# Patient Record
Sex: Female | Born: 1935
Health system: Southern US, Community
[De-identification: ages and names within clinical notes are randomized; demographics above are authoritative.]

## PROBLEM LIST (undated history)

## (undated) DIAGNOSIS — M1990 Unspecified osteoarthritis, unspecified site: Secondary | ICD-10-CM

## (undated) DIAGNOSIS — K5903 Drug induced constipation: Secondary | ICD-10-CM

## (undated) DIAGNOSIS — R0600 Dyspnea, unspecified: Secondary | ICD-10-CM

## (undated) DIAGNOSIS — C3490 Malignant neoplasm of unspecified part of unspecified bronchus or lung: Secondary | ICD-10-CM

## (undated) DIAGNOSIS — M810 Age-related osteoporosis without current pathological fracture: Secondary | ICD-10-CM

## (undated) DIAGNOSIS — D496 Neoplasm of unspecified behavior of brain: Secondary | ICD-10-CM

## (undated) DIAGNOSIS — D351 Benign neoplasm of parathyroid gland: Secondary | ICD-10-CM

## (undated) DIAGNOSIS — T402X5A Adverse effect of other opioids, initial encounter: Secondary | ICD-10-CM

## (undated) DIAGNOSIS — M8580 Other specified disorders of bone density and structure, unspecified site: Secondary | ICD-10-CM

## (undated) DIAGNOSIS — I10 Essential (primary) hypertension: Secondary | ICD-10-CM

## (undated) HISTORY — DX: Age-related osteoporosis without current pathological fracture: M81.0

## (undated) HISTORY — PX: GALLBLADDER SURGERY: SHX652

## (undated) HISTORY — DX: Benign neoplasm of parathyroid gland: D35.1

## (undated) HISTORY — DX: Essential (primary) hypertension: I10

## (undated) HISTORY — DX: Other specified disorders of bone density and structure, unspecified site: M85.80

## (undated) HISTORY — PX: WRIST FRACTURE SURGERY: SHX121

## (undated) HISTORY — DX: Unspecified osteoarthritis, unspecified site: M19.90

## (undated) HISTORY — DX: Neoplasm of unspecified behavior of brain: D49.6

## (undated) HISTORY — PX: LOBECTOMY: SHX5089

## (undated) HISTORY — PX: OTHER SURGICAL HISTORY: SHX169

## (undated) HISTORY — DX: Malignant neoplasm of unspecified part of unspecified bronchus or lung: C34.90

## (undated) HISTORY — PX: CATARACT EXTRACTION: SUR2

## (undated) HISTORY — PX: TONSILLECTOMY: SUR1361

---

## 2012-12-24 DIAGNOSIS — G4700 Insomnia, unspecified: Secondary | ICD-10-CM | POA: Insufficient documentation

## 2012-12-24 DIAGNOSIS — G4733 Obstructive sleep apnea (adult) (pediatric): Secondary | ICD-10-CM | POA: Insufficient documentation

## 2014-11-26 DIAGNOSIS — F09 Unspecified mental disorder due to known physiological condition: Secondary | ICD-10-CM | POA: Insufficient documentation

## 2014-11-26 DIAGNOSIS — Z90710 Acquired absence of both cervix and uterus: Secondary | ICD-10-CM | POA: Insufficient documentation

## 2014-11-26 DIAGNOSIS — G2581 Restless legs syndrome: Secondary | ICD-10-CM | POA: Insufficient documentation

## 2014-11-26 DIAGNOSIS — M8580 Other specified disorders of bone density and structure, unspecified site: Secondary | ICD-10-CM | POA: Insufficient documentation

## 2014-11-26 DIAGNOSIS — Z9079 Acquired absence of other genital organ(s): Secondary | ICD-10-CM | POA: Insufficient documentation

## 2015-11-10 DIAGNOSIS — M0638 Rheumatoid nodule, vertebrae: Secondary | ICD-10-CM | POA: Insufficient documentation

## 2016-07-18 DIAGNOSIS — E210 Primary hyperparathyroidism: Secondary | ICD-10-CM | POA: Insufficient documentation

## 2016-09-22 DIAGNOSIS — L821 Other seborrheic keratosis: Secondary | ICD-10-CM | POA: Insufficient documentation

## 2016-09-22 DIAGNOSIS — Z1283 Encounter for screening for malignant neoplasm of skin: Secondary | ICD-10-CM | POA: Insufficient documentation

## 2016-09-22 DIAGNOSIS — D229 Melanocytic nevi, unspecified: Secondary | ICD-10-CM | POA: Insufficient documentation

## 2017-03-16 DIAGNOSIS — L988 Other specified disorders of the skin and subcutaneous tissue: Secondary | ICD-10-CM | POA: Insufficient documentation

## 2017-04-12 DIAGNOSIS — E8941 Symptomatic postprocedural ovarian failure: Secondary | ICD-10-CM | POA: Insufficient documentation

## 2017-11-29 DIAGNOSIS — M1990 Unspecified osteoarthritis, unspecified site: Secondary | ICD-10-CM | POA: Insufficient documentation

## 2018-07-26 DIAGNOSIS — H903 Sensorineural hearing loss, bilateral: Secondary | ICD-10-CM | POA: Insufficient documentation

## 2019-06-10 DIAGNOSIS — R911 Solitary pulmonary nodule: Secondary | ICD-10-CM | POA: Insufficient documentation

## 2019-06-25 DIAGNOSIS — E785 Hyperlipidemia, unspecified: Secondary | ICD-10-CM | POA: Insufficient documentation

## 2019-06-25 DIAGNOSIS — E042 Nontoxic multinodular goiter: Secondary | ICD-10-CM | POA: Insufficient documentation

## 2019-07-14 DIAGNOSIS — K5903 Drug induced constipation: Secondary | ICD-10-CM | POA: Insufficient documentation

## 2020-03-12 LAB — LIPID PANEL
Cholesterol: 149 (ref 0–200)
HDL: 74 — AB (ref 35–70)
LDL Cholesterol: 61
Triglycerides: 74 (ref 40–160)

## 2020-03-12 LAB — CBC AND DIFFERENTIAL
HCT: 38 (ref 36–46)
Hemoglobin: 12.5 (ref 12.0–16.0)
Platelets: 152 (ref 150–399)
WBC: 4

## 2020-03-12 LAB — BASIC METABOLIC PANEL
BUN: 20 (ref 4–21)
CO2: 27 — AB (ref 13–22)
Chloride: 103 (ref 99–108)
Creatinine: 0.7 (ref 0.5–1.1)
Glucose: 179

## 2020-03-12 LAB — COMPREHENSIVE METABOLIC PANEL
Albumin: 4.3 (ref 3.5–5.0)
Calcium: 9.1 (ref 8.7–10.7)
GFR calc Af Amer: 83
GFR calc non Af Amer: 72
Globulin: 2.2

## 2020-03-12 LAB — HEPATIC FUNCTION PANEL
ALT: 18 (ref 7–35)
AST: 21 (ref 13–35)
Alkaline Phosphatase: 61 (ref 25–125)

## 2020-03-12 LAB — CBC: RBC: 4.19 (ref 3.87–5.11)

## 2020-08-17 ENCOUNTER — Ambulatory Visit: Payer: Self-pay | Attending: Internal Medicine

## 2020-08-17 DIAGNOSIS — Z23 Encounter for immunization: Secondary | ICD-10-CM

## 2020-08-17 NOTE — Progress Notes (Signed)
   Covid-19 Vaccination Clinic  Name:  Jacqueline Hunt    MRN: [id number] DOB: Jan 29, 1928  08/17/2020  [name] was observed post Covid-19 immunization for 15 minutes without incident. She was provided with Vaccine Information Sheet and instruction to access the V-Safe system.   [name] was instructed to call 911 with any severe reactions post vaccine: Marland Kitchen Difficulty breathing  . Swelling of face and throat  . A fast heartbeat  . A bad rash all over body  . Dizziness and weakness

## 2020-08-30 ENCOUNTER — Ambulatory Visit (INDEPENDENT_AMBULATORY_CARE_PROVIDER_SITE_OTHER): Payer: Medicare HMO | Admitting: Internal Medicine

## 2020-08-30 ENCOUNTER — Encounter: Payer: Self-pay | Admitting: Internal Medicine

## 2020-08-30 ENCOUNTER — Other Ambulatory Visit: Payer: Self-pay

## 2020-08-30 VITALS — BP 122/78 | HR 79 | Temp 97.5°F | Ht 61.0 in | Wt 106.0 lb

## 2020-08-30 DIAGNOSIS — M810 Age-related osteoporosis without current pathological fracture: Secondary | ICD-10-CM

## 2020-08-30 DIAGNOSIS — Z86018 Personal history of other benign neoplasm: Secondary | ICD-10-CM | POA: Insufficient documentation

## 2020-08-30 DIAGNOSIS — Z85118 Personal history of other malignant neoplasm of bronchus and lung: Secondary | ICD-10-CM

## 2020-08-30 DIAGNOSIS — I10 Essential (primary) hypertension: Secondary | ICD-10-CM

## 2020-08-30 DIAGNOSIS — I700 Atherosclerosis of aorta: Secondary | ICD-10-CM | POA: Diagnosis not present

## 2020-08-30 DIAGNOSIS — M8949 Other hypertrophic osteoarthropathy, multiple sites: Secondary | ICD-10-CM

## 2020-08-30 DIAGNOSIS — H401131 Primary open-angle glaucoma, bilateral, mild stage: Secondary | ICD-10-CM

## 2020-08-30 DIAGNOSIS — R296 Repeated falls: Secondary | ICD-10-CM | POA: Diagnosis not present

## 2020-08-30 DIAGNOSIS — R2681 Unsteadiness on feet: Secondary | ICD-10-CM

## 2020-08-30 DIAGNOSIS — M159 Polyosteoarthritis, unspecified: Secondary | ICD-10-CM

## 2020-08-30 DIAGNOSIS — M150 Primary generalized (osteo)arthritis: Secondary | ICD-10-CM

## 2020-08-30 DIAGNOSIS — Z8781 Personal history of (healed) traumatic fracture: Secondary | ICD-10-CM | POA: Insufficient documentation

## 2020-08-30 MED ORDER — ESTRADIOL 0.05 MG/24HR TD PTWK: 0.0500 mg | MEDICATED_PATCH | TRANSDERMAL | 4 refills | Status: DC

## 2020-08-30 NOTE — Patient Instructions (Signed)
Please bring me a copy of your living will and HCPOA documents.

## 2020-08-30 NOTE — Progress Notes (Signed)
Provider:  Rexene Edison. Mariea Clonts, D.O., C.M.D. Location:   [facility]  Place of Service:   clinic  Previous PCP:Dr.Wang at [facility] on [address] in [place], Rexene Edison, DO Patient Care Team: Gayland Curry, DO as PCP - General (Geriatric Medicine) Harlene Ramus (Podiatry)  Extended Emergency Contact Information Primary Emergency Contact: Sharnay, Cashion Work Phone: [phone] Mobile Phone: [phone] Relation: Son  Code Status: DNR, MOST--reports having and will bring copies Goals of Care: Advanced Directive information Advanced Directives 08/30/2020  Does Patient Have a Medical Advance Directive? Yes  Type of Advance Directive Out of facility DNR (pink MOST or yellow form);Healthcare Power of Attorney  Does patient want to make changes to medical advance directive? No - Patient declined  Copy of [place] in Chart? No - copy requested   Chief Complaint  Patient presents with  . Establish Care    New patient to establish care   . Health Maintenance    [facility]flu[facility] ([facility]) Dexa Scan   . Acute Visit    Referral for pt for balance and strenth     HPI: Patient is a 86 y.o. female seen today to establish with [facility].  Records have been requested from [place] PCP.  Just barely here 4 months.   She's had quite a year.  Her former husband (divorced since 49, father of her children) is going to hospice today--covid-related.  His wife of 29 yrs died.    Last 05-26-23, she had cancer of the RUL of her lung--had successful surgery and terrible recovery--two mos in hospital, another month in rehab.  Then fell.  She could not walk a few feet after her hospoitalization.  Had PT to walk in rehab.  She went up to [place] after rehab for 3 mos.  She fell again in her apt.  She got set back another month.  Each child visited her three times in [place] (has 3 kids).    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  She has come here for one child  (eldest son and DIL who is her Engineer, maintenance (IT) live here).  Also came for cost of living--lives in downtown at the [facility] in [place].    She does walk for exercise.  She admits to not having the strength, balance and breath she had before surgery.  She is interested in more PT.   She has a cat named Sasha who is 30 yo.  She should have some specialty f/us:  Oncologist  Her wrist has healed from her fracture. Dr. Roslyn Smiling did that surgery.     She had one spot show up on one test but not another.   It was on her last PET scan.  It was very expensive.  She says she would not do anything about it if something came up.   Report:  1. Status post right upper lobectomy with mildly FDG avid right apical soft tissue density, likely postsurgical inflammatory changes. 2. Mildly FDG-avid right lower lobe pleural based nodular consolidation, decreasing in size compared to prior CT, likely resolving infectious/inflammatory etiology.  3. No evidence of FDG-avid distant metastasis, there is no abnormal increased FDG uptake corresponding to the liver lesions of concern seen on outside contrast-enhanced CT. 4. Stable non-FDG-avid hypodense nodules in both thyroid lobes, recommend ultrasound correlation.  She is turning 89 11/30.  She's in good health outside of this past history.  She does  not want to spend the time and money on an expensive imaging for soemthing slow growing unless it meant she'd have terrible pain.  She does not want terrible pain.  [place] of thyroid in f/u was recommended on the radiology--appears they were stable on the PET scan.    She had a brain tumor in 2003--a meningioma, nonmalignant (was on right frontoparietal area)--healed well w/o complications.  Only thing was later when she was tested, there was one group of questions she could not answer them out loud, but she had the answers in her head and by the testing--suspected to be scar tissue from surgery.  Admits she forgets names and words like her friends  her age.    She has osteoporosis:  Says her bone density has shown osteopenia.  She did go down REALLY hard.  She uses an estrogen patch.  She had a TAH/BSO at 86 yo.  She had no estrogen afterward and at the time was on a pill at that time.  She's been getting weaned down to lowest patch possible.  She gets all of the symptoms if she stops it--got 24 hr hot flashes.  She does not want those at 59.    She's had arthritis since 77.  She was negative for rheumatoid arthritis.  They seldom hurt her.  Just occasionally and not terrible.  Has a right shoulder.    Glaucoma is new--began just before leaving [place].  She's been to Dr. Gershon Crane a couple of weeks ago.  Latanoprost has normalized her pressures.  She's also seen a dentist here.    Takes a single BP med--lisinopril.  She takes rosuvastatin for plaque on her aorta.  Cholesterol had been borderline.     She stopped vitamin C 2-3 wks ago.  She drinks OJ, has vitamin C in centrum.    [facility].    She had gotten down to 98 lbs after 3 mos of not eating due to nausea after the lung ca surgery, falls, rehab, stay in [place].  She saw her daughter's doctor in [place] and he suggested CBD.  It worked in a day and by a week, her appetite was normal.    Past Medical History:  Diagnosis Date  . Arthritis   . Brain tumor ([place])   . High blood pressure   . Lung cancer ([place])   . Osteopenia   . Osteoporosis    Past Surgical History:  Procedure Laterality Date  . GALLBLADDER SURGERY    . TONSILLECTOMY      Social History   Socioeconomic History  . Marital status: Divorced    Spouse name: Not on file  . Number of children: Not on file  . Years of education: Not on file  . Highest education level: Not on file  Occupational History  . Not on file  Tobacco Use  . Smoking status: Former Smoker    Packs/day: 1.00  . Smokeless tobacco: Never Used  Substance and Sexual Activity  . Alcohol use: Never   . Drug use: Never  . Sexual activity: Not Currently  Other Topics Concern  . Not on file  Social History Narrative   Diet: Nothing Special      Do you drink/ eat things with caffeine?  Coffe      Marital status:   Divorced  What year were you married ? 1951      Do you live in a house, apartment,assistred living, condo, trailer, etc.)? Apartment      Is it one or more stories? First Floor      How many persons live in your home ? One      Do you have any pets in your home ?(please list) Cat      Highest Level of education completed: PhD      Current or past profession:  Former Scientist, physiological + Professor , Social Work      Do you exercise?      Walk                        Type & how often  Just Power Walk Daily      ADVANCED DIRECTIVES (Please bring copies)      Do you have a living will? Yes      Do you have a DNR form?    Yes                   If not, do you want to discuss one?       Do you have signed POA?HPOA forms?  Yes               If so, please bring to your appointment      FUNCTIONAL STATUS- To be completed by Spouse / child / Staff       Do you have difficulty bathing or dressing yourself ? No      Do you have difficulty preparing food or eating ? No      Do you have difficulty managing your mediation ? No      Do you have difficulty managing your finances ? No      Do you have difficulty affording your medication ? No      Social Determinants of Health   Financial Resource Strain:   . Difficulty of Paying Living Expenses: Not on file  Food Insecurity:   . Worried About Charity fundraiser in the Last Year: Not on file  . Ran Out of Food in the Last Year: Not on file  Transportation Needs:   . Lack of Transportation (Medical): Not on file  . Lack of Transportation (Non-Medical): Not on file  Physical Activity:   . Days of Exercise per Week: Not on file  . Minutes of Exercise per Session: Not on file  Stress:   . Feeling of  Stress : Not on file  Social Connections:   . Frequency of Communication with Friends and Family: Not on file  . Frequency of Social Gatherings with Friends and Family: Not on file  . Attends Religious Services: Not on file  . Active Member of Clubs or Organizations: Not on file  . Attends Archivist Meetings: Not on file  . Marital Status: Not on file    reports that she has quit smoking. She smoked 1.00 pack per day. She has never used smokeless tobacco. She reports that she does not drink alcohol and does not use drugs.  Functional Status Survey:    History reviewed. No pertinent family history.  Health Maintenance  Topic Date Due  . DEXA SCAN  Never done  . INFLUENZA VACCINE  06/27/2020  . TETANUS/TDAP  11/30/2027  . COVID-19 Vaccine  Completed  . PNA vac Low Risk Adult  Completed    No  Known Allergies  Outpatient Encounter Medications as of 08/30/2020  Medication Sig  . aspirin EC 81 MG tablet Take 81 mg by mouth daily.  . Biotin 10000 MCG TABS Take 1 tablet by mouth daily.  . Cholecalciferol (VITAMIN D3 PO) Take 500 mg by mouth daily.  Marland Kitchen estradiol (CLIMARA - DOSED IN MG/24 HR) 0.05 mg/24hr patch Place 1 patch (0.05 mg total) onto the skin once a week.  . latanoprost (XALATAN) 0.005 % ophthalmic solution Place 1 drop into both eyes at bedtime.  Marland Kitchen lisinopril (ZESTRIL) 10 MG tablet Take 10 mg by mouth 2 (two) times daily.  . Multiple Vitamins-Minerals (DAILY MULTIVITAMIN PO) Take 1 tablet by mouth daily.  . rosuvastatin (CRESTOR) 5 MG tablet Take 5 mg by mouth every evening.  . [DISCONTINUED] estradiol (CLIMARA - DOSED IN MG/24 HR) 0.05 mg/24hr patch Place 0.05 mg onto the skin once a week.  . [DISCONTINUED] Multiple Vitamins-Minerals (EMERGEN-C IMMUNE PO) Take by mouth daily.   No facility-administered encounter medications on file as of 08/30/2020.    Review of Systems  Constitutional: Positive for weight loss.  HENT: Positive for hearing loss.         Dentures  Eyes: Negative for blurred vision.       Corrective lenses  Respiratory: Positive for shortness of breath.        Due to piece of lung removed  Cardiovascular: Negative for chest pain, palpitations and leg swelling.  Gastrointestinal: Negative for abdominal pain, blood in stool, constipation, diarrhea, melena, nausea and vomiting.  Genitourinary: Negative for dysuria.  Musculoskeletal: Positive for falls.       Poor balance  Skin:       Skin discoloration, age spots  Neurological: Negative for dizziness and loss of consciousness.  Psychiatric/Behavioral: Negative for depression and memory loss. The patient is not nervous/anxious and does not have insomnia.     Vitals:   08/30/20 1317  BP: 122/78  Pulse: 79  Temp: (!) 97.5 F (36.4 C)  TempSrc: Temporal  SpO2: 97%  Weight: 106 lb (48.1 kg)  Height: 5\' 1"  (1.549 m)   Body mass index is 20.03 kg/m. Physical Exam Vitals reviewed.  Constitutional:      General: She is not in acute distress.    Appearance: Normal appearance. She is not toxic-appearing.     Comments: Thin female, pleasant and talkative  HENT:     Head: Normocephalic and atraumatic.     Right Ear: External ear normal.     Left Ear: External ear normal.  Cardiovascular:     Rate and Rhythm: Normal rate and regular rhythm.     Pulses: Normal pulses.     Heart sounds: Normal heart sounds.  Pulmonary:     Effort: Pulmonary effort is normal.     Breath sounds: Normal breath sounds.     Comments: Diminished breath sounds on right Abdominal:     General: Bowel sounds are normal. There is no distension.     Palpations: Abdomen is soft.     Tenderness: There is no abdominal tenderness. There is no guarding or rebound.  Musculoskeletal:        General: Normal range of motion.     Cervical back: Neck supple.     Right lower leg: No edema.     Left lower leg: No edema.  Skin:    General: Skin is warm and dry.  Neurological:     General: No focal  deficit present.     Mental  Status: She is alert and oriented to person, place, and time.     Cranial Nerves: No cranial nerve deficit.     Sensory: No sensory deficit.     Motor: No weakness.     Coordination: Coordination normal.     Gait: Gait normal.     Deep Tendon Reflexes: Reflexes normal.     Comments: Still feels unsteady, does not use assistive device  Psychiatric:        Mood and Affect: Mood normal.        Behavior: Behavior normal.        Thought Content: Thought content normal.        Judgment: Judgment normal.     Labs reviewed: Basic Metabolic Panel: No results for input(s): NA, K, CL, CO2, GLUCOSE, BUN, CREATININE, CALCIUM, MG, PHOS in the last 8760 hours. Liver Function Tests: No results for input(s): AST, ALT, ALKPHOS, BILITOT, PROT, ALBUMIN in the last 8760 hours. No results for input(s): LIPASE, AMYLASE in the last 8760 hours. No results for input(s): AMMONIA in the last 8760 hours. CBC: No results for input(s): WBC, NEUTROABS, HGB, HCT, MCV, PLT in the last 8760 hours. Cardiac Enzymes: No results for input(s): CKTOTAL, CKMB, CKMBINDEX, TROPONINI in the last 8760 hours. BNP: Invalid input(s): POCBNP No results found for: HGBA1C No results found for: TSH No results found for: VITAMINB12 No results found for: FOLATE No results found for: IRON, TIBC, FERRITIN  Imaging and Procedures noted on new patient packet:  Stopped pap smears in 2015 Last colonoscopy 2015 Last mammogram 2015 CTs and MRIs 2018-2021 with Merian Capron Had tetanus shot within 10 yrs Had shingrix series 2019 Had covid shots 2021 Previous doctors:  Tammi Klippel Chochone (sp) oncology Andee Lineman (sp) dermatolgoy [facility]  Assessment/Plan 1. History of lung cancer - s/p RUL resection, had lung surgery and lymph nodes all negative -has had f/u PET as above in [place] -continued f/u imaging recommended though she says she won't do anything about it after  all she went through with the surgery - Ambulatory referral to Oncology to establish in [place], preferably someone who has a more palliative approach  2. Aortic atherosclerosis (HCC) - is on statin for this - CBC with Differential/Platelet - Basic metabolic panel - Hepatic function panel - Lipid panel  3. Recurrent falls - after her lung cancer surgery and while in rehab and then with family recovering -remains weaker and less balanced than before and requests to continue some outpatient PT here - Ambulatory referral to Physical Therapy  4. Senile osteoporosis -continue D3, walking, encouraged balance and some light weights, as well -located her bone density in care everywhere [facility] records:  02/05/19--osteopenia with T score -2.4; however, she has a prior wrist fracture so osteoporosis -will discuss prolia vs evenity for her in the future  5. History of wrist fracture -indicative of OP, see #4  6. Benign essential hypertension -bp controlled with ace, cont same and monitor  7. History of meningioma -was resected several years ago, did well after that  8. Primary osteoarthritis involving multiple joints -continue exercise, may use tylenol or topicals for pain  9. Primary open angle glaucoma (POAG) of both eyes, mild stage -pressures stabilizes with latanoprost, is established here in [place] for ophtho  10. Unsteady gait when walking - will cont outpatient PT - Ambulatory referral to Physical Therapy  Labs/tests ordered:   Orders Placed This Encounter  Procedures  . CBC with Differential/Platelet  .  Basic metabolic panel    Order Specific Question:   Has the patient fasted?    Answer:   Yes  . Hepatic function panel  . Lipid panel    Order Specific Question:   Has the patient fasted?    Answer:   Yes  . Ambulatory referral to Physical Therapy    Referral Priority:   Routine    Referral Type:   Physical Medicine    Referral Reason:   Specialty Services Required     Requested Specialty:   Physical Therapy    Number of Visits Requested:   1  . Ambulatory referral to Oncology    Referral Priority:   Routine    Referral Type:   Consultation    Referral Reason:   Specialty Services Required    Number of Visits Requested:   1     Chenoah Mcnally L. Leviathan Macera, D.O. [facility] [address], [place] [address] Cell Phone (Mon-Fri 8am-5pm):  [phone] On Call:  [phone] & follow prompts after 5pm & weekends Office Phone:  [phone] Office Fax:  [phone]

## 2020-08-31 ENCOUNTER — Encounter: Payer: Self-pay | Admitting: *Deleted

## 2020-08-31 DIAGNOSIS — Z85118 Personal history of other malignant neoplasm of bronchus and lung: Secondary | ICD-10-CM

## 2020-08-31 LAB — CBC WITH DIFFERENTIAL/PLATELET
Absolute Monocytes: 250 cells/uL (ref 200–950)
Basophils Absolute: 29 cells/uL (ref 0–200)
Basophils Relative: 0.7 %
Eosinophils Absolute: 41 cells/uL (ref 15–500)
Eosinophils Relative: 1 %
HCT: 40.9 % (ref 35.0–45.0)
Hemoglobin: 13.7 g/dL (ref 11.7–15.5)
Lymphs Abs: 1275 cells/uL (ref 850–3900)
MCH: 30.5 pg (ref 27.0–33.0)
MCHC: 33.5 g/dL (ref 32.0–36.0)
MCV: 91.1 fL (ref 80.0–100.0)
MPV: 10.9 fL (ref 7.5–12.5)
Monocytes Relative: 6.1 %
Neutro Abs: 2505 cells/uL (ref 1500–7800)
Neutrophils Relative %: 61.1 %
Platelets: 173 10*3/uL (ref 140–400)
RBC: 4.49 10*6/uL (ref 3.80–5.10)
RDW: 12.6 % (ref 11.0–15.0)
Total Lymphocyte: 31.1 %
WBC: 4.1 10*3/uL (ref 3.8–10.8)

## 2020-08-31 LAB — HEPATIC FUNCTION PANEL
AG Ratio: 1.7 (calc) (ref 1.0–2.5)
ALT: 14 U/L (ref 6–29)
AST: 19 U/L (ref 10–35)
Albumin: 4.4 g/dL (ref 3.6–5.1)
Alkaline phosphatase (APISO): 57 U/L (ref 37–153)
Bilirubin, Direct: 0.1 mg/dL (ref 0.0–0.2)
Globulin: 2.6 g/dL (calc) (ref 1.9–3.7)
Indirect Bilirubin: 0.4 mg/dL (calc) (ref 0.2–1.2)
Total Bilirubin: 0.5 mg/dL (ref 0.2–1.2)
Total Protein: 7 g/dL (ref 6.1–8.1)

## 2020-08-31 LAB — BASIC METABOLIC PANEL
BUN: 18 mg/dL (ref 7–25)
CO2: 29 mmol/L (ref 20–32)
Calcium: 9.8 mg/dL (ref 8.6–10.4)
Chloride: 102 mmol/L (ref 98–110)
Creat: 0.67 mg/dL (ref 0.60–0.88)
Glucose, Bld: 80 mg/dL (ref 65–99)
Potassium: 4 mmol/L (ref 3.5–5.3)
Sodium: 140 mmol/L (ref 135–146)

## 2020-08-31 LAB — LIPID PANEL
Cholesterol: 164 mg/dL (ref <200)
HDL: 78 mg/dL (ref >=50)
LDL Cholesterol (Calc): 71 mg/dL (calc)
Non-HDL Cholesterol (Calc): 86 mg/dL (calc) (ref <130)
Total CHOL/HDL Ratio: 2.1 (calc) (ref <5.0)
Triglycerides: 70 mg/dL (ref <150)

## 2020-08-31 NOTE — Progress Notes (Signed)
I received referral on Jacqueline Hunt today.  She does not have any recent imaging or pathology.  She does have a hx of lung cancer.  I notified scheduling to call and schedule patient with Dr. Julien Nordmann in 2 weeks.

## 2020-08-31 NOTE — Progress Notes (Signed)
Jan's labs are amazing.  Nothing lit up as abnormal at all!  (sent in [place])

## 2020-09-01 ENCOUNTER — Telehealth: Payer: Self-pay | Admitting: Internal Medicine

## 2020-09-01 NOTE — Telephone Encounter (Signed)
Received a new hem referral from Dr. Mariea Clonts at [facility] for hx of lung cancer. Jacqueline Hunt has been cld and scheduled to see Dr. Julien Nordmann on 10/26 at 2;15pm w/labs at 1:45pm.

## 2020-09-03 ENCOUNTER — Telehealth: Payer: Self-pay | Admitting: *Deleted

## 2020-09-03 NOTE — Telephone Encounter (Signed)
Left vm message to call

## 2020-09-15 ENCOUNTER — Telehealth: Payer: Self-pay | Admitting: Medical Oncology

## 2020-09-15 ENCOUNTER — Encounter: Payer: Self-pay | Admitting: *Deleted

## 2020-09-15 NOTE — Telephone Encounter (Signed)
PET scan going out this afternoon by [employer] Tracking number [id number].

## 2020-09-15 NOTE — Progress Notes (Signed)
I called oncology center in [facility] to request CD of recent images.  I was told they will request this to be sent out today.  It will be sent to [facility] attention Dr. Julien Nordmann.

## 2020-09-16 ENCOUNTER — Other Ambulatory Visit: Payer: Self-pay

## 2020-09-16 ENCOUNTER — Ambulatory Visit: Payer: Medicare HMO | Attending: Internal Medicine

## 2020-09-16 DIAGNOSIS — M6281 Muscle weakness (generalized): Secondary | ICD-10-CM | POA: Insufficient documentation

## 2020-09-16 DIAGNOSIS — R2689 Other abnormalities of gait and mobility: Secondary | ICD-10-CM | POA: Diagnosis present

## 2020-09-16 DIAGNOSIS — R262 Difficulty in walking, not elsewhere classified: Secondary | ICD-10-CM | POA: Diagnosis not present

## 2020-09-17 NOTE — Therapy (Signed)
[place], [place], [address] Phone: [phone]   Fax:  [phone]  Physical Therapy Evaluation  Patient Details  Name: Jacqueline Hunt MRN: [id number] Date of Birth: July 26, 1928 Referring Provider (PT): Gayland Curry, DO   Encounter Date: 09/16/2020   PT End of Session - 09/17/20 1352    Visit Number 1    Number of Visits 9    Date for PT Re-Evaluation 11/26/20    Authorization Type AETNA MEDICARE HMO/PPO    Progress Note Due on Visit 10    PT Start Time 1400    PT Stop Time 1449    PT Time Calculation (min) 49 min    Activity Tolerance Patient tolerated treatment well    Behavior During Therapy [facility] for tasks assessed/performed           Past Medical History:  Diagnosis Date  . Arthritis   . Brain tumor ([place])   . High blood pressure   . Lung cancer ([place])   . Osteopenia   . Osteoporosis     Past Surgical History:  Procedure Laterality Date  . GALLBLADDER SURGERY    . TONSILLECTOMY      There were no vitals filed for this visit.    Subjective Assessment - 09/17/20 1338    Subjective Pt reports reports having a R upper lobe lung resection 8/20 and went through a long recovery process the following year. Currently, she feels she is losing ground re: her balance and functional abilities. Pt is walking daily as tolerated and has not had a fall in the past 6 months. She is concerned about notfeeling steady with turning and prolonged standing tolerance.    Pertinent History R upper lobe lung resection-CA    How long can you sit comfortably? Not na issue    How long can you stand comfortably? 5 mins    How long can you walk comfortably? 30 mins    Patient Stated Goals To improve balance and to feel more steady on her feet    Currently in Pain? No/denies   only genral body aches for a 86yo             [facility] PT Assessment - 09/17/20 0001      Assessment   Medical Diagnosis Recurrent  falls; Unsteady gait when walking    Referring Provider (PT) Gayland Curry, DO    Onset Date/Surgical Date 06/30/19    Hand Dominance Right    Prior Therapy yes      Precautions   Precautions Fall      Restrictions   Weight Bearing Restrictions No      Balance Screen   Has the patient fallen in the past 6 months No    Has the patient had a decrease in activity level because of a fear of falling?  No    Is the patient reluctant to leave their home because of a fear of falling?  No      Home Environment   Living Environment Private residence    Living Arrangements Alone    Type of [place] to enter    Entrance Stairs-Number of Steps 2    Entrance Stairs-Rails Right    Home Layout One level      Prior Function   Level of [place] Retired      Associate Professor   Overall Cognitive Status Within Functional Limits for  tasks assessed      Observation/Other Assessments   Focus on Therapeutic Outcomes (FOTO)  55% fuctional ability      Sensation   Light Touch Appears Intact      Posture/Postural Control   Posture/Postural Control Postural limitations    Postural Limitations Anterior pelvic tilt      ROM / Strength   AROM / PROM / Strength AROM;Strength      AROM   Overall AROM Comments Pt demonstrates AROM for the hips, knees, and ankles WNLs      Strength   Strength Assessment Site Hip;Knee    Right/Left Hip Right;Left    Right Hip Flexion 4/5    Right Hip Extension 3/5    Right Hip External Rotation  3/5    Right Hip Internal Rotation 3+/5    Right Hip ABduction 3/5    Right Hip ADduction 4+/5    Left Hip Flexion 3/5    Left Hip Extension 3/5    Left Hip External Rotation 3/5    Left Hip Internal Rotation 3+/5    Left Hip ABduction 3/5    Left Hip ADduction 4+/5    Right/Left Knee Right;Left    Right Knee Flexion 4/5    Right Knee Extension 4+/5    Left Knee Flexion 4/5    Left Knee Extension 4+/5       Transfers   Transfers Sit to Stand;Stand to Sit    Sit to Stand 7: Independent    Five time sit to stand comments  10.93      Ambulation/Gait   Gait Pattern Step-through pattern    Gait velocity 2MWT= 354ft                      Objective measurements completed on examination: See above findings.               PT Education - 09/17/20 1347    Education Details Eval findings, POC, initial HEP and progressive walking program increasing 5 min every 3 to 4 days as tolerated.    Person(s) Educated Patient    Methods Explanation;Demonstration;Tactile cues;Verbal cues;Handout    Comprehension Verbalized understanding;Returned demonstration;Verbal cues required;Tactile cues required            PT Short Term Goals - 09/17/20 1413      PT SHORT TERM GOAL #1   Title Pt will be Ind in an initial HEP    Baseline Started on eval    Status New    Target Date 10/08/20      PT SHORT TERM GOAL #2   Title Pt will voice understanding of fall prevention principles    Status New    Target Date 10/08/20             PT Long Term Goals - 09/17/20 1421      PT LONG TERM GOAL #1   Title Increased 2MWT distance to MCD of 360ft    Baseline 311ft    Status New    Target Date 11/26/20      PT LONG TERM GOAL #2   Title Improve 5xSTS to 9.5 sec or less    Baseline 10.9 sec    Status New    Target Date 11/26/20      PT LONG TERM GOAL #3   Title Pt will reports a little difficulty c asc. 2 flights of steps    Baseline Alot of difficulty    Status New  Target Date 11/26/20      PT LONG TERM GOAL #4   Title Pt's FOTO score will improve to predicted 57% functional ability    Baseline 55%    Status New    Target Date 11/26/20      PT LONG TERM GOAL #5   Title Improve bilat hip abd and ext strength to at least 4/5    Baseline 3/5    Status New    Target Date 11/26/20                  Plan - 09/17/20 1402    Clinical Impression Statement Pt presents  with relatively good ambulation and functional mobility for her age. Pt demonstrates decreased strength of her hip abd and extensors, and presents an ant. pelvic tilt. Pt perceives her strength, balance, standing and walking tolerances are decreasing. Pt will benefit from trunk, abdominal, and LE strengthening, balance activities, and enduanc eactivities to improve functional mobility an safety.    Personal Factors and Comorbidities Age;Comorbidity 3+    Comorbidities brain tumor, lung CA c resection, arthritis    Examination-Activity Limitations Locomotion Level;Stairs;Stand    Stability/Clinical Decision Making Stable/Uncomplicated    Clinical Decision Making Low    Rehab Potential Good    PT Frequency 1x / week    PT Duration 8 weeks    PT Treatment/Interventions Gait training;Stair training;Functional mobility training;Therapeutic activities;Therapeutic exercise;Balance training;Neuromuscular re-education;Patient/family education;Energy conservation;Taping;Manual techniques    PT Next Visit Plan Complete Berg and set LTG. Assess response to HEP and walking program    PT Home Exercise Plan [email or web address]    Consulted and Agree with Plan of Care Patient           Patient will benefit from skilled therapeutic intervention in order to improve the following deficits and impairments:  Difficulty walking, Decreased endurance, Decreased strength, Decreased balance, Decreased activity tolerance  Visit Diagnosis: Difficulty in walking, not elsewhere classified  Muscle weakness (generalized)  Balance problems     Problem List Patient Active Problem List   Diagnosis Date Noted  . History of lung cancer 08/30/2020  . Aortic atherosclerosis ([place]) 08/30/2020  . Recurrent falls 08/30/2020  . Senile osteoporosis 08/30/2020  . History of wrist fracture 08/30/2020  . Benign essential hypertension 08/30/2020  . History of meningioma 08/30/2020  . Primary osteoarthritis involving multiple joints  08/30/2020  . Primary open angle glaucoma (POAG) of both eyes, mild stage 08/30/2020   Gar Ponto MS, PT 09/17/20 2:31 PM  [facility] [address] [place], [place], [address] Phone: [phone]   Fax:  [phone]  Name: Jacqueline Hunt MRN: [id number] Date of Birth: 1928/10/28

## 2020-09-21 ENCOUNTER — Inpatient Hospital Stay: Payer: Medicare HMO | Attending: Internal Medicine | Admitting: Internal Medicine

## 2020-09-21 ENCOUNTER — Other Ambulatory Visit: Payer: Self-pay

## 2020-09-21 ENCOUNTER — Encounter: Payer: Self-pay | Admitting: Internal Medicine

## 2020-09-21 ENCOUNTER — Telehealth: Payer: Self-pay | Admitting: Internal Medicine

## 2020-09-21 ENCOUNTER — Inpatient Hospital Stay: Payer: Medicare HMO

## 2020-09-21 VITALS — BP 142/93 | HR 87 | Temp 97.5°F | Resp 18 | Ht 61.0 in | Wt 107.5 lb

## 2020-09-21 DIAGNOSIS — C3411 Malignant neoplasm of upper lobe, right bronchus or lung: Secondary | ICD-10-CM | POA: Diagnosis not present

## 2020-09-21 DIAGNOSIS — Z79899 Other long term (current) drug therapy: Secondary | ICD-10-CM

## 2020-09-21 DIAGNOSIS — R5383 Other fatigue: Secondary | ICD-10-CM | POA: Diagnosis not present

## 2020-09-21 DIAGNOSIS — K5900 Constipation, unspecified: Secondary | ICD-10-CM | POA: Diagnosis not present

## 2020-09-21 DIAGNOSIS — E785 Hyperlipidemia, unspecified: Secondary | ICD-10-CM

## 2020-09-21 DIAGNOSIS — I10 Essential (primary) hypertension: Secondary | ICD-10-CM

## 2020-09-21 DIAGNOSIS — C3490 Malignant neoplasm of unspecified part of unspecified bronchus or lung: Secondary | ICD-10-CM

## 2020-09-21 DIAGNOSIS — C3491 Malignant neoplasm of unspecified part of right bronchus or lung: Secondary | ICD-10-CM | POA: Insufficient documentation

## 2020-09-21 DIAGNOSIS — M1990 Unspecified osteoarthritis, unspecified site: Secondary | ICD-10-CM | POA: Diagnosis not present

## 2020-09-21 DIAGNOSIS — Z87891 Personal history of nicotine dependence: Secondary | ICD-10-CM

## 2020-09-21 DIAGNOSIS — Z808 Family history of malignant neoplasm of other organs or systems: Secondary | ICD-10-CM | POA: Diagnosis not present

## 2020-09-21 DIAGNOSIS — M6281 Muscle weakness (generalized): Secondary | ICD-10-CM

## 2020-09-21 DIAGNOSIS — Z85118 Personal history of other malignant neoplasm of bronchus and lung: Secondary | ICD-10-CM

## 2020-09-21 LAB — CMP (CANCER CENTER ONLY)
ALT: 15 U/L (ref 0–44)
AST: 18 U/L (ref 15–41)
Albumin: 4.1 g/dL (ref 3.5–5.0)
Alkaline Phosphatase: 59 U/L (ref 38–126)
Anion gap: 8 (ref 5–15)
BUN: 19 mg/dL (ref 8–23)
CO2: 28 mmol/L (ref 22–32)
Calcium: 9.5 mg/dL (ref 8.9–10.3)
Chloride: 104 mmol/L (ref 98–111)
Creatinine: 0.76 mg/dL (ref 0.44–1.00)
GFR, Estimated: >60 mL/min (ref >60)
Glucose, Bld: 98 mg/dL (ref 70–99)
Potassium: 3.9 mmol/L (ref 3.5–5.1)
Sodium: 140 mmol/L (ref 135–145)
Total Bilirubin: 0.5 mg/dL (ref 0.3–1.2)
Total Protein: 6.9 g/dL (ref 6.5–8.1)

## 2020-09-21 LAB — CBC WITH DIFFERENTIAL (CANCER CENTER ONLY)
Abs Immature Granulocytes: 0 10*3/uL (ref 0.00–0.07)
Basophils Absolute: 0 10*3/uL (ref 0.0–0.1)
Basophils Relative: 1 %
Eosinophils Absolute: 0.1 10*3/uL (ref 0.0–0.5)
Eosinophils Relative: 1 %
HCT: 39.4 % (ref 36.0–46.0)
Hemoglobin: 12.7 g/dL (ref 12.0–15.0)
Immature Granulocytes: 0 %
Lymphocytes Relative: 33 %
Lymphs Abs: 1.4 10*3/uL (ref 0.7–4.0)
MCH: 29.8 pg (ref 26.0–34.0)
MCHC: 32.2 g/dL (ref 30.0–36.0)
MCV: 92.5 fL (ref 80.0–100.0)
Monocytes Absolute: 0.3 10*3/uL (ref 0.1–1.0)
Monocytes Relative: 7 %
Neutro Abs: 2.4 10*3/uL (ref 1.7–7.7)
Neutrophils Relative %: 58 %
Platelet Count: 150 10*3/uL (ref 150–400)
RBC: 4.26 MIL/uL (ref 3.87–5.11)
RDW: 13.2 % (ref 11.5–15.5)
WBC Count: 4.2 10*3/uL (ref 4.0–10.5)
nRBC: 0 % (ref 0.0–0.2)

## 2020-09-21 NOTE — Telephone Encounter (Signed)
Thanks for the update

## 2020-09-21 NOTE — Progress Notes (Signed)
[place] [phone]   [phone]  CONSULT NOTE  REFERRING PHYSICIAN: Dr. Hollace Kinnier  REASON FOR CONSULTATION:  86 years old white female with history of lung cancer.  HPI Jacqueline Hunt is a 86 y.o. female has medical history significant for osteoarthritis, meningioma post brain surgery in 2003, hypertension, osteoporosis as well as dyslipidemia and history of stage Ib non-small cell lung cancer, adenocarcinoma.  The patient mentioned that she was doing annual physical exam with cardiologist and MRI of the chest performed on April 30, 2019 showed right upper lobe opacity.  This was followed by CT scan of the chest on 05/09/2019 and revealed a mass like consolidation measuring 0.8 x 1.7 cm with multiple additional nodules bilaterally noted.  The patient had a PET scan on 05/16/2019 and it revealed a right upper lobe apical segment hypermetabolic nodule measuring 4.0 x 2.2 cm.  She was referred to cardiothoracic surgery and underwent robotic right upper lobectomy with lymph node dissection on June 30, 2019 the final pathology was consistent with adenocarcinoma, acinar predominant with lipidic pattern measuring 3.8 cm.  The visceral pleura was not involved by carcinoma and there was no angiolymphatic invasion identified.  The resection margins were negative for malignancy and the dissected lymph nodes were negative for malignancy.  Tested for EGFR mutation and it was negative.  She also has negative PD-L1 expression.  Her surgical procedure was complicated with prolonged hospitalization for almost 2 months likely with small bowel ileus and obstruction as well as significant fatigue requiring rehabilitation.  After improvement of her condition the patient had repeat PET scan and early January 2021 that was negative for disease recurrence or metastasis.  She did not have any further imaging studies since that time.  She spent 3 months with her daughter in [place] and  also underwent rehabilitation at that time. She moved recently to [facility] to be close to her son and her daughter-in-law.  The patient also scheduled to undergo physical therapy in [place]. When seen today she is feeling fine with no concerning complaints except for the generalized weakness and occasional constipation.  She denied having any chest pain, shortness of breath, cough or hemoptysis.  She denied having any nausea, vomiting, abdominal pain or diarrhea.  She denied having any headache or visual changes. Family history significant for mother died in an accident at home age 71.  Father died from suicide age 73.  She had brother who died for metastatic bone cancer at age 28. The patient is divorced and has 3 children.  She recently moved to [place] to live with her son and her daughter-in-law.  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  She has a history of smoking 1 pack/day for around 15 years but quit when she was 86 years old.  She has no history of alcohol or drug abuse. HPI  Past Medical History:  Diagnosis Date  . Arthritis   . Brain tumor ([place])   . High blood pressure   . Lung cancer ([place])   . Osteopenia   . Osteoporosis     Past Surgical History:  Procedure Laterality Date  . GALLBLADDER SURGERY    . TONSILLECTOMY      No family history on file.  Social History Social History   Tobacco Use  . Smoking status: Former Smoker    Packs/day: 1.00  . Smokeless tobacco: Never Used  Substance Use Topics  . Alcohol  use: Never  . Drug use: Never    No Known Allergies  Current Outpatient Medications  Medication Sig Dispense Refill  . aspirin EC 81 MG tablet Take 81 mg by mouth daily.    . Biotin 10000 MCG TABS Take 1 tablet by mouth daily.    . Cholecalciferol (VITAMIN D3 PO) Take 500 mg by mouth daily.    Marland Kitchen estradiol (CLIMARA - DOSED IN MG/24 HR) 0.05 mg/24hr patch Place 1 patch (0.05 mg total) onto the skin once a week. 4  patch 4  . latanoprost (XALATAN) 0.005 % ophthalmic solution Place 1 drop into both eyes at bedtime.    Marland Kitchen lisinopril (ZESTRIL) 10 MG tablet Take 10 mg by mouth 2 (two) times daily.    . Multiple Vitamins-Minerals (DAILY MULTIVITAMIN PO) Take 1 tablet by mouth daily.    . rosuvastatin (CRESTOR) 5 MG tablet Take 5 mg by mouth every evening.     No current facility-administered medications for this visit.    Review of Systems  Constitutional: positive for fatigue Eyes: negative Ears, nose, mouth, throat, and face: negative Respiratory: negative Cardiovascular: negative Gastrointestinal: negative Genitourinary:negative Integument/breast: negative Hematologic/lymphatic: negative Musculoskeletal:positive for muscle weakness Neurological: negative Behavioral/Psych: negative Endocrine: negative Allergic/Immunologic: negative  Physical Exam  LDJ:TTSVX, healthy, no distress, well nourished and well developed SKIN: skin color, texture, turgor are normal, no rashes or significant lesions HEAD: Normocephalic, No masses, lesions, tenderness or abnormalities EYES: normal, PERRLA, Conjunctiva are pink and non-injected EARS: External ears normal, Canals clear OROPHARYNX:no exudate, no erythema and lips, buccal mucosa, and tongue normal  NECK: supple, no adenopathy, no JVD LYMPH:  no palpable lymphadenopathy, no hepatosplenomegaly BREAST:not examined LUNGS: clear to auscultation , and palpation HEART: regular rate & rhythm and no murmurs ABDOMEN:abdomen soft, non-tender, normal bowel sounds and no masses or organomegaly BACK: Back symmetric, no curvature., No CVA tenderness EXTREMITIES:no joint deformities, effusion, or inflammation, no edema  NEURO: alert & oriented x 3 with fluent speech, no focal motor/sensory deficits  PERFORMANCE STATUS: ECOG 1  LABORATORY DATA: Lab Results  Component Value Date   WBC 4.2 09/21/2020   HGB 12.7 09/21/2020   HCT 39.4 09/21/2020   MCV 92.5  09/21/2020   PLT 150 09/21/2020      Chemistry      Component Value Date/Time   NA 140 08/30/2020 1512   K 4.0 08/30/2020 1512   CL 102 08/30/2020 1512   CO2 29 08/30/2020 1512   BUN 18 08/30/2020 1512   CREATININE 0.67 08/30/2020 1512      Component Value Date/Time   CALCIUM 9.8 08/30/2020 1512   AST 19 08/30/2020 1512   ALT 14 08/30/2020 1512   BILITOT 0.5 08/30/2020 1512       RADIOGRAPHIC STUDIES: No results found.  ASSESSMENT: This is a very pleasant 86 years old white female diagnosed with stage Ib (T2 a, N0, M0) non-small cell lung cancer, adenocarcinoma predominantly acinar with lipidic features diagnosed in August 2020 status post right upper lobectomy with lymph node dissection in [place]). Molecular studies showed negative for EGFR mutation and negative PD-L1 expression.   PLAN: I had a lengthy discussion with the patient today about her current disease stage, prognosis and treatment options. The patient did not receive any adjuvant therapy which is the standard of care. Her last imaging studies was in January 2021.  Not show any evidence for disease recurrence or metastasis. I recommended for the patient to have repeat CT scan of the chest  next week for restaging of her disease. If the scan showed no concerning findings for disease recurrence or metastasis, I will see her back for follow-up visit in 6 months with repeat CT scan of the chest. After 2 years of close follow-up, we may start seeing the patient on annual basis. The patient will continue with physical therapy for rehabilitation.  It was ordered by her primary care physician. The patient was advised to call immediately if she has any concerning symptoms in the interval.  The patient voices understanding of current disease status and treatment options and is in agreement with the current care plan.  All questions were answered. The patient knows to call the clinic with any problems, questions  or concerns. We can certainly see the patient much sooner if necessary.  Thank you so much for allowing me to participate in the care of Jacqueline Hunt. I will continue to follow up the patient with you and assist in her care.   The total time spent in the appointment was 70 minutes.  Disclaimer: This note was dictated with voice recognition software. Similar sounding words can inadvertently be transcribed and may not be corrected upon review.   Eilleen Kempf September 21, 2020, 2:40 PM

## 2020-09-21 NOTE — Telephone Encounter (Signed)
Scheduled appointments per 10/26 los. Spoke to patient who is aware of appointment date and time.

## 2020-09-23 ENCOUNTER — Other Ambulatory Visit: Payer: Self-pay

## 2020-09-23 ENCOUNTER — Ambulatory Visit: Payer: Medicare HMO | Admitting: Physical Therapy

## 2020-09-23 ENCOUNTER — Encounter: Payer: Self-pay | Admitting: Physical Therapy

## 2020-09-23 DIAGNOSIS — R262 Difficulty in walking, not elsewhere classified: Secondary | ICD-10-CM

## 2020-09-23 DIAGNOSIS — R2689 Other abnormalities of gait and mobility: Secondary | ICD-10-CM

## 2020-09-23 DIAGNOSIS — M6281 Muscle weakness (generalized): Secondary | ICD-10-CM

## 2020-09-23 NOTE — Therapy (Signed)
[place], [place], [address] Phone: [phone]   Fax:  [phone]  Physical Therapy Treatment  Patient Details  Name: Jacqueline Hunt MRN: [id number] Date of Birth: [birth year] Referring Provider (PT): Gayland Curry, DO   Encounter Date: 09/23/2020   PT End of Session - 09/23/20 1413    Visit Number 2    Number of Visits 9    Date for PT Re-Evaluation 11/26/20    Authorization Type AETNA MEDICARE HMO/PPO    PT Start Time 4270    PT Stop Time 1457    PT Time Calculation (min) 44 min    Activity Tolerance Patient tolerated treatment well    Behavior During Therapy [facility] for tasks assessed/performed           Past Medical History:  Diagnosis Date  . Arthritis   . Brain tumor ([place])   . High blood pressure   . Lung cancer ([place])   . Osteopenia   . Osteoporosis     Past Surgical History:  Procedure Laterality Date  . GALLBLADDER SURGERY    . TONSILLECTOMY      There were no vitals filed for this visit.   Subjective Assessment - 09/23/20 1430    Subjective pain is in my Right arm, insidious onset over last 4 days.    Currently in Pain? Yes    Pain Score 4     Pain Location Arm    Pain Orientation Right    Pain Descriptors / Indicators Sore    Aggravating Factors  unsure    Pain Relieving Factors rest              [facility] PT Assessment - 09/23/20 0001      Standardized Balance Assessment   Standardized Balance Assessment Berg Balance Test      Berg Balance Test   Sit to Stand Able to stand without using hands and stabilize independently    Standing Unsupported Able to stand 2 minutes with supervision    Sitting with Back Unsupported but Feet Supported on Floor or Stool Able to sit safely and securely 2 minutes    Stand to Sit Sits safely with minimal use of hands    Transfers Able to transfer safely, definite need of hands    Standing Unsupported with Eyes Closed Able to stand 10  seconds with supervision    Standing Unsupported with Feet Together Able to place feet together independently and stand for 1 minute with supervision    From Standing, Reach Forward with Outstretched Arm Can reach forward >12 cm safely (5")    From Standing Position, Pick up Object from Floor Able to pick up shoe, needs supervision    From Standing Position, Turn to Look Behind Over each Shoulder Looks behind one side only/other side shows less weight shift    Turn 360 Degrees Able to turn 360 degrees safely but slowly    Standing Unsupported, Alternately Place Feet on Step/Stool Able to stand independently and complete 8 steps >20 seconds    Standing Unsupported, One Foot in Front Needs help to step but can hold 15 seconds    Standing on One Leg Tries to lift leg/unable to hold 3 seconds but remains standing independently    Total Score 40                         [facility] Adult PT Treatment/Exercise - 09/23/20 0001  Exercises   Exercises Knee/Hip      Knee/Hip Exercises: Standing   Other Standing Knee Exercises wide tandem with head turns      Knee/Hip Exercises: Seated   Sit to Sand 10 reps;without UE support   chair taps     Knee/Hip Exercises: Supine   Bridges with Ball Squeeze 20 reps;Strengthening      Knee/Hip Exercises: Sidelying   Hip ABduction Strengthening;Both;15 reps                  PT Education - 09/23/20 1458    Education Details BERG & score, trekking poles, importance of exercise    Person(s) Educated Patient    Methods Explanation;Handout;Verbal cues    Comprehension Verbalized understanding;Verbal cues required;Need further instruction            PT Short Term Goals - 09/17/20 1413      PT SHORT TERM GOAL #1   Title Pt will be Ind in an initial HEP    Baseline Started on eval    Status New    Target Date 10/08/20      PT SHORT TERM GOAL #2   Title Pt will voice understanding of fall prevention principles    Status New     Target Date 10/08/20             PT Long Term Goals - 09/17/20 1421      PT LONG TERM GOAL #1   Title Increased 2MWT distance to MCD of 325ft    Baseline 366ft    Status New    Target Date 11/26/20      PT LONG TERM GOAL #2   Title Improve 5xSTS to 9.5 sec or less    Baseline 10.9 sec    Status New    Target Date 11/26/20      PT LONG TERM GOAL #3   Title Pt will reports a little difficulty c asc. 2 flights of steps    Baseline Alot of difficulty    Status New    Target Date 11/26/20      PT LONG TERM GOAL #4   Title Pt's FOTO score will improve to predicted 57% functional ability    Baseline 55%    Status New    Target Date 11/26/20      PT LONG TERM GOAL #5   Title Improve bilat hip abd and ext strength to at least 4/5    Baseline 3/5    Status New    Target Date 11/26/20                 Plan - 09/23/20 1605    Clinical Impression Statement Pt scored 40/56 on BERG so we discussed the use of an AD for safety- cane v trekking poles. Pt is very nervous about her balance and is unstable but was able to independently correct any LOB today. Added table exercises to HEP to increase strength and progress to standing as the standing ones were painful to her hips.    PT Treatment/Interventions Gait training;Stair training;Functional mobility training;Therapeutic activities;Therapeutic exercise;Balance training;Neuromuscular re-education;Patient/family education;Energy conservation;Taping;Manual techniques    PT Next Visit Plan NBOS & single leg balance control, stepping over obstacles, gait with head turns    PT Home Exercise Plan [email or web address]    Consulted and Agree with Plan of Care Patient           Patient will benefit from skilled therapeutic intervention in order to improve the following deficits  and impairments:  Difficulty walking, Decreased endurance, Decreased strength, Decreased balance, Decreased activity tolerance  Visit Diagnosis: Difficulty in walking,  not elsewhere classified  Muscle weakness (generalized)  Balance problems     Problem List Patient Active Problem List   Diagnosis Date Noted  . Adenocarcinoma of right lung, stage 1 ([place]) 09/21/2020  . History of lung cancer 08/30/2020  . Aortic atherosclerosis ([place]) 08/30/2020  . Recurrent falls 08/30/2020  . Senile osteoporosis 08/30/2020  . History of wrist fracture 08/30/2020  . Benign essential hypertension 08/30/2020  . History of meningioma 08/30/2020  . Primary osteoarthritis involving multiple joints 08/30/2020  . Primary open angle glaucoma (POAG) of both eyes, mild stage 08/30/2020    Safiyah Cisney C. Akhilesh Sassone PT, DPT 09/23/20 4:08 PM   [facility] [address] [place], [place], [address] Phone: [phone]   Fax:  [phone]  Name: Jacqueline Hunt MRN: [id number] Date of Birth: Apr 01, 1928

## 2020-09-27 ENCOUNTER — Other Ambulatory Visit: Payer: Self-pay

## 2020-09-27 ENCOUNTER — Ambulatory Visit: Payer: Medicare HMO | Attending: Internal Medicine

## 2020-09-27 DIAGNOSIS — R2689 Other abnormalities of gait and mobility: Secondary | ICD-10-CM

## 2020-09-27 DIAGNOSIS — M6281 Muscle weakness (generalized): Secondary | ICD-10-CM

## 2020-09-27 DIAGNOSIS — R262 Difficulty in walking, not elsewhere classified: Secondary | ICD-10-CM | POA: Diagnosis present

## 2020-09-27 NOTE — Therapy (Signed)
[place], [place], [address] Phone: [phone]   Fax:  [phone]  Physical Therapy Treatment  Patient Details  Name: Merrillyn Ackerley MRN: [id number] Date of Birth: [birth year] Referring Provider (PT): Gayland Curry, DO   Encounter Date: 09/27/2020   PT End of Session - 09/27/20 1449    Visit Number 3    Number of Visits 9    Date for PT Re-Evaluation 11/26/20    Authorization Type AETNA MEDICARE HMO/PPO    Progress Note Due on Visit 10    PT Start Time 0250    PT Stop Time 0330    PT Time Calculation (min) 40 min    Activity Tolerance Patient tolerated treatment well    Behavior During Therapy [facility] for tasks assessed/performed           Past Medical History:  Diagnosis Date  . Arthritis   . Brain tumor ([place])   . High blood pressure   . Lung cancer ([place])   . Osteopenia   . Osteoporosis     Past Surgical History:  Procedure Laterality Date  . GALLBLADDER SURGERY    . TONSILLECTOMY      There were no vitals filed for this visit.   Subjective Assessment - 09/27/20 1452    Subjective Nothing different.    Reports lung removal last year and a couple of falls.   she feels she has regressed.  [place]  She  has incr to 20 reps now.  No falls since [place] months ago.                             [place] Adult PT Treatment/Exercise - 09/27/20 0001      Ambulation/Gait   Stairs Yes    Stairs Assistance 5: Supervision    Stair Management Technique Two rails;Alternating pattern   and no hands 4 inch step x 10   Number of Stairs 16    Height of Stairs 6      Neuro Re-ed    Neuro Re-ed Details  worked on walking speed and turns and narrow BOS balance  in tandem and stepping forward and back and to side with incr time in weight on stance leg. , side steps , marching  backward walking      Knee/Hip Exercises: Aerobic   Nustep L4 LE 5 min       Knee/Hip Exercises: Seated   Sit to Sand --   touch to seat.                    PT Short Term Goals - 09/17/20 1413      PT SHORT TERM GOAL #1   Title Pt will be Ind in an initial HEP    Baseline Started on eval    Status New    Target Date 10/08/20      PT SHORT TERM GOAL #2   Title Pt will voice understanding of fall prevention principles    Status New    Target Date 10/08/20             PT Long Term Goals - 09/17/20 1421      PT LONG TERM GOAL #1   Title Increased 2MWT distance to MCD of 354ft    Baseline 312ft    Status New    Target Date 11/26/20  PT LONG TERM GOAL #2   Title Improve 5xSTS to 9.5 sec or less    Baseline 10.9 sec    Status New    Target Date 11/26/20      PT LONG TERM GOAL #3   Title Pt will reports a little difficulty c asc. 2 flights of steps    Baseline Alot of difficulty    Status New    Target Date 11/26/20      PT LONG TERM GOAL #4   Title Pt's FOTO score will improve to predicted 57% functional ability    Baseline 55%    Status New    Target Date 11/26/20      PT LONG TERM GOAL #5   Title Improve bilat hip abd and ext strength to at least 4/5    Baseline 3/5    Status New    Target Date 11/26/20                 Plan - 09/27/20 1454    Clinical Impression Statement Worked on balance and walking /stairs.  She was SOB with most activity so stopped as needed. Cued her to let us know if legs are very sore post session .  ASked he rto bring in revious HEP as she said we gave her exercises she already has.    PT Treatment/Interventions Gait training;Stair training;Functional mobility training;Therapeutic activities;Therapeutic exercise;Balance training;Neuromuscular re-education;Patient/family education;Energy conservation;Taping;Manual techniques    PT Next Visit Plan NBOS & single leg balance control, stepping over obstacles, gait with head turns    PT Home Exercise Plan [id number]           Patient will  benefit from skilled therapeutic intervention in order to improve the following deficits and impairments:  Difficulty walking, Decreased endurance, Decreased strength, Decreased balance, Decreased activity tolerance  Visit Diagnosis: Difficulty in walking, not elsewhere classified  Muscle weakness (generalized)  Balance problems     Problem List Patient Active Problem List   Diagnosis Date Noted  . Adenocarcinoma of right lung, stage 1 ([place]) 09/21/2020  . History of lung cancer 08/30/2020  . Aortic atherosclerosis ([place]) 08/30/2020  . Recurrent falls 08/30/2020  . Senile osteoporosis 08/30/2020  . History of wrist fracture 08/30/2020  . Benign essential hypertension 08/30/2020  . History of meningioma 08/30/2020  . Primary osteoarthritis involving multiple joints 08/30/2020  . Primary open angle glaucoma (POAG) of both eyes, mild stage 08/30/2020    Darrel Hoover  PT 09/27/2020, 3:30 PM  [facility] [address] [place], [place], [address] Phone: [phone]   Fax:  [phone]  Name: Avryl Roehm MRN: [id number] Date of Birth: 1928-10-14

## 2020-10-11 ENCOUNTER — Other Ambulatory Visit: Payer: Self-pay

## 2020-10-11 ENCOUNTER — Ambulatory Visit: Payer: Medicare HMO

## 2020-10-11 DIAGNOSIS — R2689 Other abnormalities of gait and mobility: Secondary | ICD-10-CM

## 2020-10-11 DIAGNOSIS — R262 Difficulty in walking, not elsewhere classified: Secondary | ICD-10-CM | POA: Diagnosis not present

## 2020-10-11 DIAGNOSIS — M6281 Muscle weakness (generalized): Secondary | ICD-10-CM

## 2020-10-11 NOTE — Therapy (Signed)
[place]qu[place], [place], [address] Phone: [phone]   Fax:  [phone]  Physical Therapy Treatment  Patient Details  Name: Jacqueline Hunt MRN: [id number] Date of Birth: Feb 21, 1928 Referring Provider (PT): Gayland Curry, DO   Encounter Date: 10/11/2020   PT End of Session - 10/11/20 1448    Visit Number 4    Number of Visits 9    Date for PT Re-Evaluation 11/26/20    Authorization Type AETNA MEDICARE HMO/PPO    Progress Note Due on Visit 10    PT Start Time 0245    PT Stop Time 0300    PT Time Calculation (min) 15 min    Activity Tolerance Patient tolerated treatment well    Behavior During Therapy [facility] for tasks assessed/performed           Past Medical History:  Diagnosis Date  . Arthritis   . Brain tumor ([place])   . High blood pressure   . Lung cancer ([place])   . Osteopenia   . Osteoporosis     Past Surgical History:  Procedure Laterality Date  . GALLBLADDER SURGERY    . TONSILLECTOMY      There were no vitals filed for this visit.   Subjective Assessment - 10/11/20 1451    Subjective No new complaints                             [facility] Adult PT Treatment/Exercise - 10/11/20 0001      Neuro Re-ed    Neuro Re-ed Details  single and narrow BOS balancing .  with and           Reviewed her HEP from previous provider working to 30 eps in 1-3 sets.         PT Education - 10/11/20 1543    Education Details HEP respect for soreness and pain and to stop and modify reps /rfrequency to be able to  do 30 reps.   Practicing balance safely with challenge that she can be safe and successful but protected with surrounding possible support from walls and cahair. Terence Lux) Educated Patient    Methods Explanation    Comprehension Verbalized understanding            PT Short Term Goals - 10/11/20 1550      PT SHORT TERM GOAL #1   Title Pt will be Ind in an initial  HEP    Status Achieved      PT SHORT TERM GOAL #2   Title Pt will voice understanding of fall prevention principles    Status On-going             PT Long Term Goals - 09/17/20 1421      PT LONG TERM GOAL #1   Title Increased 2MWT distance to MCD of 352ft    Baseline 3101ft    Status New    Target Date 11/26/20      PT LONG TERM GOAL #2   Title Improve 5xSTS to 9.5 sec or less    Baseline 10.9 sec    Status New    Target Date 11/26/20      PT LONG TERM GOAL #3   Title Pt will reports a little difficulty c asc. 2 flights of steps    Baseline Alot of difficulty    Status New    Target Date 11/26/20  PT LONG TERM GOAL #4   Title Pt's FOTO score will improve to predicted 57% functional ability    Baseline 55%    Status New    Target Date 11/26/20      PT LONG TERM GOAL #5   Title Improve bilat hip abd and ext strength to at least 4/5    Baseline 3/5    Status New    Target Date 11/26/20                 Plan - 10/11/20 1545    Clinical Impression Statement We worked on [employer] and balance working on finding a safe challenge for balance. also discussed due to age and other factors  she may not have good narrow BOS and single leg balance compared to norms    PT Treatment/Interventions Gait training;Stair training;Functional mobility training;Therapeutic activities;Therapeutic exercise;Balance training;Neuromuscular re-education;Patient/family education;Energy conservation;Taping;Manual techniques    PT Next Visit Plan NBOS & single leg balance control, stepping over obstacles, gait with head turns  bands with standing HEP    PT Home Exercise Plan [id number]    Consulted and Agree with Plan of Care Patient           Patient will benefit from skilled therapeutic intervention in order to improve the following deficits and impairments:  Difficulty walking, Decreased endurance, Decreased strength, Decreased balance, Decreased activity tolerance  Visit  Diagnosis: Muscle weakness (generalized)  Balance problems     Problem List Patient Active Problem List   Diagnosis Date Noted  . Adenocarcinoma of right lung, stage 1 ([place]) 09/21/2020  . History of lung cancer 08/30/2020  . Aortic atherosclerosis ([place]) 08/30/2020  . Recurrent falls 08/30/2020  . Senile osteoporosis 08/30/2020  . History of wrist fracture 08/30/2020  . Benign essential hypertension 08/30/2020  . History of meningioma 08/30/2020  . Primary osteoarthritis involving multiple joints 08/30/2020  . Primary open angle glaucoma (POAG) of both eyes, mild stage 08/30/2020    Darrel Hoover    PT 10/11/2020, 3:53 PM  [facility] [address] [place], [place], [address] Phone: [phone]   Fax:  [phone]  Name: Jacqueline Hunt MRN: [id number] Date of Birth: Jun 24, 1928

## 2020-10-13 ENCOUNTER — Ambulatory Visit (HOSPITAL_COMMUNITY)
Admission: RE | Admit: 2020-10-13 | Discharge: 2020-10-13 | Disposition: A | Discharge status: Home / Self Care | Payer: Medicare HMO | Source: Ambulatory Visit | Attending: Internal Medicine | Admitting: Internal Medicine

## 2020-10-13 ENCOUNTER — Other Ambulatory Visit: Payer: Self-pay

## 2020-10-13 ENCOUNTER — Encounter (HOSPITAL_COMMUNITY): Payer: Self-pay

## 2020-10-13 DIAGNOSIS — C3490 Malignant neoplasm of unspecified part of unspecified bronchus or lung: Secondary | ICD-10-CM

## 2020-10-13 MED ORDER — IOHEXOL 300 MG/ML  SOLN
75.0000 mL | Freq: Once | INTRAMUSCULAR | Status: AC | PRN
  Administered 2020-10-13: 75 mL via INTRAVENOUS

## 2020-10-14 ENCOUNTER — Encounter: Payer: Self-pay | Admitting: Internal Medicine

## 2020-10-14 NOTE — Telephone Encounter (Signed)
Message routed to [employer], DO . Please Advise.

## 2020-10-16 ENCOUNTER — Encounter: Payer: Self-pay | Admitting: Internal Medicine

## 2020-10-18 ENCOUNTER — Ambulatory Visit: Payer: Medicare HMO

## 2020-10-18 ENCOUNTER — Other Ambulatory Visit: Payer: Self-pay

## 2020-10-18 DIAGNOSIS — R262 Difficulty in walking, not elsewhere classified: Secondary | ICD-10-CM | POA: Diagnosis not present

## 2020-10-18 DIAGNOSIS — M6281 Muscle weakness (generalized): Secondary | ICD-10-CM

## 2020-10-18 DIAGNOSIS — R2689 Other abnormalities of gait and mobility: Secondary | ICD-10-CM

## 2020-10-18 NOTE — Therapy (Signed)
[place], [place], [address] Phone: [phone]   Fax:  [phone]  Physical Therapy Treatment  Patient Details  Name: Jacqueline Hunt MRN: [id number] Date of Birth: 1928-11-19 Referring Provider (PT): Gayland Curry, DO   Encounter Date: 10/18/2020   PT End of Session - 10/18/20 1431    Visit Number 5    Number of Visits 9    Date for PT Re-Evaluation 11/26/20    Authorization Type AETNA MEDICARE HMO/PPO    Progress Note Due on Visit 10    PT Start Time 0230    PT Stop Time 0325    PT Time Calculation (min) 55 min    Activity Tolerance Patient tolerated treatment well    Behavior During Therapy [facility] for tasks assessed/performed           Past Medical History:  Diagnosis Date   Arthritis    Brain tumor ([place])    High blood pressure    Lung cancer ([place])    Osteopenia    Osteoporosis     Past Surgical History:  Procedure Laterality Date   GALLBLADDER SURGERY     TONSILLECTOMY      There were no vitals filed for this visit.       [facility] PT Assessment - 10/18/20 0001      Ambulation/Gait   Ambulation/Gait Yes    Ambulation/Gait Assistance 5: Supervision    Ambulation Distance (Feet) 450 Feet    Assistive device None    Gait Pattern Step-through pattern    Stairs Yes    Stairs Assistance 6: Modified independent (Device/Increase time)    Stair Management Technique Two rails;Alternating pattern    Number of Stairs 16    Height of Stairs 6      Standardized Balance Assessment   Standardized Balance Assessment Five Times Sit to Stand    Five times sit to stand comments  12 sec no UE assist      Berg Balance Test   Sit to Stand Able to stand without using hands and stabilize independently    Standing Unsupported Able to stand safely 2 minutes    Sitting with Back Unsupported but Feet Supported on Floor or Stool Able to sit safely and securely 2 minutes    Stand to Sit Sits  safely with minimal use of hands    Transfers Able to transfer safely, minor use of hands    Standing Unsupported with Eyes Closed Able to stand 10 seconds safely    Standing Unsupported with Feet Together Able to place feet together independently and stand 1 minute safely    From Standing, Reach Forward with Outstretched Arm Can reach forward >12 cm safely (5")    From Standing Position, Pick up Object from Floor Able to pick up shoe safely and easily    From Standing Position, Turn to Look Behind Over each Shoulder Looks behind one side only/other side shows less weight shift    Turn 360 Degrees Able to turn 360 degrees safely but slowly    Standing Unsupported, Alternately Place Feet on Step/Stool Able to stand independently and complete 8 steps >20 seconds    Standing Unsupported, One Foot in Front Able to take small step independently and hold 30 seconds    Standing on One Leg Tries to lift leg/unable to hold 3 seconds but remains standing independently    Total Score 46  [facility] Adult PT Treatment/Exercise - 10/18/20 0001      Neuro Re-ed    Neuro Re-ed Details  single and narrow BOS balancing .  with and       Knee/Hip Exercises: Aerobic   Nustep L4 LE 5 min      Knee/Hip Exercises: Standing   Other Standing Knee Exercises holding one hand  yellow band x 10-15 reps RT and Lt legs 4 way hip SLR ( revieweed form previous HEP and asked to resume as able , yellow band issued)      Knee/Hip Exercises: Seated   Sit to Sand 5 reps                    PT Short Term Goals - 10/11/20 1550      PT SHORT TERM GOAL #1   Title Pt will be Ind in an initial HEP    Status Achieved      PT SHORT TERM GOAL #2   Title Pt will voice understanding of fall prevention principles    Status On-going             PT Long Term Goals - 10/18/20 1501      PT LONG TERM GOAL #1   Title Increased 2MWT distance to MCD of 310f    Baseline 450     Status Achieved      PT LONG TERM GOAL #2   Title Improve 5xSTS to 9.5 sec or less    Baseline 12 sec    Status On-going      PT LONG TERM GOAL #3   Title Pt will reports a little difficulty c asc. 2 flights of steps    Baseline easy with railing x 2    Status Partially Met      PT LONG TERM GOAL #4   Title Pt's FOTO score will improve to predicted 57% functional ability    Baseline 56%    Status On-going      PT LONG TERM GOAL #5   Title Improve bilat hip abd and ext strength to at least 4/5    Baseline All 4/5 or better    Status Achieved                 Plan - 10/18/20 1508    Clinical Impression Statement REassessed to day for goals  and she has met  LTG   . for strength /walking distance and mad a slith incr in FOTO and walking comfort on stairs . BERG score was significantly improved but she still needs the cane for decreasing risk of fall. She did not come today with a device.   She rates diffcuty walking stairs . taking photos in public as she is not steady. She related that in 2006 she was not able to single leg stand or tandem stand. now 15 years later it is not likely she will be able to do this. we will continue to wor o balance  to end of POC and decide on continue/discharg or transfer to Neuro balance PT's    PT Treatment/Interventions Gait training;Stair training;Functional mobility training;Therapeutic activities;Therapeutic exercise;Balance training;Neuromuscular re-education;Patient/family education;Energy conservation;Taping;Manual techniques    PT Next Visit Plan NBOS & single leg balance control, stepping over obstacles, gait with head turns  bands with standing HEP    PT Home Exercise Plan [id number]   Consulted and Agree with Plan of Care Patient           Patient  will benefit from skilled therapeutic intervention in order to improve the following deficits and impairments:  Difficulty walking, Decreased endurance, Decreased strength, Decreased balance,  Decreased activity tolerance  Visit Diagnosis: Muscle weakness (generalized)  Balance problems  Difficulty in walking, not elsewhere classified     Problem List Patient Active Problem List   Diagnosis Date Noted   Adenocarcinoma of right lung, stage 1 ([place]) 09/21/2020   History of lung cancer 08/30/2020   Aortic atherosclerosis ([place]) 08/30/2020   Recurrent falls 08/30/2020   Senile osteoporosis 08/30/2020   History of wrist fracture 08/30/2020   Benign essential hypertension 08/30/2020   History of meningioma 08/30/2020   Primary osteoarthritis involving multiple joints 08/30/2020   Primary open angle glaucoma (POAG) of both eyes, mild stage 08/30/2020    Darrel Hoover   PT 10/18/2020, 5:04 PM  [facility] [address] [place], [place], [address] Phone: [phone]   Fax:  [phone]  Name: Jacqueline Hunt MRN: [id number] Date of Birth: 02/03/1928

## 2020-10-19 ENCOUNTER — Telehealth: Payer: Self-pay | Admitting: Internal Medicine

## 2020-10-19 NOTE — Telephone Encounter (Signed)
Scheduled appt per 11/22 sch msg  - left message for patient with appt date and time

## 2020-10-20 ENCOUNTER — Inpatient Hospital Stay: Payer: Medicare HMO | Attending: Internal Medicine | Admitting: Internal Medicine

## 2020-10-20 ENCOUNTER — Encounter: Payer: Self-pay | Admitting: Internal Medicine

## 2020-10-20 DIAGNOSIS — C3490 Malignant neoplasm of unspecified part of unspecified bronchus or lung: Secondary | ICD-10-CM

## 2020-10-20 DIAGNOSIS — C3491 Malignant neoplasm of unspecified part of right bronchus or lung: Secondary | ICD-10-CM

## 2020-10-20 NOTE — Progress Notes (Signed)
[place] [phone]   [phone]  PROGRESS NOTE FOR TELEMEDICINE VISITS  Hunt, Jacqueline L, DO [address] [place] [address]  I connected with@ on 10/20/20 at 10:15 AM EST by telephone visit and verified that I am speaking with the correct person using two identifiers.   I discussed the limitations, risks, security and privacy concerns of performing an evaluation and management service by telemedicine and the availability of in-person appointments. I also discussed with the patient that there may be a patient responsible charge related to this service. The patient expressed understanding and agreed to proceed.  Other persons participating in the visit and their role in the encounter: None  Patient's location: Home Provider's location: [facility].  DIAGNOSIS: stage Ib (T2 a, N0, M0) non-small cell lung cancer, adenocarcinoma predominantly acinar with lipidic features diagnosed in August 2020. Molecular studies showed negative for EGFR mutation and negative PD-L1 expression.  PRIOR THERAPY:  Status post right upper lobectomy with lymph node dissection in [place]S  CURRENT THERAPY: Observation.  INTERVAL HISTORY: Jacqueline Hunt 86 y.o. female has a telephone virtual visit with me today for evaluation and discussion of her scan results.  The patient is feeling fine today with no concerning complaints except for imbalance of her gait.  She is currently undergoing physical therapy.  She denied having any chest pain, shortness of breath, cough or hemoptysis.  She denied having any nausea, vomiting, diarrhea or constipation.  She has no headache or visual changes.  She had repeat CT scan of the chest performed recently and she is here for evaluation and discussion of her scan results.  MEDICAL HISTORY: Past Medical History:  Diagnosis Date  . Arthritis   . Brain tumor ([place])   . High blood pressure   . Lung cancer ([place])    . Osteopenia   . Osteoporosis     ALLERGIES:  has No Known Allergies.  MEDICATIONS:  Current Outpatient Medications  Medication Sig Dispense Refill  . aspirin EC 81 MG tablet Take 81 mg by mouth daily.    . Biotin 10000 MCG TABS Take 1 tablet by mouth daily.    . Cholecalciferol (VITAMIN D3 PO) Take 500 mg by mouth daily.    Marland Kitchen estradiol (CLIMARA - DOSED IN MG/24 HR) 0.05 mg/24hr patch Place 1 patch (0.05 mg total) onto the skin once a week. 4 patch 4  . latanoprost (XALATAN) 0.005 % ophthalmic solution Place 1 drop into both eyes at bedtime.    Marland Kitchen lisinopril (ZESTRIL) 10 MG tablet Take 10 mg by mouth 2 (two) times daily.    . Multiple Vitamins-Minerals (DAILY MULTIVITAMIN PO) Take 1 tablet by mouth daily.    . rosuvastatin (CRESTOR) 5 MG tablet Take 5 mg by mouth every evening.     No current facility-administered medications for this visit.    SURGICAL HISTORY:  Past Surgical History:  Procedure Laterality Date  . GALLBLADDER SURGERY    . TONSILLECTOMY      REVIEW OF SYSTEMS:  A comprehensive review of systems was negative except for: Constitutional: positive for fatigue Neurological: positive for gait problems    LABORATORY DATA: Lab Results  Component Value Date   WBC 4.2 09/21/2020   HGB 12.7 09/21/2020   HCT 39.4 09/21/2020   MCV 92.5 09/21/2020   PLT 150 09/21/2020      Chemistry      Component Value Date/Time   NA 140 09/21/2020 1349  K 3.9 09/21/2020 1349   CL 104 09/21/2020 1349   CO2 28 09/21/2020 1349   BUN 19 09/21/2020 1349   CREATININE 0.76 09/21/2020 1349   CREATININE 0.67 08/30/2020 1512      Component Value Date/Time   CALCIUM 9.5 09/21/2020 1349   ALKPHOS 59 09/21/2020 1349   AST 18 09/21/2020 1349   ALT 15 09/21/2020 1349   BILITOT 0.5 09/21/2020 1349       RADIOGRAPHIC STUDIES: CT Chest W Contrast  Result Date: 10/14/2020 CLINICAL DATA:  Lung cancer. EXAM: CT CHEST WITH CONTRAST TECHNIQUE: Multidetector CT imaging of the chest  was performed during intravenous contrast administration. CONTRAST:  75mL OMNIPAQUE IOHEXOL 300 MG/ML  SOLN COMPARISON:  None. FINDINGS: Cardiovascular: Atherosclerotic calcification of the aorta, aortic valve and coronary arteries. Ascending aorta measures 4.2 cm. Heart is enlarged. No pericardial effusion. Mediastinum/Nodes: Subcentimeter low-attenuation lesions in the thyroid. No follow-up recommended (ref: J Am Coll Radiol. 2015 Feb;12(2): 143-50).No pathologically enlarged mediastinal, hilar or axillary lymph nodes. Esophagus is grossly unremarkable. Lungs/Pleura: Biapical pleuroparenchymal scarring. Postoperative changes of right upper lobectomy. A few scattered pulmonary nodules measure up to 4 mm in the posterior left upper lobe (5/37). No pleural fluid. Tracheal diverticula. Airway is otherwise unremarkable. Upper Abdomen: Visualized portions of the liver and right adrenal gland are unremarkable. Thickening of the left adrenal gland. Visualized portions of the kidneys, spleen, pancreas, stomach and bowel are unremarkable. No upper abdominal adenopathy. Musculoskeletal: Degenerative changes in the spine. No worrisome lytic or sclerotic lesions. IMPRESSION: 1. Right upper lobectomy. Scattered pulmonary nodules measure 4 mm or less in size. Continued attention on follow-up exams is recommended. 2. Ascending Aortic aneurysm NOS (ICD10-I71.9). Recommend annual imaging followup by CTA or MRA. This recommendation follows 2010 ACCF/AHA/AATS/ACR/ASA/SCA/SCAI/SIR/STS/SVM Guidelines for the Diagnosis and Management of Patients with Thoracic Aortic Disease. Circulation. 2010; 121: I347-Q259. Aortic aneurysm NOS (ICD10-I71.9). 3. Aortic atherosclerosis (ICD10-I70.0). Coronary artery calcification. Electronically Signed   By: Lorin Picket M.D.   On: 10/14/2020 08:49    ASSESSMENT AND PLAN: This is a very pleasant 85 years old white female diagnosed with stage Ib (T2 a, N0, M0) non-small cell lung cancer,  adenocarcinoma predominantly acinar with lipidic features diagnosed in August 2020 status post right upper lobectomy with lymph node dissection in [place]). Molecular studies showed negative for EGFR mutation and negative PD-L1 expression. The patient is currently on observation and she is feeling fine with no concerning complaints. She had repeat CT scan of the chest performed recently.  I personally and independently reviewed the scans and discussed the results with the patient today. Her scan showed scattered pulmonary nodules measuring 4 mm or less that need close attention on follow-up imaging studies. The patient also has few question about the coronary artery disease and aortic aneurysm and she was advised to reach out to her primary care physician for evaluation and referral to cardiothoracic surgery or cardiology if needed. I will arrange for her to have repeat CT scan of the chest in 6 months. She was advised to call immediately if she has any other concerning symptoms in the interval. I discussed the assessment and treatment plan with the patient. The patient was provided an opportunity to ask questions and all were answered. The patient agreed with the plan and demonstrated an understanding of the instructions.   The patient was advised to call back or seek an in-person evaluation if the symptoms worsen or if the condition fails to improve as anticipated.  I  provided 12 minutes of non face-to-face telephone visit time during this encounter, and > 50% was spent counseling as documented under my assessment & plan.  Eilleen Kempf, MD 10/20/2020 9:50 AM  Disclaimer: This note was dictated with voice recognition software. Similar sounding words can inadvertently be transcribed and may not be corrected upon review.

## 2020-10-25 ENCOUNTER — Telehealth: Payer: Self-pay | Admitting: Internal Medicine

## 2020-10-25 NOTE — Telephone Encounter (Signed)
Scheduled appt per los. Called and spoke with patient. Cancelled April appt. Confirmed appts with patient

## 2020-10-28 ENCOUNTER — Ambulatory Visit: Payer: Medicare HMO

## 2020-10-28 ENCOUNTER — Other Ambulatory Visit: Payer: Medicare HMO

## 2020-10-28 DIAGNOSIS — Z20822 Contact with and (suspected) exposure to covid-19: Secondary | ICD-10-CM

## 2020-10-29 LAB — SARS-COV-2, NAA 2 DAY TAT

## 2020-10-29 LAB — NOVEL CORONAVIRUS, NAA: SARS-CoV-2, NAA: NOT DETECTED

## 2020-11-01 ENCOUNTER — Ambulatory Visit: Payer: Medicare HMO | Admitting: Podiatry

## 2020-11-01 ENCOUNTER — Other Ambulatory Visit: Payer: Self-pay

## 2020-11-01 ENCOUNTER — Encounter: Payer: Self-pay | Admitting: Podiatry

## 2020-11-01 DIAGNOSIS — M2042 Other hammer toe(s) (acquired), left foot: Secondary | ICD-10-CM

## 2020-11-01 DIAGNOSIS — M2041 Other hammer toe(s) (acquired), right foot: Secondary | ICD-10-CM

## 2020-11-01 DIAGNOSIS — L84 Corns and callosities: Secondary | ICD-10-CM

## 2020-11-01 NOTE — Patient Instructions (Signed)
More silicone pads can be purchased from:  [email or web address]

## 2020-11-03 NOTE — Progress Notes (Signed)
  Subjective:  Patient ID: Jacqueline Hunt, female    DOB: [birth year],  MRN: [id number]  Chief Complaint  Patient presents with  . Callouses    Patient presents today for painful corns bilat 5th toes off and on x years.  She says they burn and sting and are painful when she walks    86 y.o. female presents with the above complaint. History confirmed with patient.  She has been trying silicone toe caps which been helpful  Objective:  Physical Exam: warm, good capillary refill, no trophic changes or ulcerative lesions, normal DP and PT pulses and normal sensory exam. Left Foot: Heloma molle present left fourth interspace lateral fourth toe and medial fifth, hammertoe contractures 2 through 5 Right Foot: Hammertoe contractures 2 through 5, corn on the medial border of nail fold    Assessment:  No diagnosis found.   Plan:  Patient was evaluated and treated and all questions answered.  Discussed with her the etiology and treatment options for these skin lesions and other related to the hammertoe contractures.  Recommend debridement today.  I did anesthetize her with 2 cc each toe of 2% Xylocaine plain.  Following anesthesia I debrided the lesion sharply with a curette and chisel blade.  I recommend she continue to use the silicone pads and I gave her a few more these today.  Discussed with her that if not improving we could consider surgical options but she does not want to pursue that at this time and I understand that.  Return if symptoms worsen or fail to improve.

## 2020-11-11 ENCOUNTER — Ambulatory Visit: Payer: Medicare HMO | Attending: Internal Medicine

## 2020-11-11 ENCOUNTER — Other Ambulatory Visit: Payer: Self-pay

## 2020-11-11 DIAGNOSIS — M6281 Muscle weakness (generalized): Secondary | ICD-10-CM

## 2020-11-11 DIAGNOSIS — R2689 Other abnormalities of gait and mobility: Secondary | ICD-10-CM | POA: Diagnosis present

## 2020-11-11 NOTE — Therapy (Addendum)
[place], [place], [address] Phone: [phone]   Fax:  [phone]  Physical Therapy Treatment / Discharge  Patient Details  Name: Jacqueline Hunt MRN: [id number] Date of Birth: 1928/04/10 Referring Provider (PT): Gayland Curry, DO   Encounter Date: 11/11/2020   PT End of Session - 11/11/20 1426    Visit Number 6    Number of Visits 9    Date for PT Re-Evaluation 11/26/20    Authorization Type AETNA MEDICARE HMO/PPO    Progress Note Due on Visit 10    PT Start Time 0240    PT Stop Time 0320    PT Time Calculation (min) 40 min    Activity Tolerance Patient tolerated treatment well    Behavior During Therapy [facility] for tasks assessed/performed           Past Medical History:  Diagnosis Date  . Arthritis   . Brain tumor ([place])   . High blood pressure   . Lung cancer ([place])   . Osteopenia   . Osteoporosis     Past Surgical History:  Procedure Laterality Date  . GALLBLADDER SURGERY    . TONSILLECTOMY      There were no vitals filed for this visit.   Subjective Assessment - 11/11/20 1443    Subjective Exposed to covid  but tested negative.   Daughter gifted a foam pad for home balance activity    Currently in Pain? No/denies              [facility] PT Assessment - 11/11/20 0001      Assessment   Medical Diagnosis Recurrent falls; Unsteady gait when walking    Referring Provider (PT) Gayland Curry, DO    Onset Date/Surgical Date 06/30/19      Precautions   Precautions Fall      Restrictions   Weight Bearing Restrictions No      Prior Function   Level of Independence Independent      Cognition   Overall Cognitive Status Within Functional Limits for tasks assessed      Strength   Overall Strength Comments Some of the 4+/5 may have been closer to  5-/5    Right Hip Flexion 4+/5    Right Hip Extension 4/5    Right Hip External Rotation  4+/5    Right Hip Internal Rotation 4+/5     Right Hip ABduction 4+/5    Left Hip Flexion 4+/5    Left Hip Extension 4/5    Left Hip External Rotation 4+/5    Left Hip Internal Rotation 4/5    Left Hip ABduction 4+/5    Right Knee Flexion 5/5    Right Knee Extension 5/5    Left Knee Flexion 4+/5    Left Knee Extension 5/5    Right/Left Ankle Left;Right    Right Ankle Dorsiflexion 5/5    Left Ankle Dorsiflexion 5/5      Ambulation/Gait   Stairs Assistance --    Stair Management Technique --    Number of Stairs --    Height of Stairs --      Standardized Balance Assessment   Five times sit to stand comments  12 sec no UE assist      Berg Balance Test   Sit to Stand Able to stand without using hands and stabilize independently    Standing Unsupported Able to stand safely 2 minutes    Sitting with Back Unsupported but Feet  Supported on Floor or Stool Able to sit safely and securely 2 minutes    Stand to Sit Sits safely with minimal use of hands    Transfers Able to transfer safely, minor use of hands    Standing Unsupported with Eyes Closed Able to stand 10 seconds safely    Standing Unsupported with Feet Together Able to place feet together independently and stand 1 minute safely    From Standing, Reach Forward with Outstretched Arm Can reach confidently >25 cm (10")    From Standing Position, Pick up Object from Floor Able to pick up shoe safely and easily    From Standing Position, Turn to Look Behind Over each Shoulder Looks behind from both sides and weight shifts well    Turn 360 Degrees Able to turn 360 degrees safely in 4 seconds or less    Standing Unsupported, Alternately Place Feet on Step/Stool Able to stand independently and safely and complete 8 steps in 20 seconds    Standing Unsupported, One Foot in [place] to take small step independently and hold 30 seconds    Standing on One Leg Tries to lift leg/unable to hold 3 seconds but remains standing independently    Total Score 51                          [facility] Adult PT Treatment/Exercise - 11/11/20 0001      Neuro Re-ed    Neuro Re-ed Details  single and narrow BOS balancing .  with and       Knee/Hip Exercises: Aerobic   Nustep L4 LE 5 min                    PT Short Term Goals - 10/11/20 1550      PT SHORT TERM GOAL #1   Title Pt will be Ind in an initial HEP    Status Achieved      PT SHORT TERM GOAL #2   Title Pt will voice understanding of fall prevention principles    Status On-going             PT Long Term Goals - 10/18/20 1501      PT LONG TERM GOAL #1   Title Increased 2MWT distance to MCD of 328ft    Baseline 450    Status Achieved      PT LONG TERM GOAL #2   Title Improve 5xSTS to 9.5 sec or less    Baseline 12 sec    Status On-going      PT LONG TERM GOAL #3   Title Pt will reports a little difficulty c asc. 2 flights of steps    Baseline easy with railing x 2    Status Partially Met      PT LONG TERM GOAL #4   Title Pt's FOTO score will improve to predicted 57% functional ability    Baseline 56%    Status On-going      PT LONG TERM GOAL #5   Title Improve bilat hip abd and ext strength to at least 4/5    Baseline All 4/5 or better    Status Achieved                 Plan - 11/11/20 1444    Clinical Impression Statement She scored increases in strength and  BERG score . Narrow BOS and single leg balance still the mauin issue and she reported  this has been the case  for many years. She has had 1-2 episodes of PT over the last 15 year s for balance.   At this point it may be more beneficia for Ms Fleener to move to Neuro rehab for balance  but will let L[place]ne Co. decide that on the next visit    Personal Factors and Comorbidities Age;Comorbidity 3+    Comorbidities brain tumor, lung CA c resection, arthritis    Examination-Activity Limitations Locomotion Level;Stairs;Stand    PT Treatment/Interventions Gait training;Stair training;Functional mobility  training;Therapeutic activities;Therapeutic exercise;Balance training;Neuromuscular re-education;Patient/family education;Energy conservation;Taping;Manual techniques    PT Next Visit Plan NBOS & single leg balance control, stepping over obstacles, gait with head turns  bands with standing HEP   decide next visit if we wil cointnue her with balance and strength She  or transfer to Neuro Rehab for further  balance therapy    PT Home Exercise Plan [id number]  foam pad exercise . (march , SLS, step on /off forward and back and side to side,  mini squats, ankle DF/PF all with UE support)    Consulted and Agree with Plan of Care Patient           Patient will benefit from skilled therapeutic intervention in order to improve the following deficits and impairments:  Difficulty walking,Decreased endurance,Decreased strength,Decreased balance,Decreased activity tolerance  Visit Diagnosis: Muscle weakness (generalized)  Balance problems     Problem List Patient Active Problem List   Diagnosis Date Noted  . Adenocarcinoma of right lung, stage 1 ([place]) 09/21/2020  . History of lung cancer 08/30/2020  . Aortic atherosclerosis ([place]) 08/30/2020  . Recurrent falls 08/30/2020  . Senile osteoporosis 08/30/2020  . History of wrist fracture 08/30/2020  . Benign essential hypertension 08/30/2020  . History of meningioma 08/30/2020  . Primary osteoarthritis involving multiple joints 08/30/2020  . Primary open angle glaucoma (POAG) of both eyes, mild stage 08/30/2020  . Drug-induced constipation 07/14/2019  . Hyperlipidemia 06/25/2019  . Multiple thyroid nodules 06/25/2019  . Lung nodule 06/10/2019  . ASNHL (asymmetrical sensorineural hearing loss) 07/26/2018  . Osteoarthritis 11/29/2017  . Hot flashes due to surgical menopause 04/12/2017  . Skin macule 03/16/2017  . Benign nevus 09/22/2016  . Seborrheic keratoses 09/22/2016  . Skin cancer screening 09/22/2016  . Primary hyperparathyroidism ([place])  07/18/2016  . Rheumatoid pannus of cervical spine ([place]) 11/10/2015  . History of total hysterectomy with bilateral salpingo-oophorectomy (BSO) 11/26/2014  . Mild cognitive disorder 11/26/2014  . Osteopenia 11/26/2014  . Restless leg 11/26/2014  . Insomnia 12/24/2012  . Obstructive sleep apnea (adult) (pediatric) 12/24/2012    Darrel Hoover   PT 11/11/2020, 3:46 PM  [facility] [address] [place], [place], [address] Phone: [phone]   Fax:  [phone]  Name: Jacqueline Hunt MRN: [id number] Date of Birth: August 27, 1928        PHYSICAL THERAPY DISCHARGE SUMMARY  Visits from Start of Care: 6  Current functional level related to goals / functional outcomes: See goals    Remaining deficits: Current status unknown   Education / Equipment: HEP  Plan: Patient agrees to discharge.  Patient goals were not met. Patient is being discharged due to not returning since the last visit.  ?????         Kristoffer Leamon PT, DPT, LAT, ATC  01/20/21  9:39 AM

## 2020-11-25 ENCOUNTER — Ambulatory Visit: Payer: Medicare HMO | Admitting: Physical Therapy

## 2020-12-02 ENCOUNTER — Encounter: Payer: Self-pay | Admitting: Internal Medicine

## 2020-12-02 ENCOUNTER — Other Ambulatory Visit: Payer: Self-pay

## 2020-12-02 ENCOUNTER — Ambulatory Visit (INDEPENDENT_AMBULATORY_CARE_PROVIDER_SITE_OTHER): Payer: Medicare HMO | Admitting: Internal Medicine

## 2020-12-02 VITALS — BP 122/62 | HR 89 | Temp 97.9°F | Ht 61.0 in | Wt 109.4 lb

## 2020-12-02 DIAGNOSIS — Z86018 Personal history of other benign neoplasm: Secondary | ICD-10-CM

## 2020-12-02 DIAGNOSIS — M810 Age-related osteoporosis without current pathological fracture: Secondary | ICD-10-CM

## 2020-12-02 DIAGNOSIS — I10 Essential (primary) hypertension: Secondary | ICD-10-CM

## 2020-12-02 DIAGNOSIS — I700 Atherosclerosis of aorta: Secondary | ICD-10-CM | POA: Diagnosis not present

## 2020-12-02 DIAGNOSIS — R296 Repeated falls: Secondary | ICD-10-CM

## 2020-12-02 DIAGNOSIS — Z85118 Personal history of other malignant neoplasm of bronchus and lung: Secondary | ICD-10-CM

## 2020-12-02 NOTE — Patient Instructions (Signed)
[facility]:  [phone]

## 2020-12-02 NOTE — Progress Notes (Signed)
Location:  [facility] clinic Provider:  Dania Marsan L. Mariea Clonts, D.O., C.M.D.  Goals of Care:  Advanced Directives 12/02/2020  Does Patient Have a Medical Advance Directive? Yes  Type of Paramedic of [place];Out of facility DNR (pink MOST or yellow form)  Does patient want to make changes to medical advance directive? No - Patient declined  Copy of [place] in Chart? Yes - validated most recent copy scanned in chart (See row information)     Chief Complaint  Patient presents with  . Medical Management of Chronic Issues    3 month follow up   . Health Maintenance    Dexa scan     HPI: Patient is a 87 y.o. Hunt seen today for medical management of chronic diseases.    Doing well.  It's been very easy to adjust.  Needs bone density:  She wanted to get this scheduled.    She saw Dr. Julien Nordmann for her lung cancer:  She has a f/u CT scan of her lungs for this.  She was told the initial showed something, but not requiring immediate intervention.  Has something on her abdomen for 4-5 months.  She forgot to mention it last time.  Wants me to check it.  No pain.    Has been to podiatry.  She had some surgery in the office.  Her feet hurt for a couple of weeks afterward.  Unfortunately, the callus will come back.  Wears toe guards for her hammertoes--they rub and build a callus that hurts.    Went to PT 12/16.  It's now in limbo b/c both of the people who saw her are not there.  One retired and one on maternity leave.  She'd met the goals for everything except what she could not do at all (stand on one foot or walk one foot in front of the other--that's not new though for her).  She has not called them back.  She has not fallen in over a year since recovering from surgery.  She is still not where she was before surgery.  She is 89.    She's gone off biotin and on B12.    Past Medical History:  Diagnosis Date  . Arthritis   . Brain tumor ([place])   . High  blood pressure   . Lung cancer ([place])   . Osteopenia   . Osteoporosis     Past Surgical History:  Procedure Laterality Date  . GALLBLADDER SURGERY    . TONSILLECTOMY      No Known Allergies  Outpatient Encounter Medications as of 12/02/2020  Medication Sig  . aspirin EC 81 MG tablet Take 81 mg by mouth daily.  . Cholecalciferol (VITAMIN D3 PO) Take 500 mg by mouth daily.  Marland Kitchen estradiol (CLIMARA - DOSED IN MG/24 HR) 0.05 mg/24hr patch Place 1 patch (0.05 mg total) onto the skin once a week.  . latanoprost (XALATAN) 0.005 % ophthalmic solution Place 1 drop into both eyes at bedtime.  Marland Kitchen lisinopril (ZESTRIL) 10 MG tablet Take 10 mg by mouth 2 (two) times daily.  . Multiple Vitamins-Minerals (DAILY MULTIVITAMIN PO) Take 1 tablet by mouth daily.  . rosuvastatin (CRESTOR) 5 MG tablet Take 5 mg by mouth every evening.  . vitamin B-12 (CYANOCOBALAMIN) 500 MCG tablet Take 500 mcg by mouth daily.  . [DISCONTINUED] Biotin 10000 MCG TABS Take 1 tablet by mouth daily.   No facility-administered encounter medications on file as of 12/02/2020.  Review of Systems:  ROS  Health Maintenance  Topic Date Due  . DEXA SCAN  Never done  . COVID-19 Vaccine (4 - Booster for Pfizer series) 02/14/2021  . TETANUS/TDAP  11/30/2027  . INFLUENZA VACCINE  Completed  . PNA vac Low Risk Adult  Completed    Physical Exam: Vitals:   12/02/20 1004  BP: 122/62  Pulse: 89  Temp: 97.9 F (36.6 C)  TempSrc: Temporal  SpO2: 96%  Weight: 109 lb 6.4 oz (49.6 kg)  Height: $Remove'5\' 1"'SIwOFgh$  (1.549 m)   Body mass index is 20.67 kg/m. Physical Exam Constitutional:      Appearance: Normal appearance.  Eyes:     Extraocular Movements: Extraocular movements intact.     Pupils: Pupils are equal, round, and reactive to light.  Cardiovascular:     Rate and Rhythm: Normal rate and regular rhythm.  Pulmonary:     Effort: Pulmonary effort is normal.     Breath sounds: Normal breath sounds.  Musculoskeletal:        General:  Normal range of motion.     Right lower leg: No edema.     Left lower leg: No edema.  Neurological:     General: No focal deficit present.     Mental Status: She is alert and oriented to person, place, and time.     Comments: Balance not great getting up and down from exam table  Psychiatric:        Mood and Affect: Mood normal.        Behavior: Behavior normal.     Labs reviewed: Basic Metabolic Panel: Recent Labs    08/30/20 1512 09/21/20 1349  NA 140 140  K 4.0 3.9  CL 102 104  CO2 29 28  GLUCOSE 80 98  BUN 18 19  CREATININE 0.67 0.76  CALCIUM 9.8 9.5   Liver Function Tests: Recent Labs    08/30/20 1512 09/21/20 1349  AST 19 18  ALT 14 15  ALKPHOS  --  59  BILITOT 0.5 0.5  PROT 7.0 6.9  ALBUMIN  --  4.1   No results for input(s): LIPASE, AMYLASE in the last 8760 hours. No results for input(s): AMMONIA in the last 8760 hours. CBC: Recent Labs    08/30/20 1512 09/21/20 1349  WBC 4.1 4.2  NEUTROABS 2,505 2.4  HGB 13.7 12.7  HCT 40.9 39.4  MCV 91.1 92.5  PLT 173 150   Lipid Panel: Recent Labs    08/30/20 1512  CHOL 164  HDL 78  LDLCALC 71  TRIG 70  CHOLHDL 2.1   No results found for: HGBA1C  Procedures since last visit: No results found.  Assessment/Plan 1. Senile osteoporosis - agrees to bone density, but needed contact info so provided today, prior wrist fx - DG Bone Density; Future  2. History of meningioma -s/p resection, doing ok  3. History of lung cancer -following with Dr. Julien Nordmann here now  4. Recurrent falls -none recently, had some therapy, but now her PTs are both gone and she has not called back to resume with others  5. Benign essential hypertension -bp well controlled, no changes needed  6. Aortic atherosclerosis (HCC) -cont bp, lipid control and monitor  Labs/tests ordered:   Orders Placed This Encounter  Procedures  . DG Bone Density    AETNA  PREV: 5 OR MORE YEARS AGO $RemoveB'@NYC'ZpjXEDHz$   PT WT:109 LBS  PT AWARE NO  CAL/MULTI 48 HRS PRIOR/ NO NEEDS  AJ SW PT  Epic ORDER     Standing Status:   Future    Standing Expiration Date:   06/01/2021    Order Specific Question:   Reason for Exam (SYMPTOM  OR DIAGNOSIS REQUIRED)    Answer:   postmenopausal, estrogen deficiency, known osteoporosis    Order Specific Question:   Preferred imaging location?    Answer:   [facility]    Order Specific Question:   Release to patient    Answer:   Immediate    Next appt:  6 mos med mgt  Reneshia Zuccaro L. Eh Sauseda, D.O. [facility] [address], [place] [address] Cell Phone (Mon-Fri 8am-5pm):  [phone] On Call:  [phone] & follow prompts after 5pm & weekends Office Phone:  [phone] Office Fax:  [phone]

## 2020-12-13 ENCOUNTER — Encounter: Payer: Self-pay | Admitting: Internal Medicine

## 2020-12-13 MED ORDER — ESTRADIOL 0.05 MG/24HR TD PTWK
0.0500 mg | MEDICATED_PATCH | TRANSDERMAL | 4 refills | Status: DC
Start: 2020-12-13 — End: 2021-12-15

## 2021-01-17 ENCOUNTER — Encounter: Payer: Self-pay | Admitting: Internal Medicine

## 2021-02-01 DIAGNOSIS — B009 Herpesviral infection, unspecified: Secondary | ICD-10-CM

## 2021-02-01 DIAGNOSIS — D351 Benign neoplasm of parathyroid gland: Secondary | ICD-10-CM

## 2021-02-01 DIAGNOSIS — D126 Benign neoplasm of colon, unspecified: Secondary | ICD-10-CM | POA: Insufficient documentation

## 2021-02-01 DIAGNOSIS — Z9889 Other specified postprocedural states: Secondary | ICD-10-CM | POA: Insufficient documentation

## 2021-02-01 DIAGNOSIS — C719 Malignant neoplasm of brain, unspecified: Secondary | ICD-10-CM | POA: Insufficient documentation

## 2021-02-01 DIAGNOSIS — C3411 Malignant neoplasm of upper lobe, right bronchus or lung: Secondary | ICD-10-CM

## 2021-02-08 ENCOUNTER — Encounter: Payer: Self-pay | Admitting: Internal Medicine

## 2021-02-08 NOTE — Telephone Encounter (Signed)
Message routed to Dr.Reed to review and advise further if needed

## 2021-02-10 DIAGNOSIS — H401131 Primary open-angle glaucoma, bilateral, mild stage: Secondary | ICD-10-CM | POA: Diagnosis not present

## 2021-03-01 ENCOUNTER — Encounter: Payer: Self-pay | Admitting: Internal Medicine

## 2021-03-08 ENCOUNTER — Ambulatory Visit: Payer: Medicare HMO | Attending: Internal Medicine

## 2021-03-08 ENCOUNTER — Encounter: Payer: Self-pay | Admitting: Internal Medicine

## 2021-03-08 ENCOUNTER — Other Ambulatory Visit: Payer: Self-pay

## 2021-03-08 ENCOUNTER — Other Ambulatory Visit (HOSPITAL_BASED_OUTPATIENT_CLINIC_OR_DEPARTMENT_OTHER): Payer: Self-pay

## 2021-03-08 DIAGNOSIS — Z23 Encounter for immunization: Secondary | ICD-10-CM

## 2021-03-08 MED ORDER — COVID-19 MRNA VACCINE (PFIZER) 30 MCG/0.3ML IM SUSP
INTRAMUSCULAR | 0 refills | Status: DC
  Filled 2021-03-08: qty 0.3, 1d supply, fill #0

## 2021-03-08 NOTE — Progress Notes (Signed)
   Covid-19 Vaccination Clinic  Name:  Jacqueline Hunt    MRN: [id number] DOB: 1928/09/02  03/08/2021  [name] was observed post Covid-19 immunization for 15 minutes without incident. She was provided with Vaccine Information Sheet and instruction to access the V-Safe system.   [name] was instructed to call 911 with any severe reactions post vaccine: Marland Kitchen Difficulty breathing  . Swelling of face and throat  . A fast heartbeat  . A bad rash all over body  . Dizziness and weakness   Immunizations Administered    Name Date Dose VIS Date Route   PFIZER Comrnaty(Gray TOP) Covid-19 Vaccine 03/08/2021 10:45 AM 0.3 mL 11/04/2020 Intramuscular   Manufacturer: [employer]   Lot: [id number]   NDC: [id number]

## 2021-03-11 ENCOUNTER — Other Ambulatory Visit (HOSPITAL_BASED_OUTPATIENT_CLINIC_OR_DEPARTMENT_OTHER): Payer: Self-pay

## 2021-03-17 ENCOUNTER — Other Ambulatory Visit: Payer: Self-pay | Admitting: Internal Medicine

## 2021-03-17 ENCOUNTER — Other Ambulatory Visit: Payer: Medicare HMO

## 2021-03-17 DIAGNOSIS — M810 Age-related osteoporosis without current pathological fracture: Secondary | ICD-10-CM

## 2021-03-18 ENCOUNTER — Other Ambulatory Visit: Payer: Medicare HMO

## 2021-03-19 ENCOUNTER — Encounter: Payer: Self-pay | Admitting: Internal Medicine

## 2021-03-21 ENCOUNTER — Telehealth: Payer: Self-pay

## 2021-03-21 ENCOUNTER — Ambulatory Visit: Payer: Medicare HMO | Admitting: Internal Medicine

## 2021-03-21 NOTE — Telephone Encounter (Signed)
Spoke with pt regarding her upcoming appts in 5/22 as requested. Informed pt that when her CT is approved by prior authorization, we will call her and let her know she can schedule it. Pt verbalized understanding and is appreciative of call back.

## 2021-03-22 ENCOUNTER — Telehealth: Payer: Self-pay

## 2021-03-22 NOTE — Telephone Encounter (Signed)
Spoke with pt to let her know that her scan has been authorized. Central scheduling phone number provided [phone]). Pt verbalizes understanding.

## 2021-03-28 DIAGNOSIS — H903 Sensorineural hearing loss, bilateral: Secondary | ICD-10-CM | POA: Diagnosis not present

## 2021-03-29 ENCOUNTER — Other Ambulatory Visit: Payer: Medicare HMO

## 2021-04-10 DIAGNOSIS — I10 Essential (primary) hypertension: Secondary | ICD-10-CM | POA: Diagnosis not present

## 2021-04-10 DIAGNOSIS — H6121 Impacted cerumen, right ear: Secondary | ICD-10-CM | POA: Diagnosis not present

## 2021-04-11 ENCOUNTER — Encounter: Payer: Self-pay | Admitting: Family Medicine

## 2021-04-15 ENCOUNTER — Ambulatory Visit (HOSPITAL_COMMUNITY)
Admission: RE | Admit: 2021-04-15 | Discharge: 2021-04-15 | Disposition: A | Discharge status: Home / Self Care | Payer: Medicare HMO | Source: Ambulatory Visit | Attending: Internal Medicine | Admitting: Internal Medicine

## 2021-04-15 ENCOUNTER — Inpatient Hospital Stay: Payer: Medicare HMO | Attending: Internal Medicine

## 2021-04-15 ENCOUNTER — Other Ambulatory Visit: Payer: Self-pay

## 2021-04-15 DIAGNOSIS — I2510 Atherosclerotic heart disease of native coronary artery without angina pectoris: Secondary | ICD-10-CM | POA: Insufficient documentation

## 2021-04-15 DIAGNOSIS — I700 Atherosclerosis of aorta: Secondary | ICD-10-CM | POA: Diagnosis not present

## 2021-04-15 DIAGNOSIS — Z902 Acquired absence of lung [part of]: Secondary | ICD-10-CM | POA: Insufficient documentation

## 2021-04-15 DIAGNOSIS — R0609 Other forms of dyspnea: Secondary | ICD-10-CM | POA: Insufficient documentation

## 2021-04-15 DIAGNOSIS — C3411 Malignant neoplasm of upper lobe, right bronchus or lung: Secondary | ICD-10-CM | POA: Insufficient documentation

## 2021-04-15 DIAGNOSIS — R0602 Shortness of breath: Secondary | ICD-10-CM | POA: Insufficient documentation

## 2021-04-15 DIAGNOSIS — I517 Cardiomegaly: Secondary | ICD-10-CM | POA: Diagnosis not present

## 2021-04-15 DIAGNOSIS — M810 Age-related osteoporosis without current pathological fracture: Secondary | ICD-10-CM | POA: Insufficient documentation

## 2021-04-15 DIAGNOSIS — J398 Other specified diseases of upper respiratory tract: Secondary | ICD-10-CM | POA: Diagnosis not present

## 2021-04-15 DIAGNOSIS — J181 Lobar pneumonia, unspecified organism: Secondary | ICD-10-CM | POA: Diagnosis not present

## 2021-04-15 DIAGNOSIS — C3490 Malignant neoplasm of unspecified part of unspecified bronchus or lung: Secondary | ICD-10-CM

## 2021-04-15 DIAGNOSIS — D351 Benign neoplasm of parathyroid gland: Secondary | ICD-10-CM | POA: Insufficient documentation

## 2021-04-15 DIAGNOSIS — Z79899 Other long term (current) drug therapy: Secondary | ICD-10-CM | POA: Insufficient documentation

## 2021-04-15 DIAGNOSIS — R5383 Other fatigue: Secondary | ICD-10-CM | POA: Diagnosis not present

## 2021-04-15 LAB — CBC WITH DIFFERENTIAL (CANCER CENTER ONLY)
Abs Immature Granulocytes: 0.01 10*3/uL (ref 0.00–0.07)
Basophils Absolute: 0 10*3/uL (ref 0.0–0.1)
Basophils Relative: 1 %
Eosinophils Absolute: 0.1 10*3/uL (ref 0.0–0.5)
Eosinophils Relative: 2 %
HCT: 38.6 % (ref 36.0–46.0)
Hemoglobin: 13.1 g/dL (ref 12.0–15.0)
Immature Granulocytes: 0 %
Lymphocytes Relative: 30 %
Lymphs Abs: 1.3 10*3/uL (ref 0.7–4.0)
MCH: 30.5 pg (ref 26.0–34.0)
MCHC: 33.9 g/dL (ref 30.0–36.0)
MCV: 90 fL (ref 80.0–100.0)
Monocytes Absolute: 0.3 10*3/uL (ref 0.1–1.0)
Monocytes Relative: 7 %
Neutro Abs: 2.6 10*3/uL (ref 1.7–7.7)
Neutrophils Relative %: 60 %
Platelet Count: 153 10*3/uL (ref 150–400)
RBC: 4.29 MIL/uL (ref 3.87–5.11)
RDW: 13.1 % (ref 11.5–15.5)
WBC Count: 4.3 10*3/uL (ref 4.0–10.5)
nRBC: 0 % (ref 0.0–0.2)

## 2021-04-15 LAB — CMP (CANCER CENTER ONLY)
ALT: 15 U/L (ref 0–44)
AST: 19 U/L (ref 15–41)
Albumin: 4.1 g/dL (ref 3.5–5.0)
Alkaline Phosphatase: 60 U/L (ref 38–126)
Anion gap: 8 (ref 5–15)
BUN: 19 mg/dL (ref 8–23)
CO2: 27 mmol/L (ref 22–32)
Calcium: 9.5 mg/dL (ref 8.9–10.3)
Chloride: 105 mmol/L (ref 98–111)
Creatinine: 0.8 mg/dL (ref 0.44–1.00)
GFR, Estimated: >60 mL/min (ref >60)
Glucose, Bld: 89 mg/dL (ref 70–99)
Potassium: 4.3 mmol/L (ref 3.5–5.1)
Sodium: 140 mmol/L (ref 135–145)
Total Bilirubin: 0.6 mg/dL (ref 0.3–1.2)
Total Protein: 7.1 g/dL (ref 6.5–8.1)

## 2021-04-15 MED ORDER — SODIUM CHLORIDE (PF) 0.9 % IJ SOLN
INTRAMUSCULAR | Status: AC
  Filled 2021-04-15: qty 50

## 2021-04-15 MED ORDER — IOHEXOL 300 MG/ML  SOLN
75.0000 mL | Freq: Once | INTRAMUSCULAR | Status: AC | PRN
  Administered 2021-04-15: 75 mL via INTRAVENOUS

## 2021-04-18 ENCOUNTER — Other Ambulatory Visit: Payer: Self-pay

## 2021-04-18 ENCOUNTER — Inpatient Hospital Stay: Payer: Medicare HMO | Admitting: Internal Medicine

## 2021-04-18 VITALS — BP 146/92 | HR 81 | Temp 97.9°F | Resp 18 | Ht 61.0 in | Wt 111.9 lb

## 2021-04-18 DIAGNOSIS — R0609 Other forms of dyspnea: Secondary | ICD-10-CM | POA: Diagnosis not present

## 2021-04-18 DIAGNOSIS — C3411 Malignant neoplasm of upper lobe, right bronchus or lung: Secondary | ICD-10-CM | POA: Diagnosis not present

## 2021-04-18 DIAGNOSIS — Z79899 Other long term (current) drug therapy: Secondary | ICD-10-CM | POA: Diagnosis not present

## 2021-04-18 DIAGNOSIS — I517 Cardiomegaly: Secondary | ICD-10-CM | POA: Diagnosis not present

## 2021-04-18 DIAGNOSIS — C3490 Malignant neoplasm of unspecified part of unspecified bronchus or lung: Secondary | ICD-10-CM | POA: Diagnosis not present

## 2021-04-18 DIAGNOSIS — M810 Age-related osteoporosis without current pathological fracture: Secondary | ICD-10-CM | POA: Diagnosis not present

## 2021-04-18 DIAGNOSIS — I700 Atherosclerosis of aorta: Secondary | ICD-10-CM | POA: Diagnosis not present

## 2021-04-18 DIAGNOSIS — I2510 Atherosclerotic heart disease of native coronary artery without angina pectoris: Secondary | ICD-10-CM | POA: Diagnosis not present

## 2021-04-18 DIAGNOSIS — D351 Benign neoplasm of parathyroid gland: Secondary | ICD-10-CM | POA: Diagnosis not present

## 2021-04-18 DIAGNOSIS — R5383 Other fatigue: Secondary | ICD-10-CM | POA: Diagnosis not present

## 2021-04-18 DIAGNOSIS — C3491 Malignant neoplasm of unspecified part of right bronchus or lung: Secondary | ICD-10-CM

## 2021-04-18 DIAGNOSIS — R0602 Shortness of breath: Secondary | ICD-10-CM | POA: Diagnosis not present

## 2021-04-18 NOTE — Progress Notes (Signed)
[facility] [phone]   [phone]  OFFICE PROGRESS NOTE  Jacqueline Honour, MD [place] [address]  DIAGNOSIS: stage Ib (T2 a, N0, M0) non-small cell lung cancer, adenocarcinoma predominantly acinar with lipidic features diagnosed in August 2020. Molecular studies showed negative for EGFR mutation and negative PD-L1 expression.  PRIOR THERAPY:  Status post right upper lobectomy with lymph node dissection in [place]).S  CURRENT THERAPY: Observation.  INTERVAL HISTORY: Jacqueline Hunt 87 y.o. female returns to the clinic today for follow-up visit.  The patient is feeling fine today with no concerning complaints except for occasional shortness of breath as well as fatigue.  She denied having any current chest pain, cough or hemoptysis.  She denied having any fever or chills.  She has no nausea, vomiting, diarrhea or constipation.  She denied having any headache or visual changes.  She has no significant weight loss or night sweats.  She had repeat CT scan of the chest performed recently and she is here for evaluation and discussion of her risk her results.  MEDICAL HISTORY: Past Medical History:  Diagnosis Date  . Arthritis   . Brain tumor ([place])   . High blood pressure   . Lung cancer ([place])   . Osteopenia   . Osteoporosis   . Parathyroid adenoma     ALLERGIES:  has No Known Allergies.  MEDICATIONS:  Current Outpatient Medications  Medication Sig Dispense Refill  . ascorbic acid (VITAMIN C) 1000 MG tablet Take 1,000 mg by mouth daily.    Marland Kitchen aspirin EC 81 MG tablet Take 81 mg by mouth daily.    . Cholecalciferol (VITAMIN D3 PO) Take 500 mg by mouth daily.    Marland Kitchen COVID-19 mRNA vaccine, Pfizer, 30 MCG/0.3ML injection Inject into the muscle. 0.3 mL 0  . estradiol (CLIMARA - DOSED IN MG/24 HR) 0.05 mg/24hr patch Place 1 patch (0.05 mg total) onto the skin once a week. 4 patch 4  . latanoprost (XALATAN) 0.005 % ophthalmic  solution Place 1 drop into both eyes at bedtime.    Marland Kitchen lisinopril (ZESTRIL) 10 MG tablet Take 10 mg by mouth 2 (two) times daily.    . Multiple Vitamins-Minerals (DAILY MULTIVITAMIN PO) Take 1 tablet by mouth daily.    . rosuvastatin (CRESTOR) 5 MG tablet Take 5 mg by mouth every evening.    . vitamin B-12 (CYANOCOBALAMIN) 500 MCG tablet Take 500 mcg by mouth daily.    . zaleplon (SONATA) 10 MG capsule Take 10 mg by mouth.     No current facility-administered medications for this visit.    SURGICAL HISTORY:  Past Surgical History:  Procedure Laterality Date  . brain meningioma excesion    . CATARACT EXTRACTION    . CESAREAN SECTION    . GALLBLADDER SURGERY    . gallbladder surgery     . LOBECTOMY    . TONSILLECTOMY    . TONSILLECTOMY    . WRIST FRACTURE SURGERY      REVIEW OF SYSTEMS:  A comprehensive review of systems was negative except for: Constitutional: positive for fatigue Respiratory: positive for dyspnea on exertion   PHYSICAL EXAMINATION: General appearance: alert, cooperative, fatigued and no distress Head: Normocephalic, without obvious abnormality, atraumatic Neck: no adenopathy, no JVD, supple, symmetrical, trachea midline and thyroid not enlarged, symmetric, no tenderness/mass/nodules Lymph nodes: Cervical, supraclavicular, and axillary nodes normal. Resp: clear to auscultation bilaterally Back: symmetric, no curvature. ROM normal. No  CVA tenderness. Cardio: regular rate and rhythm, S1, S2 normal, no murmur, click, rub or gallop GI: soft, non-tender; bowel sounds normal; no masses,  no organomegaly Extremities: extremities normal, atraumatic, no cyanosis or edema  ECOG PERFORMANCE STATUS: 1 - Symptomatic but completely ambulatory  Blood pressure (!) 146/92, pulse 81, temperature 97.9 F (36.6 C), temperature source Tympanic, resp. rate 18, height $RemoveBe'5\' 1"'EnLcuANKr$  (1.549 m), weight 111 lb 14.4 oz (50.8 kg), SpO2 100 %.  LABORATORY DATA: Lab Results  Component Value Date    WBC 4.3 04/15/2021   HGB 13.1 04/15/2021   HCT 38.6 04/15/2021   MCV 90.0 04/15/2021   PLT 153 04/15/2021      Chemistry      Component Value Date/Time   NA 140 04/15/2021 1051   K 4.3 04/15/2021 1051   CL 105 04/15/2021 1051   CO2 27 04/15/2021 1051   BUN 19 04/15/2021 1051   BUN 20 03/12/2020 0000   CREATININE 0.80 04/15/2021 1051   CREATININE 0.67 08/30/2020 1512   GLU 179 03/12/2020 0000      Component Value Date/Time   CALCIUM 9.5 04/15/2021 1051   ALKPHOS 60 04/15/2021 1051   AST 19 04/15/2021 1051   ALT 15 04/15/2021 1051   BILITOT 0.6 04/15/2021 1051       RADIOGRAPHIC STUDIES: CT Chest W Contrast  Result Date: 04/17/2021 CLINICAL DATA:  Non-small cell lung cancer restaging, status post right upper lobectomy EXAM: CT CHEST WITH CONTRAST TECHNIQUE: Multidetector CT imaging of the chest was performed during intravenous contrast administration. CONTRAST:  36mL OMNIPAQUE IOHEXOL 300 MG/ML  SOLN COMPARISON:  10/13/2020 FINDINGS: Cardiovascular: Aortic atherosclerosis. Unchanged enlargement of the tubular ascending thoracic aorta, measuring 4.2 x 4.2 cm. Mild cardiomegaly. Three-vessel coronary artery calcifications. No pericardial effusion. Mediastinum/Nodes: No enlarged mediastinal, hilar, or axillary lymph nodes. Unchanged right-sided tracheal diverticulum (series 7, image 17). Thyroid gland and esophagus demonstrate no significant findings. Lungs/Pleura: Redemonstrated postoperative findings of right upper lobectomy. Occasional small pulmonary nodules are unchanged, for example a 5 mm nodule of the posterior left pulmonary apex (series 7, image 27). Redemonstrated bronchial plugging and small consolidations in the right middle lobe (series 7, image 80). No pleural effusion or pneumothorax. Upper Abdomen: No acute abnormality. Musculoskeletal: No chest wall mass or suspicious bone lesions identified. Pectus deformity. IMPRESSION: 1. Redemonstrated postoperative findings of  right upper lobectomy. No evidence of malignant recurrence or metastatic disease in the chest. 2. Occasional small pulmonary nodules measuring 5 mm and smaller, unchanged. These remain nonspecific but most likely infectious or inflammatory. Attention on follow-up. 3. Redemonstrated bronchial plugging and small consolidations in the right middle lobe, consistent with atypical infection, particularly atypical mycobacterium. 4. Unchanged enlargement of the tubular ascending thoracic aorta, measuring 4.2 x 4.2 cm. Recommend annual imaging followup by CTA or MRA. This recommendation follows 2010 ACCF/AHA/AATS/ACR/ASA/SCA/SCAI/SIR/STS/SVM Guidelines for the Diagnosis and Management of Patients with Thoracic Aortic Disease. Circulation. 2010; 121: B353-G992. Aortic aneurysm NOS (ICD10-I71.9) 5. Coronary artery disease. Aortic Atherosclerosis (ICD10-I70.0). Electronically Signed   By: Eddie Candle M.D.   On: 04/17/2021 20:46    ASSESSMENT AND PLAN: This is a very pleasant 87 years old white female diagnosed with stage Ib (T2 a, N0, M0) non-small cell lung cancer, adenocarcinoma predominantly acinar with lipidic features diagnosed in August 2020 status post right upper lobectomy with lymph node dissection in [place]). Molecular studies showed negative for EGFR mutation and negative PD-L1 expression. The patient is currently on observation and she is feeling  fine today with no concerning complaints except for the baseline fatigue and shortness of breath She had repeat CT scan of the chest performed recently.  I personally and independently reviewed the scans and discussed the results with the patient today. Her scan showed no concerning findings for disease recurrence or metastasis. I recommended for her to continue on observation with repeat CT scan of the chest in 6 months. The patient was advised to call immediately if she has any concerning symptoms in the interval. The patient voices understanding of  current disease status and treatment options and is in agreement with the current care plan.  All questions were answered. The patient knows to call the clinic with any problems, questions or concerns. We can certainly see the patient much sooner if necessary.  The total time spent in the appointment was 20 minutes.  Disclaimer: This note was dictated with voice recognition software. Similar sounding words can inadvertently be transcribed and may not be corrected upon review.

## 2021-04-19 ENCOUNTER — Encounter: Payer: Self-pay | Admitting: Family Medicine

## 2021-04-19 ENCOUNTER — Other Ambulatory Visit: Payer: Self-pay

## 2021-04-19 ENCOUNTER — Ambulatory Visit (INDEPENDENT_AMBULATORY_CARE_PROVIDER_SITE_OTHER): Payer: Medicare HMO | Admitting: Family Medicine

## 2021-04-19 ENCOUNTER — Ambulatory Visit
Admission: RE | Admit: 2021-04-19 | Discharge: 2021-04-19 | Disposition: A | Discharge status: Home / Self Care | Payer: Medicare HMO | Source: Ambulatory Visit | Attending: Family Medicine | Admitting: Family Medicine

## 2021-04-19 ENCOUNTER — Telehealth: Payer: Self-pay | Admitting: Internal Medicine

## 2021-04-19 VITALS — BP 142/84 | HR 77 | Temp 96.0°F | Ht 61.0 in | Wt 114.0 lb

## 2021-04-19 DIAGNOSIS — M25531 Pain in right wrist: Secondary | ICD-10-CM | POA: Diagnosis not present

## 2021-04-19 DIAGNOSIS — M25539 Pain in unspecified wrist: Secondary | ICD-10-CM | POA: Insufficient documentation

## 2021-04-19 DIAGNOSIS — I10 Essential (primary) hypertension: Secondary | ICD-10-CM | POA: Diagnosis not present

## 2021-04-19 NOTE — Progress Notes (Signed)
Provider:  Alain Honey, MD  Careteam: Patient Care Team: [name], MD as PCP - General (Family Medicine) Harlene Ramus (Podiatry)  PLACE OF SERVICE:  [facility] Directive information    No Known Allergies  Chief Complaint  Patient presents with  . Acute Visit    Patient reports right wrist pain that has worsen. She reports pain started 2-3 weeks ago. She denies any falls. Patient states she has not taking anything for the wrist pain. She reports dominant hand is right.     HPI: Patient is a 87 y.o. female patient presents today with concerns about her blood pressure as well as pain in her right wrist. [facility] and she was told her pressure was a little elevated.  She does not recall any specific numbers.  She continues to take lisinopril 10 mg twice daily. Regarding her right wrist there is no history of injury or fall but there is tenderness and pain on the radial aspect.  Review of Systems:  Review of Systems  Musculoskeletal: Positive for joint pain.       Right wrist  All other systems reviewed and are negative.   Past Medical History:  Diagnosis Date  . Arthritis   . Brain tumor ([place])   . High blood pressure   . Lung cancer ([facility])   . Osteopenia   . Osteoporosis   . Parathyroid adenoma    Past Surgical History:  Procedure Laterality Date  . brain meningioma excesion    . CATARACT EXTRACTION    . CESAREAN SECTION    . GALLBLADDER SURGERY    . gallbladder surgery     . LOBECTOMY    . TONSILLECTOMY    . TONSILLECTOMY    . WRIST FRACTURE SURGERY     Social History:   reports that she quit smoking about 58 years ago. She has a 57.10 pack-year smoking history. She has never used smokeless tobacco. She reports that she does not drink alcohol and does not use drugs.  History reviewed. No pertinent family history.  Medications: Patient's Medications  New  Prescriptions   No medications on file  Previous Medications   ASCORBIC ACID (VITAMIN C) 1000 MG TABLET    Take 1,000 mg by mouth daily.   ASPIRIN EC 81 MG TABLET    Take 81 mg by mouth daily.   CHOLECALCIFEROL (VITAMIN D3 PO)    Take 500 mg by mouth daily.   COVID-19 MRNA VACCINE, PFIZER, 30 MCG/0.3ML INJECTION    Inject into the muscle.   DOXYLAMINE SUCCINATE, SLEEP, (UNISOM PO)    Take by mouth.   ESTRADIOL (CLIMARA - DOSED IN MG/24 HR) 0.05 MG/24HR PATCH    Place 1 patch (0.05 mg total) onto the skin once a week.   LATANOPROST (XALATAN) 0.005 % OPHTHALMIC SOLUTION    Place 1 drop into both eyes at bedtime.   LISINOPRIL (ZESTRIL) 10 MG TABLET    Take 10 mg by mouth 2 (two) times daily.   MULTIPLE VITAMINS-MINERALS (DAILY MULTIVITAMIN PO)    Take 1 tablet by mouth daily.   ROSUVASTATIN (CRESTOR) 5 MG TABLET    Take 5 mg by mouth every evening.   VITAMIN B-12 (CYANOCOBALAMIN) 500 MCG TABLET    Take 500 mcg by mouth daily.   ZALEPLON (SONATA) 10 MG CAPSULE    Take 10 mg by mouth.  Modified Medications   No  medications on file  Discontinued Medications   No medications on file    Physical Exam:  Vitals:   04/19/21 1059  BP: (!) 142/84  Pulse: 77  Temp: (!) 96 F (35.6 C)  TempSrc: Temporal  SpO2: 98%  Weight: 114 lb (51.7 kg)  Height: 5\' 1"  (1.549 m)   Body mass index is 21.54 kg/m. Wt Readings from Last 3 Encounters:  04/19/21 114 lb (51.7 kg)  04/18/21 111 lb 14.4 oz (50.8 kg)  12/02/20 109 lb 6.4 oz (49.6 kg)    Physical Exam Nursing note reviewed.  Constitutional:      Appearance: Normal appearance.  Cardiovascular:     Rate and Rhythm: Normal rate and regular rhythm.  Pulmonary:     Effort: Pulmonary effort is normal.     Breath sounds: Normal breath sounds.  Musculoskeletal:     Comments: Right wrist: There is no deformity Pain increases with plantar and dorsiflexion.  There is also a positive Phalen's test suggesting radial tenosynovitis  Neurological:      General: No focal deficit present.     Mental Status: She is alert and oriented to person, place, and time.     Labs reviewed: Basic Metabolic Panel: Recent Labs    08/30/20 1512 09/21/20 1349 04/15/21 1051  NA 140 140 140  K 4.0 3.9 4.3  CL 102 104 105  CO2 29 28 27   GLUCOSE 80 98 89  BUN 18 19 19   CREATININE 0.67 0.76 0.80  CALCIUM 9.8 9.5 9.5   Liver Function Tests: Recent Labs    08/30/20 1512 09/21/20 1349 04/15/21 1051  AST 19 18 19   ALT 14 15 15   ALKPHOS  --  59 60  BILITOT 0.5 0.5 0.6  PROT 7.0 6.9 7.1  ALBUMIN  --  4.1 4.1   No results for input(s): LIPASE, AMYLASE in the last 8760 hours. No results for input(s): AMMONIA in the last 8760 hours. CBC: Recent Labs    08/30/20 1512 09/21/20 1349 04/15/21 1051  WBC 4.1 4.2 4.3  NEUTROABS 2,505 2.4 2.6  HGB 13.7 12.7 13.1  HCT 40.9 39.4 38.6  MCV 91.1 92.5 90.0  PLT 173 150 153   Lipid Panel: Recent Labs    08/30/20 1512  CHOL 164  HDL 78  LDLCALC 71  TRIG 70  CHOLHDL 2.1   TSH: No results for input(s): TSH in the last 8760 hours. A1C: No results found for: HGBA1C   Assessment/Plan  1. Right wrist pain Differential diagnosis would be degenerative arthritis versus the tenosynovitis, which I favor. - DG Wrist Complete Right; Future  2. Benign essential hypertension Blood pressure is okay here today.  I would like her to monitor pressure at home for 2 weeks checking some in the morning and some in the afternoon or evening.  Once we look at those results we will decide if medication needs to be changed     Alain Honey, MD [place] [phone]

## 2021-04-19 NOTE — Patient Instructions (Signed)
Monitor blood pressure daily alternating morning and evening checks

## 2021-04-19 NOTE — Telephone Encounter (Signed)
Scheduled per los. Called, not able to leave msg. Mailed printout  

## 2021-04-21 ENCOUNTER — Telehealth: Payer: Self-pay | Admitting: *Deleted

## 2021-04-21 NOTE — Telephone Encounter (Signed)
Received call from Dr. Alain Honey regarding patient's Hand X-Ray:  Negative. Degenerative Changes.   Per Dr. Sabra Heck: Patient would benefit from a thumb Spika Splint from pharmacy. Wear 24/7 and take off for baths. Call back in a couple of weeks to let us know how you doing.      LMOM for patient to return call regarding Results.

## 2021-04-21 NOTE — Telephone Encounter (Signed)
LMOM to return call.

## 2021-04-22 NOTE — Telephone Encounter (Signed)
LMOM to return call.

## 2021-04-26 NOTE — Telephone Encounter (Signed)
Patient notified and agreed.   Patient stated that she wanted to let you know that she has not been able to monitor her blood pressures because she had to order a new machine and it comes today.  Patient is wanting to know if you want her to reschedule her appointment.  Please Advise.

## 2021-04-27 NOTE — Telephone Encounter (Signed)
[name], MD  You 25 minutes ago (11:47 AM)   Lets postpone visit till she has had 2 weeks to monitor blood pressure   Message text      LMOM to return call.

## 2021-04-28 ENCOUNTER — Ambulatory Visit
Admission: RE | Admit: 2021-04-28 | Discharge: 2021-04-28 | Disposition: A | Discharge status: Home / Self Care | Payer: Medicare HMO | Source: Ambulatory Visit | Attending: Family Medicine | Admitting: Family Medicine

## 2021-04-28 ENCOUNTER — Other Ambulatory Visit: Payer: Self-pay

## 2021-04-28 ENCOUNTER — Encounter: Payer: Self-pay | Admitting: Family Medicine

## 2021-04-28 DIAGNOSIS — M810 Age-related osteoporosis without current pathological fracture: Secondary | ICD-10-CM

## 2021-04-28 DIAGNOSIS — M85852 Other specified disorders of bone density and structure, left thigh: Secondary | ICD-10-CM | POA: Diagnosis not present

## 2021-04-28 DIAGNOSIS — Z780 Asymptomatic menopausal state: Secondary | ICD-10-CM | POA: Diagnosis not present

## 2021-04-29 NOTE — Telephone Encounter (Signed)
Patient notified and agreed. Scheduled an appointment for 05/18/2021

## 2021-05-04 ENCOUNTER — Ambulatory Visit: Payer: Medicare HMO | Admitting: Family Medicine

## 2021-05-18 ENCOUNTER — Encounter: Payer: Self-pay | Admitting: Family Medicine

## 2021-05-18 ENCOUNTER — Other Ambulatory Visit: Payer: Self-pay

## 2021-05-18 ENCOUNTER — Ambulatory Visit (INDEPENDENT_AMBULATORY_CARE_PROVIDER_SITE_OTHER): Payer: Medicare HMO | Admitting: Family Medicine

## 2021-05-18 VITALS — BP 120/70 | HR 84 | Temp 97.5°F | Wt 111.4 lb

## 2021-05-18 DIAGNOSIS — M8949 Other hypertrophic osteoarthropathy, multiple sites: Secondary | ICD-10-CM

## 2021-05-18 DIAGNOSIS — M159 Polyosteoarthritis, unspecified: Secondary | ICD-10-CM

## 2021-05-18 DIAGNOSIS — I10 Essential (primary) hypertension: Secondary | ICD-10-CM

## 2021-05-18 NOTE — Patient Instructions (Signed)
Wear thumb spica splint Take Aleve twice a day with food Drink plenty water

## 2021-05-18 NOTE — Progress Notes (Signed)
Provider:  Alain Honey, MD  Careteam: Patient Care Team: [name], MD as PCP - General (Family Medicine) Harlene Ramus (Podiatry)  PLACE OF SERVICE:  [facility] Directive information    No Known Allergies  Chief Complaint  Patient presents with   Medical Management of Chronic Issues    Patient presents today for a 1 month follow-up for hypertension. She reports blood pressure readings at are around 90's-120's/60's-70's.     HPI: Patient is a 87 y.o. female patient is here to follow-up her right wrist pain as well as her blood pressure.  She did require a cock-up splint which does not include the thumb.  X-rays after office visit showed some degenerative changes but no fractures.  Diagnosis of radial tenosynovitis is likely. She is also been monitoring her pressure.  Readings have been in the 01U to 272 systolic and 53G to 64Q diastolic.  She continues with lisinopril 10 mg twice daily.  Review of Systems:  Review of Systems  Musculoskeletal:  Positive for joint pain.       Right wrist pain  All other systems reviewed and are negative.  Past Medical History:  Diagnosis Date   Arthritis    Brain tumor ([place])    High blood pressure    Lung cancer ([place])    Osteopenia    Osteoporosis    Parathyroid adenoma    Past Surgical History:  Procedure Laterality Date   brain meningioma excesion     CATARACT EXTRACTION     CESAREAN SECTION     GALLBLADDER SURGERY     gallbladder surgery      LOBECTOMY     TONSILLECTOMY     TONSILLECTOMY     WRIST FRACTURE SURGERY     Social History:   reports that she quit smoking about 58 years ago. She has a 57.10 pack-year smoking history. She has never used smokeless tobacco. She reports that she does not drink alcohol and does not use drugs.  History reviewed. No pertinent family history.  Medications: Patient's Medications  New Prescriptions   No medications on file  Previous Medications   ASPIRIN EC  81 MG TABLET    Take 81 mg by mouth daily.   CHOLECALCIFEROL (VITAMIN D3 PO)    Take 500 mg by mouth daily.   DOXYLAMINE SUCCINATE, SLEEP, (UNISOM PO)    Take by mouth.   ESTRADIOL (CLIMARA - DOSED IN MG/24 HR) 0.05 MG/24HR PATCH    Place 1 patch (0.05 mg total) onto the skin once a week.   LATANOPROST (XALATAN) 0.005 % OPHTHALMIC SOLUTION    Place 1 drop into both eyes at bedtime.   LISINOPRIL (ZESTRIL) 10 MG TABLET    Take 10 mg by mouth 2 (two) times daily.   MULTIPLE VITAMINS-MINERALS (DAILY MULTIVITAMIN PO)    Take 1 tablet by mouth daily.   ROSUVASTATIN (CRESTOR) 5 MG TABLET    Take 5 mg by mouth every evening.   VITAMIN B-12 (CYANOCOBALAMIN) 500 MCG TABLET    Take 500 mcg by mouth daily.  Modified Medications   No medications on file  Discontinued Medications   ASCORBIC ACID (VITAMIN C) 1000 MG TABLET    Take 1,000 mg by mouth daily.   COVID-19 MRNA VACCINE, PFIZER, 30 MCG/0.3ML INJECTION    Inject into the muscle.   ZALEPLON (SONATA) 10 MG CAPSULE    Take 10 mg by mouth.    Physical Exam:  Vitals:   05/18/21  1019  BP: 120/70  Pulse: 84  Temp: (!) 97.5 F (36.4 C)  TempSrc: Temporal  SpO2: 98%  Weight: 111 lb 6.4 oz (50.5 kg)   Body mass index is 21.05 kg/m. Wt Readings from Last 3 Encounters:  05/18/21 111 lb 6.4 oz (50.5 kg)  04/19/21 114 lb (51.7 kg)  04/18/21 111 lb 14.4 oz (50.8 kg)    Physical Exam Vitals and nursing note reviewed.  Constitutional:      Appearance: Normal appearance.  Cardiovascular:     Rate and Rhythm: Normal rate and regular rhythm.  Pulmonary:     Effort: Pulmonary effort is normal.     Breath sounds: Normal breath sounds.  Musculoskeletal:        General: Normal range of motion.     Comments: Tenderness along radial aspect of right wrist  Neurological:     General: No focal deficit present.     Mental Status: She is alert and oriented to person, place, and time.    Labs reviewed: Basic Metabolic Panel: Recent Labs     08/30/20 1512 09/21/20 1349 04/15/21 1051  NA 140 140 140  K 4.0 3.9 4.3  CL 102 104 105  CO2 29 28 27   GLUCOSE 80 98 89  BUN 18 19 19   CREATININE 0.67 0.76 0.80  CALCIUM 9.8 9.5 9.5   Liver Function Tests: Recent Labs    08/30/20 1512 09/21/20 1349 04/15/21 1051  AST 19 18 19   ALT 14 15 15   ALKPHOS  --  59 60  BILITOT 0.5 0.5 0.6  PROT 7.0 6.9 7.1  ALBUMIN  --  4.1 4.1   No results for input(s): LIPASE, AMYLASE in the last 8760 hours. No results for input(s): AMMONIA in the last 8760 hours. CBC: Recent Labs    08/30/20 1512 09/21/20 1349 04/15/21 1051  WBC 4.1 4.2 4.3  NEUTROABS 2,505 2.4 2.6  HGB 13.7 12.7 13.1  HCT 40.9 39.4 38.6  MCV 91.1 92.5 90.0  PLT 173 150 153   Lipid Panel: Recent Labs    08/30/20 1512  CHOL 164  HDL 78  LDLCALC 71  TRIG 70  CHOLHDL 2.1   TSH: No results for input(s): TSH in the last 8760 hours. A1C: No results found for: HGBA1C   Assessment/Plan  1. Primary osteoarthritis involving multiple joints Wrist pain may be related to osteoarthritis but also could be radial tenosynovitis.  We have located a thumb spica splint for her to wear and she will begin Aleve 1 tablet twice daily with food with warning to drink plenty of water  2. Benign essential hypertension Blood pressure is well controlled on lisinopril with no side effects.  Continue same   Alain Honey, MD [facility] [phone]

## 2021-05-31 ENCOUNTER — Encounter: Payer: Self-pay | Admitting: Family Medicine

## 2021-05-31 ENCOUNTER — Ambulatory Visit (INDEPENDENT_AMBULATORY_CARE_PROVIDER_SITE_OTHER): Payer: Medicare HMO | Admitting: Family Medicine

## 2021-05-31 ENCOUNTER — Ambulatory Visit: Payer: Medicare HMO | Admitting: Family Medicine

## 2021-05-31 ENCOUNTER — Other Ambulatory Visit: Payer: Self-pay

## 2021-05-31 VITALS — BP 118/86 | HR 84 | Temp 97.3°F

## 2021-05-31 DIAGNOSIS — R059 Cough, unspecified: Secondary | ICD-10-CM | POA: Diagnosis not present

## 2021-05-31 DIAGNOSIS — Z20822 Contact with and (suspected) exposure to covid-19: Secondary | ICD-10-CM | POA: Diagnosis not present

## 2021-05-31 LAB — POCT INFLUENZA A/B
Influenza A, POC: NEGATIVE
Influenza B, POC: NEGATIVE

## 2021-05-31 MED ORDER — BENZONATATE 100 MG PO CAPS: 100.0000 mg | ORAL_CAPSULE | Freq: Two times a day (BID) | ORAL | 0 refills | Status: DC | PRN

## 2021-05-31 NOTE — Progress Notes (Signed)
Provider:  Alain Honey, MD  Careteam: Patient Care Team: [name], MD as PCP - General (Family Medicine) Harlene Ramus (Podiatry)  PLACE OF SERVICE:  [facility] Directive information    No Known Allergies  Chief Complaint  Patient presents with   Acute Visit    Patient presents today for cough, chills, body aches and diarrhea. She reports that symptoms started on Sunday,05/29/21. Patient reports she had a know exposure to covid from her son and daughter in law.     HPI: Patient is a 87 y.o. female presents today with cough chills myalgias and diarrhea.  Was exposed to [place] from son and daughter-in-law who spending last week with them at the beach.  They tested positive on Saturday.  She did a home COVID test which was negative but she is requesting PCR. Cough is described as nonproductive.  She has not really had any fever in talking to her.  She has been immunized against COVID with 2 boosters at this time.  Review of Systems:  Review of Systems  Constitutional:  Positive for chills and malaise/fatigue.  Respiratory:  Positive for cough.   Musculoskeletal:  Positive for myalgias.  Psychiatric/Behavioral: Negative.    All other systems reviewed and are negative.  Past Medical History:  Diagnosis Date   Arthritis    Brain tumor ([place])    High blood pressure    Lung cancer ([place])    Osteopenia    Osteoporosis    Parathyroid adenoma    Past Surgical History:  Procedure Laterality Date   brain meningioma excesion     CATARACT EXTRACTION     CESAREAN SECTION     GALLBLADDER SURGERY     gallbladder surgery      LOBECTOMY     TONSILLECTOMY     TONSILLECTOMY     WRIST FRACTURE SURGERY     Social History:   reports that she quit smoking about 58 years ago. She has a 57.10 pack-year smoking history. She has never used smokeless tobacco. She reports that she does not drink alcohol and does not use drugs.  History reviewed. No pertinent family  history.  Medications: Patient's Medications  New Prescriptions   No medications on file  Previous Medications   ASPIRIN EC 81 MG TABLET    Take 81 mg by mouth daily.   CHOLECALCIFEROL (VITAMIN D3 PO)    Take 500 mg by mouth daily.   DOXYLAMINE SUCCINATE, SLEEP, (UNISOM PO)    Take by mouth.   ESTRADIOL (CLIMARA - DOSED IN MG/24 HR) 0.05 MG/24HR PATCH    Place 1 patch (0.05 mg total) onto the skin once a week.   LATANOPROST (XALATAN) 0.005 % OPHTHALMIC SOLUTION    Place 1 drop into both eyes at bedtime.   LISINOPRIL (ZESTRIL) 10 MG TABLET    Take 10 mg by mouth 2 (two) times daily.   MULTIPLE VITAMINS-MINERALS (DAILY MULTIVITAMIN PO)    Take 1 tablet by mouth daily.   ROSUVASTATIN (CRESTOR) 5 MG TABLET    Take 5 mg by mouth every evening.   VITAMIN B-12 (CYANOCOBALAMIN) 500 MCG TABLET    Take 500 mcg by mouth daily.  Modified Medications   No medications on file  Discontinued Medications   No medications on file    Physical Exam:  Vitals:   05/31/21 1125  BP: 118/86  Pulse: 84  Temp: (!) 97.3 F (36.3 C)  TempSrc: Temporal  SpO2: 98%   There  is no height or weight on file to calculate BMI. Wt Readings from Last 3 Encounters:  05/18/21 111 lb 6.4 oz (50.5 kg)  04/19/21 114 lb (51.7 kg)  04/18/21 111 lb 14.4 oz (50.8 kg)    Physical Exam Vitals and nursing note reviewed.  Constitutional:      Appearance: Normal appearance.  HENT:     Head: Normocephalic.     Right Ear: Tympanic membrane normal.     Left Ear: Tympanic membrane normal.     Mouth/Throat:     Mouth: Mucous membranes are moist.     Pharynx: Oropharynx is clear.  Cardiovascular:     Rate and Rhythm: Normal rate and regular rhythm.  Pulmonary:     Effort: Pulmonary effort is normal.     Breath sounds: Normal breath sounds.  Musculoskeletal:     Cervical back: Normal range of motion.  Neurological:     General: No focal deficit present.     Mental Status: She is alert and oriented to person, place,  and time.    Labs reviewed: Basic Metabolic Panel: Recent Labs    08/30/20 1512 09/21/20 1349 04/15/21 1051  NA 140 140 140  K 4.0 3.9 4.3  CL 102 104 105  CO2 29 28 27   GLUCOSE 80 98 89  BUN 18 19 19   CREATININE 0.67 0.76 0.80  CALCIUM 9.8 9.5 9.5   Liver Function Tests: Recent Labs    08/30/20 1512 09/21/20 1349 04/15/21 1051  AST 19 18 19   ALT 14 15 15   ALKPHOS  --  59 60  BILITOT 0.5 0.5 0.6  PROT 7.0 6.9 7.1  ALBUMIN  --  4.1 4.1   No results for input(s): LIPASE, AMYLASE in the last 8760 hours. No results for input(s): AMMONIA in the last 8760 hours. CBC: Recent Labs    08/30/20 1512 09/21/20 1349 04/15/21 1051  WBC 4.1 4.2 4.3  NEUTROABS 2,505 2.4 2.6  HGB 13.7 12.7 13.1  HCT 40.9 39.4 38.6  MCV 91.1 92.5 90.0  PLT 173 150 153   Lipid Panel: Recent Labs    08/30/20 1512  CHOL 164  HDL 78  LDLCALC 71  TRIG 70  CHOLHDL 2.1   TSH: No results for input(s): TSH in the last 8760 hours. A1C: No results found for: HGBA1C   Assessment/Plan  1. Exposure to COVID-19 virus Rapid flu test is negative today.  We will do the PCR for COVID.  Patient is requesting oral antiviral but I believe COVID test is positive before treatment can be initiated.  If test is negative today would repeat first of next week - POCT Influenza A/B - SARS-COV-2 RNA,(COVID-19) QUAL NAAT  2. Cough Treat cough symptomatically with Tessalon Perles   Alain Honey, MD [facility] [phone]

## 2021-06-01 ENCOUNTER — Telehealth: Payer: Self-pay | Admitting: *Deleted

## 2021-06-01 ENCOUNTER — Other Ambulatory Visit: Payer: Self-pay | Admitting: Family Medicine

## 2021-06-01 DIAGNOSIS — U071 COVID-19: Secondary | ICD-10-CM

## 2021-06-01 LAB — SARS-COV-2 RNA,(COVID-19) QUALITATIVE NAAT: SARS CoV2 RNA: DETECTED — AB

## 2021-06-01 MED ORDER — NIRMATRELVIR/RITONAVIR (PAXLOVID)TABLET: 3.0000 | ORAL_TABLET | Freq: Two times a day (BID) | ORAL | 0 refills | Status: AC

## 2021-06-01 NOTE — Telephone Encounter (Signed)
Jacqueline Honour, MD  You 45 minutes ago (11:23 AM)   I sent Rx to her pharmacy      Patient aware.

## 2021-06-01 NOTE — Telephone Encounter (Signed)
Patient called and stated that she has tested POSITIVE for Covid on a Home testing kit this morning and is requesting the oral Antiviral medication to be called to her pharmacy.   Please Advise.

## 2021-06-02 ENCOUNTER — Ambulatory Visit: Payer: Medicare HMO | Admitting: Internal Medicine

## 2021-06-09 DIAGNOSIS — H401131 Primary open-angle glaucoma, bilateral, mild stage: Secondary | ICD-10-CM | POA: Diagnosis not present

## 2021-07-14 ENCOUNTER — Encounter: Payer: Self-pay | Admitting: Family Medicine

## 2021-08-15 ENCOUNTER — Encounter: Payer: Medicare HMO | Admitting: Family

## 2021-08-17 ENCOUNTER — Other Ambulatory Visit: Payer: Self-pay

## 2021-08-17 ENCOUNTER — Telehealth: Payer: Self-pay

## 2021-08-17 ENCOUNTER — Ambulatory Visit (INDEPENDENT_AMBULATORY_CARE_PROVIDER_SITE_OTHER): Payer: Medicare HMO | Admitting: Family

## 2021-08-17 ENCOUNTER — Encounter: Payer: Self-pay | Admitting: Family

## 2021-08-17 DIAGNOSIS — Z0000 Encounter for general adult medical examination without abnormal findings: Secondary | ICD-10-CM | POA: Diagnosis not present

## 2021-08-17 NOTE — Patient Instructions (Signed)
Jacqueline Hunt , Thank you for taking time to come for your Medicare Wellness Visit. I appreciate your ongoing commitment to your health goals. Please review the following plan we discussed and let me know if I can assist you in the future.   Screening recommendations/referrals: Colonoscopy N/A Mammogram N/A Bone Density Up to date  Recommended yearly ophthalmology/optometry visit for glaucoma screening and checkup Recommended yearly dental visit for hygiene and checkup  Vaccinations: Influenza vaccine Up to date  Pneumococcal vaccine Up to date  Tdap vaccine  Up to date  Shingles vaccine: Up to date     Advanced directives: Yes   Conditions/risks identified: Advance age female > 55 yrs,Hypertension,Hx of smoking  Next appointment: 1 year    Preventive Care 34 Years and Older, Female Preventive care refers to lifestyle choices and visits with your health care provider that can promote health and wellness. What does preventive care include? A yearly physical exam. This is also called an annual well check. Dental exams once or twice a year. Routine eye exams. Ask your health care provider how often you should have your eyes checked. Personal lifestyle choices, including: Daily care of your teeth and gums. Regular physical activity. Eating a healthy diet. Avoiding tobacco and drug use. Limiting alcohol use. Practicing safe sex. Taking low-dose aspirin every day. Taking vitamin and mineral supplements as recommended by your health care provider. What happens during an annual well check? The services and screenings done by your health care provider during your annual well check will depend on your age, overall health, lifestyle risk factors, and family history of disease. Counseling  Your health care provider may ask you questions about your: Alcohol use. Tobacco use. Drug use. Emotional well-being. Home and relationship well-being. Sexual activity. Eating habits. History of  falls. Memory and ability to understand (cognition). Work and work Statistician. Reproductive health. Screening  You may have the following tests or measurements: Height, weight, and BMI. Blood pressure. Lipid and cholesterol levels. These may be checked every 5 years, or more frequently if you are over 53 years old. Skin check. Lung cancer screening. You may have this screening every year starting at age 24 if you have a 30-pack-year history of smoking and currently smoke or have quit within the past 15 years. Fecal occult blood test (FOBT) of the stool. You may have this test every year starting at age 53. Flexible sigmoidoscopy or colonoscopy. You may have a sigmoidoscopy every 5 years or a colonoscopy every 10 years starting at age 70. Hepatitis C blood test. Hepatitis B blood test. Sexually transmitted disease (STD) testing. Diabetes screening. This is done by checking your blood sugar (glucose) after you have not eaten for a while (fasting). You may have this done every 1-3 years. Bone density scan. This is done to screen for osteoporosis. You may have this done starting at age 12. Mammogram. This may be done every 1-2 years. Talk to your health care provider about how often you should have regular mammograms. Talk with your health care provider about your test results, treatment options, and if necessary, the need for more tests. Vaccines  Your health care provider may recommend certain vaccines, such as: Influenza vaccine. This is recommended every year. Tetanus, diphtheria, and acellular pertussis (Tdap, Td) vaccine. You may need a Td booster every 10 years. Zoster vaccine. You may need this after age 69. Pneumococcal 13-valent conjugate (PCV13) vaccine. One dose is recommended after age 28. Pneumococcal polysaccharide (PPSV23) vaccine. One dose is recommended  after age 40. Talk to your health care provider about which screenings and vaccines you need and how often you need  them. This information is not intended to replace advice given to you by your health care provider. Make sure you discuss any questions you have with your health care provider. Document Released: 12/10/2015 Document Revised: 08/02/2016 Document Reviewed: 09/14/2015 Elsevier Interactive Patient Education  2017 [place] Prevention in the Home Falls can cause injuries. They can happen to people of all ages. There are many things you can do to make your home safe and to help prevent falls. What can I do on the outside of my home? Regularly fix the edges of walkways and driveways and fix any cracks. Remove anything that might make you trip as you walk through a door, such as a raised step or threshold. Trim any bushes or trees on the path to your home. Use bright outdoor lighting. Clear any walking paths of anything that might make someone trip, such as rocks or tools. Regularly check to see if handrails are loose or broken. Make sure that both sides of any steps have handrails. Any raised decks and porches should have guardrails on the edges. Have any leaves, snow, or ice cleared regularly. Use sand or salt on walking paths during winter. Clean up any spills in your garage right away. This includes oil or grease spills. What can I do in the bathroom? Use night lights. Install grab bars by the toilet and in the tub and shower. Do not use towel bars as grab bars. Use non-skid mats or decals in the tub or shower. If you need to sit down in the shower, use a plastic, non-slip stool. Keep the floor dry. Clean up any water that spills on the floor as soon as it happens. Remove soap buildup in the tub or shower regularly. Attach bath mats securely with double-sided non-slip rug tape. Do not have throw rugs and other things on the floor that can make you trip. What can I do in the bedroom? Use night lights. Make sure that you have a light by your bed that is easy to reach. Do not use  any sheets or blankets that are too big for your bed. They should not hang down onto the floor. Have a firm chair that has side arms. You can use this for support while you get dressed. Do not have throw rugs and other things on the floor that can make you trip. What can I do in the kitchen? Clean up any spills right away. Avoid walking on wet floors. Keep items that you use a lot in easy-to-reach places. If you need to reach something above you, use a strong step stool that has a grab bar. Keep electrical cords out of the way. Do not use floor polish or wax that makes floors slippery. If you must use wax, use non-skid floor wax. Do not have throw rugs and other things on the floor that can make you trip. What can I do with my stairs? Do not leave any items on the stairs. Make sure that there are handrails on both sides of the stairs and use them. Fix handrails that are broken or loose. Make sure that handrails are as long as the stairways. Check any carpeting to make sure that it is firmly attached to the stairs. Fix any carpet that is loose or worn. Avoid having throw rugs at the top or bottom of the stairs. If you  do have throw rugs, attach them to the floor with carpet tape. Make sure that you have a light switch at the top of the stairs and the bottom of the stairs. If you do not have them, ask someone to add them for you. What else can I do to help prevent falls? Wear shoes that: Do not have high heels. Have rubber bottoms. Are comfortable and fit you well. Are closed at the toe. Do not wear sandals. If you use a stepladder: Make sure that it is fully opened. Do not climb a closed stepladder. Make sure that both sides of the stepladder are locked into place. Ask someone to hold it for you, if possible. Clearly mark and make sure that you can see: Any grab bars or handrails. First and last steps. Where the edge of each step is. Use tools that help you move around (mobility aids)  if they are needed. These include: Canes. Walkers. Scooters. Crutches. Turn on the lights when you go into a dark area. Replace any light bulbs as soon as they burn out. Set up your furniture so you have a clear path. Avoid moving your furniture around. If any of your floors are uneven, fix them. If there are any pets around you, be aware of where they are. Review your medicines with your doctor. Some medicines can make you feel dizzy. This can increase your chance of falling. Ask your doctor what other things that you can do to help prevent falls. This information is not intended to replace advice given to you by your health care provider. Make sure you discuss any questions you have with your health care provider. Document Released: 09/09/2009 Document Revised: 04/20/2016 Document Reviewed: 12/18/2014 Elsevier Interactive Patient Education  2017 [employer].

## 2021-08-17 NOTE — Telephone Encounter (Signed)
Ms. taetum, flewellen are scheduled for a virtual visit with your provider today.    Just as we do with appointments in the office, we must obtain your consent to participate.  Your consent will be active for this visit and any virtual visit you may have with one of our providers in the next 365 days.    If you have a MyChart account, I can also send a copy of this consent to you electronically.  All virtual visits are billed to your insurance company just like a traditional visit in the office.  As this is a virtual visit, video technology does not allow for your provider to perform a traditional examination.  This may limit your provider's ability to fully assess your condition.  If your provider identifies any concerns that need to be evaluated in person or the need to arrange testing such as labs, EKG, etc, we will make arrangements to do so.    Although advances in technology are sophisticated, we cannot ensure that it will always work on either your end or our end.  If the connection with a video visit is poor, we may have to switch to a telephone visit.  With either a video or telephone visit, we are not always able to ensure that we have a secure connection.   I need to obtain your verbal consent now.   Are you willing to proceed with your visit today?   Carliyah Cotterman has provided verbal consent on 08/17/2021 for a virtual visit (video or telephone).   Carroll Kinds, [facility] 08/17/2021  1:59 PM

## 2021-08-17 NOTE — Progress Notes (Signed)
This service is provided via telemedicine  No vital signs collected/recorded due to the encounter was a telemedicine visit.   Location of patient (ex: home, work):  Home  Patient consents to a telephone visit:  Yes, see encounter dated 08/17/2021  Location of the provider (ex: office, home):  [facility]  Name of any referring provider:  Alain Honey, MD  Names of all persons participating in the telemedicine service and their role in the encounter:  Marlowe Sax, NP  Time spent on call:  15 minutes with medical assistant    Subjective:   Jacqueline Hunt is a 87 y.o. female who presents for Medicare Annual (Subsequent) preventive examination.  Review of Systems     Cardiac Risk Factors include: advanced age (>24men, >46 women);hypertension;smoking/ tobacco exposure;dyslipidemia     Objective:    There were no vitals filed for this visit. There is no height or weight on file to calculate BMI.  Advanced Directives 08/17/2021 12/02/2020 09/16/2020 08/30/2020  Does Patient Have a Medical Advance Directive? Yes Yes Yes Yes  Type of Paramedic of [place];Living will [place];Out of facility DNR (pink MOST or yellow form) Out of facility DNR (pink MOST or yellow form);Healthcare Power of [employer] of facility DNR (pink MOST or yellow form);Healthcare Power of Attorney  Does patient want to make changes to medical advance directive? No - Patient declined No - Patient declined No - Patient declined No - Patient declined  Copy of [place] in Chart? No - copy requested Yes - validated most recent copy scanned in chart (See row information) - No - copy requested    Current Medications (verified) Outpatient Encounter Medications as of 08/17/2021  Medication Sig   aspirin EC 81 MG tablet Take 81 mg by mouth daily.   Cholecalciferol (VITAMIN D3 PO) Take 500 mg by mouth daily.   Doxylamine  Succinate, Sleep, (UNISOM PO) Take by mouth.   estradiol (CLIMARA - DOSED IN MG/24 HR) 0.05 mg/24hr patch Place 1 patch (0.05 mg total) onto the skin once a week.   latanoprost (XALATAN) 0.005 % ophthalmic solution Place 1 drop into both eyes at bedtime.   lisinopril (ZESTRIL) 10 MG tablet Take 10 mg by mouth 2 (two) times daily.   Multiple Vitamins-Minerals (DAILY MULTIVITAMIN PO) Take 1 tablet by mouth daily.   rosuvastatin (CRESTOR) 5 MG tablet Take 5 mg by mouth every evening.   vitamin B-12 (CYANOCOBALAMIN) 500 MCG tablet Take 500 mcg by mouth daily.   [DISCONTINUED] benzonatate (TESSALON) 100 MG capsule Take 1 capsule (100 mg total) by mouth 2 (two) times daily as needed for cough.   No facility-administered encounter medications on file as of 08/17/2021.    Allergies (verified) Patient has no known allergies.   History: Past Medical History:  Diagnosis Date   Arthritis    Brain tumor ([place])    High blood pressure    Lung cancer ([place])    Osteopenia    Osteoporosis    Parathyroid adenoma    Past Surgical History:  Procedure Laterality Date   brain meningioma excesion     CATARACT EXTRACTION     CESAREAN SECTION     GALLBLADDER SURGERY     gallbladder surgery      LOBECTOMY     TONSILLECTOMY     TONSILLECTOMY     WRIST FRACTURE SURGERY     History reviewed. No pertinent family history. Social History   Socioeconomic  History   Marital status: Divorced    Spouse name: Not on file   Number of children: Not on file   Years of education: Not on file   Highest education level: Not on file  Occupational History   Not on file  Tobacco Use   Smoking status: Former    Packs/day: 1.00    Years: 57.10    Pack years: 57.10    Types: Cigarettes    Quit date: 1964    Years since quitting: 58.7   Smokeless tobacco: Never  Vaping Use   Vaping Use: Never used  Substance and Sexual Activity   Alcohol use: Never   Drug use: Never   Sexual activity: Not Currently  Other  Topics Concern   Not on file  Social History Narrative   Diet: Nothing Special      Do you drink/ eat things with caffeine?  Coffe      Marital status:   Divorced                            What year were you married ? 1951      Do you live in a house, apartment,assistred living, condo, trailer, etc.)? Apartment      Is it one or more stories? First Floor      How many persons live in your home ? One      Do you have any pets in your home ?(please list) Cat      Highest Level of education completed: PhD      Current or past profession:  Former Scientist, physiological + Professor , Social Work      Do you exercise?      Walk                        Type & how often  Just Power Walk Daily      ADVANCED DIRECTIVES (Please bring copies)      Do you have a living will? Yes      Do you have a DNR form?    Yes                   If not, do you want to discuss one?       Do you have signed POA?HPOA forms?  Yes               If so, please bring to your appointment      FUNCTIONAL STATUS- To be completed by Spouse / child / Staff       Do you have difficulty bathing or dressing yourself ? No      Do you have difficulty preparing food or eating ? No      Do you have difficulty managing your mediation ? No      Do you have difficulty managing your finances ? No      Do you have difficulty affording your medication ? No      Social Determinants of Radio broadcast assistant Strain: Not on file  Food Insecurity: Not on file  Transportation Needs: Not on file  Physical Activity: Not on file  Stress: Not on file  Social Connections: Not on file    Tobacco Counseling Counseling given: Not Answered   Clinical Intake:  Pre-visit preparation completed: No  Pain : No/denies pain     BMI - recorded: 21.05 Nutritional Status:  BMI of 19-24  Normal Nutritional Risks: None Diabetes: No  How often do you need to have someone help you when you read instructions, pamphlets, or other written  materials from your doctor or pharmacy?: 1 - Never What is the last grade level you completed in school?: PHd  Diabetic?No   Interpreter Needed?: No  Information entered by :: Jahzeel Poythress,FNP-C   Activities of Daily Living In your present state of health, do you have any difficulty performing the following activities: 08/17/2021  Hearing? Y  Comment wears hearing aids  Vision? N  Difficulty concentrating or making decisions? Y  Comment remembering  Walking or climbing stairs? N  Dressing or bathing? N  Doing errands, shopping? Y  Comment Copywriter, advertising and eating ? N  Using the Toilet? Y  Comment constipation  In the past six months, have you accidently leaked urine? N  Do you have problems with loss of bowel control? N  Managing your Medications? N  Managing your Finances? N  Housekeeping or managing your Housekeeping? Y  Comment Has Chartered certified accountant  Some recent data might be hidden    Patient Care Team: [name], MD as PCP - General (Family Medicine) Harlene Ramus (Podiatry)  Indicate any recent Medical Services you may have received from other than [facility] providers in the past year (date may be approximate).     Assessment:   This is a routine wellness examination for Dayja.  Hearing/Vision screen Hearing Screening - Comments:: Patient wears hearing aids Vision Screening - Comments:: Patient wears glasses. Patient had eye exam within last 6 months. Patient sees Dr. Gershon Crane  Dietary issues and exercise activities discussed: Current Exercise Habits: Home exercise routine, Type of exercise: stretching, Time (Minutes): 30, Frequency (Times/Week): 7, Weekly Exercise (Minutes/Week): 210, Intensity: Mild, Exercise limited by: None identified   Goals Addressed             This Visit's Progress    Patient Stated       I would like to do exercises daily to keep her gait steady        Depression Screen PHQ 2/9 Scores 08/17/2021 12/02/2020  08/30/2020  PHQ - 2 Score 0 0 0    Fall Risk Fall Risk  08/17/2021 12/02/2020 08/30/2020  Falls in the past year? 0 0 1  Number falls in past yr: 0 0 1  Injury with Fall? 0 0 1  Comment - - smashed wrist, weakness , injured face it was black and blue  Risk for fall due to : No Fall Risks - -  Follow up Falls evaluation completed - -    FALL RISK PREVENTION PERTAINING TO THE HOME:  Any stairs in or around the home? No  If so, are there any without handrails? No  Home free of loose throw rugs in walkways, pet beds, electrical cords, etc? No  Adequate lighting in your home to reduce risk of falls? Yes   ASSISTIVE DEVICES UTILIZED TO PREVENT FALLS:  Life alert? No  Use of a cane, walker or w/c? No  Grab bars in the bathroom? Yes  Shower chair or bench in shower? Yes  Elevated toilet seat or a handicapped toilet? No   TIMED UP AND GO:  Was the test performed? No .  Length of time to ambulate 10 feet: N/A sec.   Gait slow and steady without use of assistive device  Cognitive Function:     6CIT Screen 08/17/2021  What Year? 0  points  What month? 0 points  What time? 0 points  Count back from 20 0 points  Months in reverse 0 points  Repeat phrase 0 points  Total Score 0    Immunizations Immunization History  Administered Date(s) Administered   Hepatitis A, Adult 01/09/2018, 07/09/2018   Influenza Split 09/07/2011, 10/29/2012, 08/26/2014   Influenza, High Dose Seasonal PF 08/26/2014, 09/24/2017   Influenza-Unspecified 09/21/2009, 10/28/2019, 08/31/2020   PFIZER Comirnaty(Gray Top)Covid-19 Tri-Sucrose Vaccine 03/08/2021   PFIZER(Purple Top)SARS-COV-2 Vaccination 12/16/2019, 01/05/2020, 08/17/2020, 03/08/2021   Pneumococcal Conjugate-13 02/25/2014   Pneumococcal Polysaccharide-23 01/15/2007   Td,absorbed, Preservative Free, Adult Use, Lf Unspecified 11/29/2017   Tdap 11/29/2017   Zoster Recombinat (Shingrix) 03/26/2017, 06/24/2017   Zoster, Live 11/06/2011    TDAP  status: Up to date  Flu Vaccine status: Due, Education has been provided regarding the importance of this vaccine. Advised may receive this vaccine at local pharmacy or Health Dept. Aware to provide a copy of the vaccination record if obtained from local pharmacy or Health Dept. Verbalized acceptance and understanding.  Pneumococcal vaccine status: Up to date  Covid-19 vaccine status: Completed vaccines  Qualifies for Shingles Vaccine? Yes   Zostavax completed Yes   Shingrix Completed?: Yes  Screening Tests Health Maintenance  Topic Date Due   DEXA SCAN  Never done   INFLUENZA VACCINE  06/27/2021   COVID-19 Vaccine (5 - Booster for Pfizer series) 07/08/2021   TETANUS/TDAP  11/30/2027   Zoster Vaccines- Shingrix  Completed   HPV VACCINES  Aged Out    Health Maintenance  Health Maintenance Due  Topic Date Due   DEXA SCAN  Never done   INFLUENZA VACCINE  06/27/2021   COVID-19 Vaccine (5 - Booster for Pfizer series) 07/08/2021    Colorectal cancer screening: No longer required.   Mammogram status: No longer required due to Advance Age .  Bone Density status: Completed 04/2021. Results reflect: Bone density results: OSTEOPOROSIS. Repeat every 1 years.  Lung Cancer Screening: (Low Dose CT Chest recommended if Age 49-80 years, 30 pack-year currently smoking OR have quit w/in 15years.) does not qualify.   Lung Cancer Screening Referral: No   Additional Screening:  Hepatitis C Screening: does not qualify; Completed Yes   Vision Screening: Recommended annual ophthalmology exams for early detection of glaucoma and other disorders of the eye. Is the patient up to date with their annual eye exam?  Yes  Who is the provider or what is the name of the office in which the patient attends annual eye exams? Dr.Shapiro  If pt is not established with a provider, would they like to be referred to a provider to establish care? No .   Dental Screening: Recommended annual dental exams for  proper oral hygiene  Community Resource Referral / Chronic Care Management: CRR required this visit?  No   CCM required this visit?  No      Plan:     I have personally reviewed and noted the following in the patient's chart:   Medical and social history Use of alcohol, tobacco or illicit drugs  Current medications and supplements including opioid prescriptions.  Functional ability and status Nutritional status Physical activity Advanced directives List of other physicians Hospitalizations, surgeries, and ER visits in previous 12 months Vitals Screenings to include cognitive, depression, and falls Referrals and appointments  In addition, I have reviewed and discussed with patient certain preventive protocols, quality metrics, and best practice recommendations. A written personalized care plan for preventive services as well  as general preventive health recommendations were provided to patient.     [name], NP   08/17/2021   Nurse Notes:Has completed COVID-19 vaccine will bring immunization card to office

## 2021-09-06 ENCOUNTER — Telehealth: Payer: Self-pay | Admitting: Medical Oncology

## 2021-09-06 NOTE — Telephone Encounter (Addendum)
"  Heavy wheezing, sounds like snoring esp HS. Her cough is increased. Requests appt with Dr Julien Nordmann.  I told her she is under observation with Julien Nordmann so she needs to call her PCP  re her concerns.  Today she is travelling back to [place] from [place].  Message sent to PCP.

## 2021-09-12 DIAGNOSIS — H353131 Nonexudative age-related macular degeneration, bilateral, early dry stage: Secondary | ICD-10-CM | POA: Diagnosis not present

## 2021-09-12 DIAGNOSIS — H532 Diplopia: Secondary | ICD-10-CM | POA: Diagnosis not present

## 2021-09-12 DIAGNOSIS — H401131 Primary open-angle glaucoma, bilateral, mild stage: Secondary | ICD-10-CM | POA: Diagnosis not present

## 2021-09-12 DIAGNOSIS — H52203 Unspecified astigmatism, bilateral: Secondary | ICD-10-CM | POA: Diagnosis not present

## 2021-09-12 DIAGNOSIS — Z961 Presence of intraocular lens: Secondary | ICD-10-CM | POA: Diagnosis not present

## 2021-09-12 DIAGNOSIS — H524 Presbyopia: Secondary | ICD-10-CM | POA: Diagnosis not present

## 2021-09-13 ENCOUNTER — Ambulatory Visit (INDEPENDENT_AMBULATORY_CARE_PROVIDER_SITE_OTHER): Payer: Medicare HMO | Admitting: Family Medicine

## 2021-09-13 ENCOUNTER — Encounter: Payer: Self-pay | Admitting: Family Medicine

## 2021-09-13 ENCOUNTER — Other Ambulatory Visit: Payer: Self-pay

## 2021-09-13 VITALS — BP 136/88 | HR 84 | Temp 97.3°F | Ht 61.0 in | Wt 115.4 lb

## 2021-09-13 DIAGNOSIS — M25531 Pain in right wrist: Secondary | ICD-10-CM

## 2021-09-13 DIAGNOSIS — M159 Polyosteoarthritis, unspecified: Secondary | ICD-10-CM

## 2021-09-13 DIAGNOSIS — E789 Disorder of lipoprotein metabolism, unspecified: Secondary | ICD-10-CM

## 2021-09-13 DIAGNOSIS — I10 Essential (primary) hypertension: Secondary | ICD-10-CM

## 2021-09-13 NOTE — Progress Notes (Signed)
Provider:  Alain Honey, MD  Careteam: Patient Care Team: [name], MD as PCP - General (Family Medicine) Harlene Ramus (Podiatry)  PLACE OF SERVICE:  [place] Directive information    No Known Allergies  Chief Complaint  Patient presents with   Medical Management of Chronic Issues    Patient presents today for a 4 month follow-up.   Quality Metric Gaps    DEXA scan, COVID booster     HPI: Patient is a 87 y.o. female.  This is a routine follow-up.  She has been doing well but did have 1 episode where she was coughing and wheezing while she was visiting friends in [place].  Cough was nonproductive and she did not feel bad or short of breath.  Symptoms have since resolved. Note that she is taking statin and she is due for annual follow-up blood work. She denies any recent falls.  She is followed by oncology and has upcoming appointment next month.  Review of Systems:  Review of Systems  Constitutional: Negative.   Respiratory:  Positive for wheezing.   Cardiovascular: Negative.   Musculoskeletal: Negative.   Neurological: Negative.   Psychiatric/Behavioral: Negative.    All other systems reviewed and are negative.  Past Medical History:  Diagnosis Date   Arthritis    Brain tumor ([place])    High blood pressure    Lung cancer ([place])    Osteopenia    Osteoporosis    Parathyroid adenoma    Past Surgical History:  Procedure Laterality Date   brain meningioma excesion     CATARACT EXTRACTION     CESAREAN SECTION     GALLBLADDER SURGERY     gallbladder surgery      LOBECTOMY     TONSILLECTOMY     TONSILLECTOMY     WRIST FRACTURE SURGERY     Social History:   reports that she quit smoking about 58 years ago. Her smoking use included cigarettes. She has a 57.10 pack-year smoking history. She has never used smokeless tobacco. She reports that she does not drink alcohol and does not use drugs.  History reviewed. No pertinent family  history.  Medications: Patient's Medications  New Prescriptions   No medications on file  Previous Medications   ASPIRIN EC 81 MG TABLET    Take 81 mg by mouth daily.   CHOLECALCIFEROL (VITAMIN D3 PO)    Take 500 mg by mouth daily.   DOXYLAMINE SUCCINATE, SLEEP, (UNISOM PO)    Take by mouth.   ESTRADIOL (CLIMARA - DOSED IN MG/24 HR) 0.05 MG/24HR PATCH    Place 1 patch (0.05 mg total) onto the skin once a week.   LATANOPROST (XALATAN) 0.005 % OPHTHALMIC SOLUTION    Place 1 drop into both eyes at bedtime.   LISINOPRIL (ZESTRIL) 10 MG TABLET    Take 10 mg by mouth 2 (two) times daily.   MULTIPLE VITAMINS-MINERALS (DAILY MULTIVITAMIN PO)    Take 1 tablet by mouth daily.   ROSUVASTATIN (CRESTOR) 5 MG TABLET    Take 5 mg by mouth every evening.   VITAMIN B-12 (CYANOCOBALAMIN) 500 MCG TABLET    Take 500 mcg by mouth daily.  Modified Medications   No medications on file  Discontinued Medications   No medications on file    Physical Exam:  Vitals:   09/13/21 1027  BP: 136/88  Pulse: 84  Temp: (!) 97.3 F (36.3 C)  SpO2: 97%  Weight: 115 lb  6.4 oz (52.3 kg)  Height: 5\' 1"  (1.549 m)   Body mass index is 21.8 kg/m. Wt Readings from Last 3 Encounters:  09/13/21 115 lb 6.4 oz (52.3 kg)  05/18/21 111 lb 6.4 oz (50.5 kg)  04/19/21 114 lb (51.7 kg)    Physical Exam Vitals and nursing note reviewed.  Constitutional:      Appearance: Normal appearance.  HENT:     Mouth/Throat:     Mouth: Mucous membranes are moist.     Pharynx: Oropharynx is clear.  Cardiovascular:     Rate and Rhythm: Normal rate and regular rhythm.  Pulmonary:     Effort: Pulmonary effort is normal.     Breath sounds: Normal breath sounds. No wheezing.  Neurological:     General: No focal deficit present.     Mental Status: She is alert and oriented to person, place, and time.    Labs reviewed: Basic Metabolic Panel: Recent Labs    09/21/20 1349 04/15/21 1051  NA 140 140  K 3.9 4.3  CL 104 105  CO2  28 27  GLUCOSE 98 89  BUN 19 19  CREATININE 0.76 0.80  CALCIUM 9.5 9.5   Liver Function Tests: Recent Labs    09/21/20 1349 04/15/21 1051  AST 18 19  ALT 15 15  ALKPHOS 59 60  BILITOT 0.5 0.6  PROT 6.9 7.1  ALBUMIN 4.1 4.1   No results for input(s): LIPASE, AMYLASE in the last 8760 hours. No results for input(s): AMMONIA in the last 8760 hours. CBC: Recent Labs    09/21/20 1349 04/15/21 1051  WBC 4.2 4.3  NEUTROABS 2.4 2.6  HGB 12.7 13.1  HCT 39.4 38.6  MCV 92.5 90.0  PLT 150 153   Lipid Panel: No results for input(s): CHOL, HDL, LDLCALC, TRIG, CHOLHDL, LDLDIRECT in the last 8760 hours. TSH: No results for input(s): TSH in the last 8760 hours. A1C: No results found for: HGBA1C   Assessment/Plan  1. Lipid disorder Need to reassess lipid levels today - Lipid Panel  2. Acute pain of right wrist Symptoms resolved with splint  3. Primary osteoarthritis involving multiple joints Problem is stable.  Patient on no chronic medication for pain  4. Hypertension, unspecified type Blood pressure is good today 136/88 on lisinopril 10 mg   Alain Honey, MD [facility] [phone]

## 2021-09-14 LAB — LIPID PANEL
Cholesterol: 140 mg/dL (ref <200)
HDL: 69 mg/dL (ref >=50)
LDL Cholesterol (Calc): 57 mg/dL (calc)
Non-HDL Cholesterol (Calc): 71 mg/dL (calc) (ref <130)
Total CHOL/HDL Ratio: 2 (calc) (ref <5.0)
Triglycerides: 68 mg/dL (ref <150)

## 2021-10-17 ENCOUNTER — Other Ambulatory Visit: Payer: Self-pay

## 2021-10-17 ENCOUNTER — Ambulatory Visit (HOSPITAL_COMMUNITY)
Admission: RE | Admit: 2021-10-17 | Discharge: 2021-10-17 | Disposition: A | Discharge status: Home / Self Care | Payer: Medicare HMO | Source: Ambulatory Visit | Attending: Internal Medicine | Admitting: Internal Medicine

## 2021-10-17 ENCOUNTER — Inpatient Hospital Stay: Payer: Medicare HMO | Attending: Internal Medicine

## 2021-10-17 DIAGNOSIS — I700 Atherosclerosis of aorta: Secondary | ICD-10-CM | POA: Insufficient documentation

## 2021-10-17 DIAGNOSIS — C3490 Malignant neoplasm of unspecified part of unspecified bronchus or lung: Secondary | ICD-10-CM | POA: Insufficient documentation

## 2021-10-17 DIAGNOSIS — R911 Solitary pulmonary nodule: Secondary | ICD-10-CM | POA: Diagnosis not present

## 2021-10-17 DIAGNOSIS — M810 Age-related osteoporosis without current pathological fracture: Secondary | ICD-10-CM | POA: Insufficient documentation

## 2021-10-17 DIAGNOSIS — C787 Secondary malignant neoplasm of liver and intrahepatic bile duct: Secondary | ICD-10-CM | POA: Insufficient documentation

## 2021-10-17 DIAGNOSIS — Z902 Acquired absence of lung [part of]: Secondary | ICD-10-CM | POA: Insufficient documentation

## 2021-10-17 DIAGNOSIS — K769 Liver disease, unspecified: Secondary | ICD-10-CM | POA: Insufficient documentation

## 2021-10-17 DIAGNOSIS — C3411 Malignant neoplasm of upper lobe, right bronchus or lung: Secondary | ICD-10-CM | POA: Insufficient documentation

## 2021-10-17 DIAGNOSIS — Z79899 Other long term (current) drug therapy: Secondary | ICD-10-CM | POA: Insufficient documentation

## 2021-10-17 LAB — CMP (CANCER CENTER ONLY)
ALT: 16 U/L (ref 0–44)
AST: 19 U/L (ref 15–41)
Albumin: 4.1 g/dL (ref 3.5–5.0)
Alkaline Phosphatase: 69 U/L (ref 38–126)
Anion gap: 8 (ref 5–15)
BUN: 15 mg/dL (ref 8–23)
CO2: 28 mmol/L (ref 22–32)
Calcium: 9 mg/dL (ref 8.9–10.3)
Chloride: 106 mmol/L (ref 98–111)
Creatinine: 0.78 mg/dL (ref 0.44–1.00)
GFR, Estimated: >60 mL/min (ref >60)
Glucose, Bld: 83 mg/dL (ref 70–99)
Potassium: 3.7 mmol/L (ref 3.5–5.1)
Sodium: 142 mmol/L (ref 135–145)
Total Bilirubin: 0.4 mg/dL (ref 0.3–1.2)
Total Protein: 7 g/dL (ref 6.5–8.1)

## 2021-10-17 LAB — CBC WITH DIFFERENTIAL (CANCER CENTER ONLY)
Abs Immature Granulocytes: 0.01 10*3/uL (ref 0.00–0.07)
Basophils Absolute: 0 10*3/uL (ref 0.0–0.1)
Basophils Relative: 0 %
Eosinophils Absolute: 0.1 10*3/uL (ref 0.0–0.5)
Eosinophils Relative: 2 %
HCT: 40.2 % (ref 36.0–46.0)
Hemoglobin: 13.3 g/dL (ref 12.0–15.0)
Immature Granulocytes: 0 %
Lymphocytes Relative: 28 %
Lymphs Abs: 1.3 10*3/uL (ref 0.7–4.0)
MCH: 30.4 pg (ref 26.0–34.0)
MCHC: 33.1 g/dL (ref 30.0–36.0)
MCV: 91.8 fL (ref 80.0–100.0)
Monocytes Absolute: 0.3 10*3/uL (ref 0.1–1.0)
Monocytes Relative: 7 %
Neutro Abs: 2.8 10*3/uL (ref 1.7–7.7)
Neutrophils Relative %: 63 %
Platelet Count: 153 10*3/uL (ref 150–400)
RBC: 4.38 MIL/uL (ref 3.87–5.11)
RDW: 12.9 % (ref 11.5–15.5)
WBC Count: 4.6 10*3/uL (ref 4.0–10.5)
nRBC: 0 % (ref 0.0–0.2)

## 2021-10-17 MED ORDER — SODIUM CHLORIDE (PF) 0.9 % IJ SOLN
INTRAMUSCULAR | Status: AC
  Filled 2021-10-17: qty 50

## 2021-10-17 MED ORDER — IOHEXOL 350 MG/ML SOLN
60.0000 mL | Freq: Once | INTRAVENOUS | Status: AC | PRN
  Administered 2021-10-17: 60 mL via INTRAVENOUS

## 2021-10-19 ENCOUNTER — Other Ambulatory Visit: Payer: Self-pay

## 2021-10-19 ENCOUNTER — Inpatient Hospital Stay: Payer: Medicare HMO | Admitting: Internal Medicine

## 2021-10-19 VITALS — BP 154/92 | HR 83 | Temp 97.4°F | Resp 16 | Ht 61.0 in | Wt 115.3 lb

## 2021-10-19 DIAGNOSIS — M810 Age-related osteoporosis without current pathological fracture: Secondary | ICD-10-CM | POA: Diagnosis not present

## 2021-10-19 DIAGNOSIS — C3411 Malignant neoplasm of upper lobe, right bronchus or lung: Secondary | ICD-10-CM | POA: Diagnosis not present

## 2021-10-19 DIAGNOSIS — Z902 Acquired absence of lung [part of]: Secondary | ICD-10-CM | POA: Diagnosis not present

## 2021-10-19 DIAGNOSIS — C787 Secondary malignant neoplasm of liver and intrahepatic bile duct: Secondary | ICD-10-CM | POA: Diagnosis not present

## 2021-10-19 DIAGNOSIS — K769 Liver disease, unspecified: Secondary | ICD-10-CM | POA: Diagnosis not present

## 2021-10-19 DIAGNOSIS — C3490 Malignant neoplasm of unspecified part of unspecified bronchus or lung: Secondary | ICD-10-CM

## 2021-10-19 DIAGNOSIS — I700 Atherosclerosis of aorta: Secondary | ICD-10-CM | POA: Diagnosis not present

## 2021-10-19 DIAGNOSIS — Z79899 Other long term (current) drug therapy: Secondary | ICD-10-CM | POA: Diagnosis not present

## 2021-10-19 NOTE — Progress Notes (Signed)
[place] Telephone:(336) [phone]   Fax:(336) [phone]  OFFICE PROGRESS NOTE  [name], MD [place] [address]  DIAGNOSIS: stage Ib (T2 a, N0, M0) non-small cell lung cancer, adenocarcinoma predominantly acinar with lipidic features diagnosed in August 2020. Molecular studies showed negative for EGFR mutation and negative PD-L1 expression.   PRIOR THERAPY:  Status post right upper lobectomy with lymph node dissection in [place]).S   CURRENT THERAPY: Observation.  INTERVAL HISTORY: Jacqueline Hunt 87 y.o. female returns to the clinic today for 65-month follow-up visit.  The patient is feeling fine today with no concerning complaints.  She denied having any current chest pain, shortness of breath, cough or hemoptysis.  She has no nausea, vomiting, diarrhea or constipation.  She has no headache or visual changes.  She had repeat CT scan of the chest performed recently and she is here for evaluation and discussion of her risk her results.Marland Kitchen  MEDICAL HISTORY: Past Medical History:  Diagnosis Date   Arthritis    Brain tumor ([place])    High blood pressure    Lung cancer ([place])    Osteopenia    Osteoporosis    Parathyroid adenoma     ALLERGIES:  has No Known Allergies.  MEDICATIONS:  Current Outpatient Medications  Medication Sig Dispense Refill   aspirin EC 81 MG tablet Take 81 mg by mouth daily.     Cholecalciferol (VITAMIN D3 PO) Take 500 mg by mouth daily.     Doxylamine Succinate, Sleep, (UNISOM PO) Take by mouth.     estradiol (CLIMARA - DOSED IN MG/24 HR) 0.05 mg/24hr patch Place 1 patch (0.05 mg total) onto the skin once a week. 4 patch 4   latanoprost (XALATAN) 0.005 % ophthalmic solution Place 1 drop into both eyes at bedtime.     lisinopril (ZESTRIL) 10 MG tablet Take 10 mg by mouth 2 (two) times daily.     Multiple Vitamins-Minerals (DAILY MULTIVITAMIN PO) Take 1 tablet by mouth daily.     rosuvastatin (CRESTOR) 5 MG  tablet Take 5 mg by mouth every evening.     vitamin B-12 (CYANOCOBALAMIN) 500 MCG tablet Take 500 mcg by mouth daily.     No current facility-administered medications for this visit.    SURGICAL HISTORY:  Past Surgical History:  Procedure Laterality Date   brain meningioma excesion     CATARACT EXTRACTION     CESAREAN SECTION     GALLBLADDER SURGERY     gallbladder surgery      LOBECTOMY     TONSILLECTOMY     TONSILLECTOMY     WRIST FRACTURE SURGERY      REVIEW OF SYSTEMS:  A comprehensive review of systems was negative.   PHYSICAL EXAMINATION: General appearance: alert, cooperative, fatigued, and no distress Head: Normocephalic, without obvious abnormality, atraumatic Neck: no adenopathy, no JVD, supple, symmetrical, trachea midline, and thyroid not enlarged, symmetric, no tenderness/mass/nodules Lymph nodes: Cervical, supraclavicular, and axillary nodes normal. Resp: clear to auscultation bilaterally Back: symmetric, no curvature. ROM normal. No CVA tenderness. Cardio: regular rate and rhythm, S1, S2 normal, no murmur, click, rub or gallop GI: soft, non-tender; bowel sounds normal; no masses,  no organomegaly Extremities: extremities normal, atraumatic, no cyanosis or edema  ECOG PERFORMANCE STATUS: 1 - Symptomatic but completely ambulatory  Blood pressure (!) 154/92, pulse 83, temperature (!) 97.4 F (36.3 C), temperature source Oral, resp. rate 16, height $RemoveBe'5\' 1"'BKKwdmEYq$  (1.549 m), weight  115 lb 4.8 oz (52.3 kg), SpO2 98 %.  LABORATORY DATA: Lab Results  Component Value Date   WBC 4.6 10/17/2021   HGB 13.3 10/17/2021   HCT 40.2 10/17/2021   MCV 91.8 10/17/2021   PLT 153 10/17/2021      Chemistry      Component Value Date/Time   NA 142 10/17/2021 1206   K 3.7 10/17/2021 1206   CL 106 10/17/2021 1206   CO2 28 10/17/2021 1206   BUN 15 10/17/2021 1206   BUN 20 03/12/2020 0000   CREATININE 0.78 10/17/2021 1206   CREATININE 0.67 08/30/2020 1512   GLU 179 03/12/2020 0000       Component Value Date/Time   CALCIUM 9.0 10/17/2021 1206   ALKPHOS 69 10/17/2021 1206   AST 19 10/17/2021 1206   ALT 16 10/17/2021 1206   BILITOT 0.4 10/17/2021 1206       RADIOGRAPHIC STUDIES: CT Chest W Contrast  Result Date: 10/18/2021 CLINICAL DATA:  Restaging non-small cell lung cancer. EXAM: CT CHEST WITH CONTRAST TECHNIQUE: Multidetector CT imaging of the chest was performed during intravenous contrast administration. CONTRAST:  49mL OMNIPAQUE IOHEXOL 350 MG/ML SOLN COMPARISON:  04/15/2021 FINDINGS: Cardiovascular: Mild cardiac enlargement. Aortic atherosclerosis and coronary artery calcifications. Ascending thoracic aorta measures 4 cm on today's study, image 30/6. Previously this was measured at 4.2 cm. No pericardial effusion identified. Mediastinum/Nodes: No enlarged mediastinal, hilar, or axillary lymph nodes. Thyroid gland, trachea, and esophagus demonstrate no significant findings. Lungs/Pleura: No pleural effusion identified. Postoperative change from right upper lobectomy. There are a few small millimetric lung nodules which are unchanged from the previous exam from 10/17/2021. The largest is in the posterior left upper lobe measuring 4 mm, image 27/5. No new or enlarging lung nodules. Upper Abdomen: No acute abnormality. Subcapsular lesion within the posterior right lobe of liver measures 1.9 cm, unchanged from 10/13/2020, image 130/2. Adjacent low-density structure is difficult to confidently identify on the prior exams measuring 1.1 cm, image 130/2. Within the dome of liver there is a indeterminate peripherally enhancing low-density structure measuring 1.7 cm, image 34/6. Also not confidently noted on prior exams. Similar appearance of previously characterized thickening of the left adrenal gland, image 137/2 Musculoskeletal: No chest wall abnormality. No acute or significant osseous findings. IMPRESSION: 1. Stable CT of the chest status post right upper lobectomy. No specific  findings identified to suggest local tumor recurrence or metastatic disease. 2. There are 3 indeterminate low-density lesions within the liver which were not confidently noted on prior exams (differences may reflect differences in technique). In [place] there are reports from [facility] and [place] which described multiple liver lesions which were which were characterized by PET-CT as benign. If these studies can be retrieved I would be happy to review those studies and addend this exam reflecting any change or stability of these lesions. 3. Ascending thoracic aorta measures 4 cm on today's study, unchanged from 10/13/2020. Previously this was measured at 4.2 cm. Recommend annual imaging followup by CTA or MRA. This recommendation follows 2010 ACCF/AHA/AATS/ACR/ASA/SCA/SCAI/SIR/STS/SVM Guidelines for the Diagnosis and Management of Patients with Thoracic Aortic Disease. Circulation. 2010; 121: W409-B353. Aortic aneurysm NOS (ICD10-I71.9) 4. Aortic Atherosclerosis (ICD10-I70.0). Electronically Signed   By: Kerby Moors M.D.   On: 10/18/2021 14:07     ASSESSMENT AND PLAN: This is a very pleasant 87 years old white female diagnosed with stage Ib (T2 a, N0, M0) non-small cell lung cancer, adenocarcinoma predominantly acinar with lipidic features diagnosed  in August 2020 status post right upper lobectomy with lymph node dissection in [place]). Molecular studies showed negative for EGFR mutation and negative PD-L1 expression. The patient is currently on observation and she is feeling fine today with no concerning complaints. She had repeat CT scan of the chest performed recently.  I personally and independently reviewed the scan images and discussed the results with the patient today. Her scan showed no concerning findings for disease recurrence or progression in the chest but there was suspicious liver lesion that was seen on previous PET scan when she was in [place] and  they were not hypermetabolic but still concerning.  I recommended for the patient to have MRI of the liver for evaluation of this lesion and to rule out metastatic disease. If the MRI is negative for metastatic disease, I will consider seeing the patient back for follow-up visit in 6 months with repeat CT scan of the chest for restaging of her disease. She was advised to call immediately if she has any other concerning symptoms in the interval. The patient voices understanding of current disease status and treatment options and is in agreement with the current care plan.  All questions were answered. The patient knows to call the clinic with any problems, questions or concerns. We can certainly see the patient much sooner if necessary.  The total time spent in the appointment was 30 minutes.  Disclaimer: This note was dictated with voice recognition software. Similar sounding words can inadvertently be transcribed and may not be corrected upon review.

## 2021-10-28 ENCOUNTER — Ambulatory Visit (HOSPITAL_COMMUNITY)
Admission: RE | Admit: 2021-10-28 | Discharge: 2021-10-28 | Disposition: A | Discharge status: Home / Self Care | Payer: Medicare HMO | Source: Ambulatory Visit | Attending: Internal Medicine | Admitting: Internal Medicine

## 2021-10-28 ENCOUNTER — Other Ambulatory Visit: Payer: Self-pay | Admitting: Internal Medicine

## 2021-10-28 DIAGNOSIS — C787 Secondary malignant neoplasm of liver and intrahepatic bile duct: Secondary | ICD-10-CM | POA: Diagnosis present

## 2021-10-28 DIAGNOSIS — Z9049 Acquired absence of other specified parts of digestive tract: Secondary | ICD-10-CM | POA: Diagnosis not present

## 2021-10-28 DIAGNOSIS — K7689 Other specified diseases of liver: Secondary | ICD-10-CM | POA: Diagnosis not present

## 2021-10-28 DIAGNOSIS — I700 Atherosclerosis of aorta: Secondary | ICD-10-CM | POA: Diagnosis not present

## 2021-10-28 MED ORDER — GADOBUTROL 1 MMOL/ML IV SOLN
6.0000 mL | Freq: Once | INTRAVENOUS | Status: AC | PRN
  Administered 2021-10-28: 6 mL via INTRAVENOUS

## 2021-10-31 ENCOUNTER — Telehealth: Payer: Self-pay | Admitting: Internal Medicine

## 2021-10-31 NOTE — Telephone Encounter (Signed)
Scheduled per sch msg. Called and left msg  

## 2021-11-01 ENCOUNTER — Other Ambulatory Visit (HOSPITAL_COMMUNITY): Payer: Medicare HMO

## 2021-11-08 ENCOUNTER — Encounter (HOSPITAL_COMMUNITY): Payer: Self-pay | Admitting: Radiology

## 2021-11-08 ENCOUNTER — Other Ambulatory Visit: Payer: Self-pay

## 2021-11-08 ENCOUNTER — Inpatient Hospital Stay: Payer: Medicare HMO | Attending: Internal Medicine | Admitting: Internal Medicine

## 2021-11-08 ENCOUNTER — Encounter: Payer: Self-pay | Admitting: Internal Medicine

## 2021-11-08 VITALS — BP 151/91 | HR 88 | Temp 98.3°F | Resp 18 | Wt 115.3 lb

## 2021-11-08 DIAGNOSIS — C787 Secondary malignant neoplasm of liver and intrahepatic bile duct: Secondary | ICD-10-CM | POA: Insufficient documentation

## 2021-11-08 DIAGNOSIS — I700 Atherosclerosis of aorta: Secondary | ICD-10-CM | POA: Diagnosis not present

## 2021-11-08 DIAGNOSIS — I119 Hypertensive heart disease without heart failure: Secondary | ICD-10-CM | POA: Insufficient documentation

## 2021-11-08 DIAGNOSIS — Z79899 Other long term (current) drug therapy: Secondary | ICD-10-CM | POA: Diagnosis not present

## 2021-11-08 DIAGNOSIS — Z902 Acquired absence of lung [part of]: Secondary | ICD-10-CM | POA: Insufficient documentation

## 2021-11-08 DIAGNOSIS — Z9049 Acquired absence of other specified parts of digestive tract: Secondary | ICD-10-CM | POA: Diagnosis not present

## 2021-11-08 DIAGNOSIS — C3411 Malignant neoplasm of upper lobe, right bronchus or lung: Secondary | ICD-10-CM | POA: Diagnosis not present

## 2021-11-08 DIAGNOSIS — Q676 Pectus excavatum: Secondary | ICD-10-CM | POA: Diagnosis not present

## 2021-11-08 NOTE — Progress Notes (Signed)
[place] Telephone:(336) [phone]   Fax:(336) [phone]  OFFICE PROGRESS NOTE  [name], MD [place] [address]  DIAGNOSIS: stage Ib (T2 a, N0, M0) non-small cell lung cancer, adenocarcinoma predominantly acinar with lipidic features diagnosed in August 2020. Molecular studies showed negative for EGFR mutation and negative PD-L1 expression.   PRIOR THERAPY:  Status post right upper lobectomy with lymph node dissection in [place]).   CURRENT THERAPY: Observation.  INTERVAL HISTORY: Jacqueline Hunt 87 y.o. female returns to the clinic today for follow-up visit accompanied by her son.  The patient is feeling fine today with no concerning complaints.  She denied having any current chest pain, shortness of breath, cough or hemoptysis.  She denied having any fever or chills.  She has no nausea, vomiting, diarrhea or constipation.  She has no headache or visual changes.  She was found on previous CT scan of the chest to have suspicious lesion in the liver.  I ordered MRI of the liver that was performed recently and she is here for evaluation and discussion of her MRI results as well as treatment options.  MEDICAL HISTORY: Past Medical History:  Diagnosis Date   Arthritis    Brain tumor ([place])    High blood pressure    Lung cancer ([place])    Osteopenia    Osteoporosis    Parathyroid adenoma     ALLERGIES:  has No Known Allergies.  MEDICATIONS:  Current Outpatient Medications  Medication Sig Dispense Refill   aspirin EC 81 MG tablet Take 81 mg by mouth daily.     Cholecalciferol (VITAMIN D3 PO) Take 500 mg by mouth daily.     Doxylamine Succinate, Sleep, (UNISOM PO) Take by mouth.     estradiol (CLIMARA - DOSED IN MG/24 HR) 0.05 mg/24hr patch Place 1 patch (0.05 mg total) onto the skin once a week. 4 patch 4   latanoprost (XALATAN) 0.005 % ophthalmic solution Place 1 drop into both eyes at bedtime.     lisinopril (ZESTRIL) 10 MG tablet  Take 10 mg by mouth 2 (two) times daily.     Multiple Vitamins-Minerals (DAILY MULTIVITAMIN PO) Take 1 tablet by mouth daily.     rosuvastatin (CRESTOR) 5 MG tablet Take 5 mg by mouth every evening.     vitamin B-12 (CYANOCOBALAMIN) 500 MCG tablet Take 500 mcg by mouth daily.     No current facility-administered medications for this visit.    SURGICAL HISTORY:  Past Surgical History:  Procedure Laterality Date   brain meningioma excesion     CATARACT EXTRACTION     CESAREAN SECTION     GALLBLADDER SURGERY     gallbladder surgery      LOBECTOMY     TONSILLECTOMY     TONSILLECTOMY     WRIST FRACTURE SURGERY      REVIEW OF SYSTEMS:  A comprehensive review of systems was negative.   PHYSICAL EXAMINATION: General appearance: alert, cooperative, and no distress Head: Normocephalic, without obvious abnormality, atraumatic Neck: no adenopathy, no JVD, supple, symmetrical, trachea midline, and thyroid not enlarged, symmetric, no tenderness/mass/nodules Lymph nodes: Cervical, supraclavicular, and axillary nodes normal. Resp: clear to auscultation bilaterally Back: symmetric, no curvature. ROM normal. No CVA tenderness. Cardio: regular rate and rhythm, S1, S2 normal, no murmur, click, rub or gallop GI: soft, non-tender; bowel sounds normal; no masses,  no organomegaly Extremities: extremities normal, atraumatic, no cyanosis or edema  ECOG PERFORMANCE  STATUS: 1 - Symptomatic but completely ambulatory  Blood pressure (!) 151/91, pulse 88, temperature 98.3 F (36.8 C), temperature source Tympanic, resp. rate 18, weight 115 lb 4.8 oz (52.3 kg), SpO2 99 %.  LABORATORY DATA: Lab Results  Component Value Date   WBC 4.6 10/17/2021   HGB 13.3 10/17/2021   HCT 40.2 10/17/2021   MCV 91.8 10/17/2021   PLT 153 10/17/2021      Chemistry      Component Value Date/Time   NA 142 10/17/2021 1206   K 3.7 10/17/2021 1206   CL 106 10/17/2021 1206   CO2 28 10/17/2021 1206   BUN 15 10/17/2021  1206   BUN 20 03/12/2020 0000   CREATININE 0.78 10/17/2021 1206   CREATININE 0.67 08/30/2020 1512   GLU 179 03/12/2020 0000      Component Value Date/Time   CALCIUM 9.0 10/17/2021 1206   ALKPHOS 69 10/17/2021 1206   AST 19 10/17/2021 1206   ALT 16 10/17/2021 1206   BILITOT 0.4 10/17/2021 1206       RADIOGRAPHIC STUDIES: CT Chest W Contrast  Result Date: 10/18/2021 CLINICAL DATA:  Restaging non-small cell lung cancer. EXAM: CT CHEST WITH CONTRAST TECHNIQUE: Multidetector CT imaging of the chest was performed during intravenous contrast administration. CONTRAST:  53mL OMNIPAQUE IOHEXOL 350 MG/ML SOLN COMPARISON:  04/15/2021 FINDINGS: Cardiovascular: Mild cardiac enlargement. Aortic atherosclerosis and coronary artery calcifications. Ascending thoracic aorta measures 4 cm on today's study, image 30/6. Previously this was measured at 4.2 cm. No pericardial effusion identified. Mediastinum/Nodes: No enlarged mediastinal, hilar, or axillary lymph nodes. Thyroid gland, trachea, and esophagus demonstrate no significant findings. Lungs/Pleura: No pleural effusion identified. Postoperative change from right upper lobectomy. There are a few small millimetric lung nodules which are unchanged from the previous exam from 10/17/2021. The largest is in the posterior left upper lobe measuring 4 mm, image 27/5. No new or enlarging lung nodules. Upper Abdomen: No acute abnormality. Subcapsular lesion within the posterior right lobe of liver measures 1.9 cm, unchanged from 10/13/2020, image 130/2. Adjacent low-density structure is difficult to confidently identify on the prior exams measuring 1.1 cm, image 130/2. Within the dome of liver there is a indeterminate peripherally enhancing low-density structure measuring 1.7 cm, image 34/6. Also not confidently noted on prior exams. Similar appearance of previously characterized thickening of the left adrenal gland, image 137/2 Musculoskeletal: No chest wall abnormality.  No acute or significant osseous findings. IMPRESSION: 1. Stable CT of the chest status post right upper lobectomy. No specific findings identified to suggest local tumor recurrence or metastatic disease. 2. There are 3 indeterminate low-density lesions within the liver which were not confidently noted on prior exams (differences may reflect differences in technique). In [place] there are reports from [facility] and [place] which described multiple liver lesions which were which were characterized by PET-CT as benign. If these studies can be retrieved I would be happy to review those studies and addend this exam reflecting any change or stability of these lesions. 3. Ascending thoracic aorta measures 4 cm on today's study, unchanged from 10/13/2020. Previously this was measured at 4.2 cm. Recommend annual imaging followup by CTA or MRA. This recommendation follows 2010 ACCF/AHA/AATS/ACR/ASA/SCA/SCAI/SIR/STS/SVM Guidelines for the Diagnosis and Management of Patients with Thoracic Aortic Disease. Circulation. 2010; 121: X540-G867. Aortic aneurysm NOS (ICD10-I71.9) 4. Aortic Atherosclerosis (ICD10-I70.0). Electronically Signed   By: Kerby Moors M.D.   On: 10/18/2021 14:07   MR LIVER W WO CONTRAST  Result Date:  10/29/2021 CLINICAL DATA:  87 year old female with history of non-small cell lung cancer with suspected metastatic disease to the liver. EXAM: MRI ABDOMEN WITHOUT AND WITH CONTRAST TECHNIQUE: Multiplanar multisequence MR imaging of the abdomen was performed both before and after the administration of intravenous contrast. CONTRAST:  82mL GADAVIST GADOBUTROL 1 MMOL/ML IV SOLN COMPARISON:  No prior abdominal MRI.  CT of the chest 10/17/2021. FINDINGS: Comment: Portions of today's examination are limited by considerable patient respiratory motion. Lower chest: Pectus excavatum. Hepatobiliary: Respiratory motion considerably limits assessment of the liver, however, there clearly  multiple lesions which are generally slightly T1 hypointense, of variable T2 signal intensity but slightly T2 hyperintense, appear hypovascular on post gadolinium imaging, and demonstrate diffusion restriction, highly concerning for widespread metastatic disease. These lesions overall are best demonstrated on diffusion-weighted sequences, where the largest of these is in the posterior aspect of the right lobe of the liver between segments 6 and 7 (axial image 47 of series 6) measuring 2.4 x 2.0 cm. No intrahepatic biliary ductal dilatation. Status post cholecystectomy. Common bile duct is mildly dilated measuring 9 mm within the porta hepatis, within normal limits given the patient's advanced age and post cholecystectomy status. There are no filling defects within the common bile duct to suggest choledocholithiasis. Pancreas: No pancreatic mass. No pancreatic ductal dilatation. No pancreatic or peripancreatic fluid collections or inflammatory changes. Spleen:  Unremarkable. Adrenals/Urinary Tract: Exophytic 1.7 x 1.2 cm T1 hypointense, T2 hyperintense, nonenhancing lesion extending off the lower pole of the right kidney laterally, compatible with a small simple cyst. Left kidney and bilateral adrenal glands are normal in appearance. No hydroureteronephrosis in the visualized portions of the abdomen. Stomach/Bowel: Visualized portions are unremarkable. Vascular/Lymphatic: Aortic atherosclerosis, without evidence of aneurysm in the abdominal vasculature. No lymphadenopathy noted in the abdomen. Other: No significant volume of ascites noted in the visualized portions of the peritoneal cavity. Musculoskeletal: No aggressive appearing osseous lesions are noted in the visualized portions of the skeleton. IMPRESSION: 1. Multiple hepatic lesions which have imaging characteristics highly concerning for widespread metastatic disease to the liver, as detailed above. 2. Aortic atherosclerosis. 3. Additional incidental findings,  as above. Electronically Signed   By: Vinnie Langton M.D.   On: 10/29/2021 07:20   MR 3D Recon At Scanner  Result Date: 10/29/2021 CLINICAL DATA:  87 year old female with history of non-small cell lung cancer with suspected metastatic disease to the liver. EXAM: MRI ABDOMEN WITHOUT AND WITH CONTRAST TECHNIQUE: Multiplanar multisequence MR imaging of the abdomen was performed both before and after the administration of intravenous contrast. CONTRAST:  64mL GADAVIST GADOBUTROL 1 MMOL/ML IV SOLN COMPARISON:  No prior abdominal MRI.  CT of the chest 10/17/2021. FINDINGS: Comment: Portions of today's examination are limited by considerable patient respiratory motion. Lower chest: Pectus excavatum. Hepatobiliary: Respiratory motion considerably limits assessment of the liver, however, there clearly multiple lesions which are generally slightly T1 hypointense, of variable T2 signal intensity but slightly T2 hyperintense, appear hypovascular on post gadolinium imaging, and demonstrate diffusion restriction, highly concerning for widespread metastatic disease. These lesions overall are best demonstrated on diffusion-weighted sequences, where the largest of these is in the posterior aspect of the right lobe of the liver between segments 6 and 7 (axial image 47 of series 6) measuring 2.4 x 2.0 cm. No intrahepatic biliary ductal dilatation. Status post cholecystectomy. Common bile duct is mildly dilated measuring 9 mm within the porta hepatis, within normal limits given the patient's advanced age and post cholecystectomy status. There are no  filling defects within the common bile duct to suggest choledocholithiasis. Pancreas: No pancreatic mass. No pancreatic ductal dilatation. No pancreatic or peripancreatic fluid collections or inflammatory changes. Spleen:  Unremarkable. Adrenals/Urinary Tract: Exophytic 1.7 x 1.2 cm T1 hypointense, T2 hyperintense, nonenhancing lesion extending off the lower pole of the right kidney  laterally, compatible with a small simple cyst. Left kidney and bilateral adrenal glands are normal in appearance. No hydroureteronephrosis in the visualized portions of the abdomen. Stomach/Bowel: Visualized portions are unremarkable. Vascular/Lymphatic: Aortic atherosclerosis, without evidence of aneurysm in the abdominal vasculature. No lymphadenopathy noted in the abdomen. Other: No significant volume of ascites noted in the visualized portions of the peritoneal cavity. Musculoskeletal: No aggressive appearing osseous lesions are noted in the visualized portions of the skeleton. IMPRESSION: 1. Multiple hepatic lesions which have imaging characteristics highly concerning for widespread metastatic disease to the liver, as detailed above. 2. Aortic atherosclerosis. 3. Additional incidental findings, as above. Electronically Signed   By: Vinnie Langton M.D.   On: 10/29/2021 07:20     ASSESSMENT AND PLAN: This is a very pleasant 87 years old white female diagnosed with stage Ib (T2 a, N0, M0) non-small cell lung cancer, adenocarcinoma predominantly acinar with lipidic features diagnosed in August 2020 status post right upper lobectomy with lymph node dissection in [place]). Molecular studies showed negative for EGFR mutation and negative PD-L1 expression. The patient is currently on observation and she is feeling fine today with no concerning complaints. Her recent scan of the chest showed no concerning findings for disease recurrence or progression in the chest but there was suspicious liver lesion that was seen on previous PET scan when she was in [place] and they were not hypermetabolic but still concerning.  I recommended for the patient to have MRI of the liver for evaluation of this lesion and to rule out metastatic disease. I ordered MRI of the liver that was performed recently and unfortunately showed concerning findings for liver metastasis. I recommended for the patient to undergo  ultrasound-guided core biopsy of one of the suspicious lesion for confirmation of tissue diagnosis. I will see the patient back for follow-up visit in around 3 weeks for evaluation and discussion of her biopsy results as well as treatment options. For the hypertension she will continue with her current blood pressure medications for now. The patient was advised to call immediately if she has any other concerning symptoms in the interval. I personally and independently reviewed the images and discussed the result and showed the images to the patient and her son today. The patient voices understanding of current disease status and treatment options and is in agreement with the current care plan.  All questions were answered. The patient knows to call the clinic with any problems, questions or concerns. We can certainly see the patient much sooner if necessary.  The total time spent in the appointment was 34 minutes.  Disclaimer: This note was dictated with voice recognition software. Similar sounding words can inadvertently be transcribed and may not be corrected upon review.

## 2021-11-08 NOTE — Progress Notes (Signed)
Patient Name  Jacqueline Hunt, Jacqueline Hunt Legal Sex  Female DOB  May 17, 1928 SSN  [id number] Address  [place] [place] [address] Phone  [phone] [facility])  [phone] (Mobile)    RE: US BIOPSY (LIVER) Received: Today Suttle, Rosanne Ashing, MD  Garth Bigness D Approved for US guided liver mass biopsy.   Dylan        Previous Messages   ----- Message -----  From: Garth Bigness D  Sent: 11/08/2021   9:58 AM EST  To: Ir Procedure Requests  Subject: US BIOPSY (LIVER)                               Procedure:  US BIOPSY (LIVER)   Reason:  Malignant neoplasm of upper lobe of right lung, US guided Core biopsy of one of the liver lesions seen on MRI liver recently for tissue diagnosis   History:  CT, MR in computer   Provider:  Curt Bears   Provider Contact:  [phone]

## 2021-11-14 ENCOUNTER — Other Ambulatory Visit: Payer: Self-pay | Admitting: Radiology

## 2021-11-15 ENCOUNTER — Ambulatory Visit (HOSPITAL_COMMUNITY)
Admission: RE | Admit: 2021-11-15 | Discharge: 2021-11-15 | Disposition: A | Discharge status: Home / Self Care | Payer: Medicare HMO | Source: Ambulatory Visit | Attending: Cardiovascular Disease | Admitting: Cardiovascular Disease

## 2021-11-15 ENCOUNTER — Other Ambulatory Visit: Payer: Self-pay

## 2021-11-15 ENCOUNTER — Encounter (HOSPITAL_COMMUNITY): Payer: Self-pay

## 2021-11-15 ENCOUNTER — Ambulatory Visit (HOSPITAL_COMMUNITY)
Admission: RE | Admit: 2021-11-15 | Discharge: 2021-11-15 | Disposition: A | Discharge status: Home / Self Care | Payer: Medicare HMO | Source: Ambulatory Visit | Attending: Internal Medicine | Admitting: Internal Medicine

## 2021-11-15 DIAGNOSIS — K769 Liver disease, unspecified: Secondary | ICD-10-CM | POA: Diagnosis not present

## 2021-11-15 DIAGNOSIS — D3A8 Other benign neuroendocrine tumors: Secondary | ICD-10-CM | POA: Insufficient documentation

## 2021-11-15 DIAGNOSIS — C3411 Malignant neoplasm of upper lobe, right bronchus or lung: Secondary | ICD-10-CM | POA: Diagnosis not present

## 2021-11-15 DIAGNOSIS — R160 Hepatomegaly, not elsewhere classified: Secondary | ICD-10-CM | POA: Diagnosis not present

## 2021-11-15 HISTORY — DX: Dyspnea, unspecified: R06.00

## 2021-11-15 HISTORY — DX: Adverse effect of other opioids, initial encounter: K59.03

## 2021-11-15 HISTORY — DX: Adverse effect of other opioids, initial encounter: T40.2X5A

## 2021-11-15 HISTORY — DX: Drug induced constipation: K59.03

## 2021-11-15 LAB — PROTIME-INR
INR: 0.9 (ref 0.8–1.2)
Prothrombin Time: 12.4 seconds (ref 11.4–15.2)

## 2021-11-15 LAB — CBC WITH DIFFERENTIAL/PLATELET
Abs Immature Granulocytes: 0.01 10*3/uL (ref 0.00–0.07)
Basophils Absolute: 0 10*3/uL (ref 0.0–0.1)
Basophils Relative: 1 %
Eosinophils Absolute: 0.1 10*3/uL (ref 0.0–0.5)
Eosinophils Relative: 2 %
HCT: 41.7 % (ref 36.0–46.0)
Hemoglobin: 13.7 g/dL (ref 12.0–15.0)
Immature Granulocytes: 0 %
Lymphocytes Relative: 36 %
Lymphs Abs: 1.4 10*3/uL (ref 0.7–4.0)
MCH: 30.2 pg (ref 26.0–34.0)
MCHC: 32.9 g/dL (ref 30.0–36.0)
MCV: 92.1 fL (ref 80.0–100.0)
Monocytes Absolute: 0.3 10*3/uL (ref 0.1–1.0)
Monocytes Relative: 7 %
Neutro Abs: 2.1 10*3/uL (ref 1.7–7.7)
Neutrophils Relative %: 54 %
Platelets: 150 10*3/uL (ref 150–400)
RBC: 4.53 MIL/uL (ref 3.87–5.11)
RDW: 13.1 % (ref 11.5–15.5)
WBC: 3.8 10*3/uL — ABNORMAL LOW (ref 4.0–10.5)
nRBC: 0 % (ref 0.0–0.2)

## 2021-11-15 LAB — COMPREHENSIVE METABOLIC PANEL
ALT: 19 U/L (ref 0–44)
AST: 21 U/L (ref 15–41)
Albumin: 4.5 g/dL (ref 3.5–5.0)
Alkaline Phosphatase: 56 U/L (ref 38–126)
Anion gap: 7 (ref 5–15)
BUN: 22 mg/dL (ref 8–23)
CO2: 27 mmol/L (ref 22–32)
Calcium: 9.1 mg/dL (ref 8.9–10.3)
Chloride: 105 mmol/L (ref 98–111)
Creatinine, Ser: 0.8 mg/dL (ref 0.44–1.00)
GFR, Estimated: >60 mL/min (ref >60)
Glucose, Bld: 93 mg/dL (ref 70–99)
Potassium: 3.7 mmol/L (ref 3.5–5.1)
Sodium: 139 mmol/L (ref 135–145)
Total Bilirubin: 0.8 mg/dL (ref 0.3–1.2)
Total Protein: 7.5 g/dL (ref 6.5–8.1)

## 2021-11-15 MED ORDER — FENTANYL CITRATE (PF) 100 MCG/2ML IJ SOLN
INTRAMUSCULAR | Status: AC | PRN
  Administered 2021-11-15 (×2): 25 ug via INTRAVENOUS

## 2021-11-15 MED ORDER — FENTANYL CITRATE (PF) 100 MCG/2ML IJ SOLN
INTRAMUSCULAR | Status: AC
  Filled 2021-11-15: qty 2

## 2021-11-15 MED ORDER — SODIUM CHLORIDE 0.9 % IV SOLN: INTRAVENOUS | Status: DC

## 2021-11-15 MED ORDER — MIDAZOLAM HCL 2 MG/2ML IJ SOLN
INTRAMUSCULAR | Status: AC | PRN
  Administered 2021-11-15 (×2): .5 mg via INTRAVENOUS

## 2021-11-15 MED ORDER — GELATIN ABSORBABLE 12-7 MM EX MISC
CUTANEOUS | Status: AC
  Administered 2021-11-15: 14:00:00 1
  Filled 2021-11-15: qty 1

## 2021-11-15 MED ORDER — LIDOCAINE HCL 1 % IJ SOLN
INTRAMUSCULAR | Status: AC
  Administered 2021-11-15: 14:00:00 10 mL
  Filled 2021-11-15: qty 20

## 2021-11-15 MED ORDER — MIDAZOLAM HCL 2 MG/2ML IJ SOLN
INTRAMUSCULAR | Status: AC
  Filled 2021-11-15: qty 2

## 2021-11-15 NOTE — Procedures (Signed)
Interventional Radiology Procedure Note  Procedure: US guided core biopsy of liver lesion  Complications: None  Estimated Blood Loss: None  Recommendations: - Bedrest x 2 hrs - DC home   Signed,  Criselda Peaches, MD

## 2021-11-15 NOTE — Discharge Instructions (Addendum)
Interventional radiology phone numbers [phone] After hours [phone]  Liver Biopsy, Care After These instructions give you information on caring for yourself after your procedure. Your doctor may also give you more specific instructions. Call your doctor if you have any problems or questions after your procedure. What can I expect after the procedure? After the procedure, it is common to have: Pain and soreness where the biopsy was done. Bruising around the area where the biopsy was done. Sleepiness and be tired for a few days. Follow these instructions at home: Medicines Take over-the-counter and prescription medicines only as told by your doctor. If you were prescribed an antibiotic medicine, take it as told by your doctor. Do not stop taking the antibiotic even if you start to feel better. Do not take medicines such as aspirin and ibuprofen. These medicines can thin your blood. Do not take these medicines unless your doctor tells you to take them. If you are taking prescription pain medicine, take actions to prevent or treat constipation. Your doctor may recommend that you: Drink enough fluid to keep your pee (urine) clear or pale yellow. Take over-the-counter or prescription medicines. Eat foods that are high in fiber, such as fresh fruits and vegetables, whole grains, and beans. Limit foods that are high in fat and processed sugars, such as fried and sweet foods. Caring for your cut Follow instructions from your doctor about how to take care of your cuts from surgery (incisions). Make sure you: Wash your hands with soap and water before you change your bandage (dressing). If you cannot use soap and water, use hand sanitizer. Change your bandage as told by your doctor. Check your cuts every day for signs of infection. Check for: Redness, swelling, or more pain. Fluid or blood. Pus or a bad smell. Warmth. Do not take baths, swim, or use a hot tub until your doctor says it is  okay to do so. You may remove your dressing tomorrow and shower. Activity Rest at home for 1-2 days or as told by your doctor. Avoid sitting for a long time without moving. Get up to take short walks every 1-2 hours. Return to your normal activities as told by your doctor. Ask what activities are safe for you. Do not do these things in the first 24 hours: Drive. Use machinery. Take a bath or shower. Do not lift more than 10 pounds (4.5 kg) or play contact sports for the first 2 weeks.   General instructions Do not drink alcohol in the first week after the procedure. Have someone stay with you for at least 24 hours after the procedure. Get your test results. Ask your doctor or the department that is doing the test: When will my results be ready? How will I get my results? What are my treatment options? What other tests do I need? What are my next steps? Keep all follow-up visits as told by your doctor. This is important.   Contact a doctor if: A cut bleeds and leaves more than just a small spot of blood. A cut is red, puffs up (swells), or hurts more than before. Fluid or something else comes from a cut. A cut smells bad. You have a fever or chills. Get help right away if: You have swelling, bloating, or pain in your belly (abdomen). You get dizzy or faint. You have a rash. You feel sick to your stomach (nauseous) or throw up (vomit). You have trouble breathing, feel short of breath, or feel faint. Your chest   hurts. You have problems talking or seeing. You have trouble with your balance or moving your arms or legs. Summary After the procedure, it is common to have pain, soreness, bruising, and tiredness. Your doctor will tell you how to take care of yourself at home. Change your bandage, take your medicines, and limit your activities as told by your doctor. Call your doctor if you have symptoms of infection. Get help right away if your belly swells, your cut bleeds a lot, or you  have trouble talking or breathing. This information is not intended to replace advice given to you by your health care provider. Make sure you discuss any questions you have with your health care provider. Document Revised: 11/22/2017 Document Reviewed: 11/23/2017 Elsevier Patient Education  2021 Elsevier Inc.     Moderate Conscious Sedation, Adult, Care After This sheet gives you information about how to care for yourself after your procedure. Your health care provider may also give you more specific instructions. If you have problems or questions, contact your health care provider. What can I expect after the procedure? After the procedure, it is common to have: Sleepiness for several hours. Impaired judgment for several hours. Difficulty with balance. Vomiting if you eat too soon. Follow these instructions at home: For the time period you were told by your health care provider: Rest. Do not participate in activities where you could fall or become injured. Do not drive or use machinery. Do not drink alcohol. Do not take sleeping pills or medicines that cause drowsiness. Do not make important decisions or sign legal documents. Do not take care of children on your own.     Eating and drinking Follow the diet recommended by your health care provider. Drink enough fluid to keep your urine pale yellow. If you vomit: Drink water, juice, or soup when you can drink without vomiting. Make sure you have little or no nausea before eating solid foods.   General instructions Take over-the-counter and prescription medicines only as told by your health care provider. Have a responsible adult stay with you for the time you are told. It is important to have someone help care for you until you are awake and alert. Do not smoke. Keep all follow-up visits as told by your health care provider. This is important. Contact a health care provider if: You are still sleepy or having trouble with  balance after 24 hours. You feel light-headed. You keep feeling nauseous or you keep vomiting. You develop a rash. You have a fever. You have redness or swelling around the IV site. Get help right away if: You have trouble breathing. You have new-onset confusion at home. Summary After the procedure, it is common to feel sleepy, have impaired judgment, or feel nauseous if you eat too soon. Rest after you get home. Know the things you should not do after the procedure. Follow the diet recommended by your health care provider and drink enough fluid to keep your urine pale yellow. Get help right away if you have trouble breathing or new-onset confusion at home. This information is not intended to replace advice given to you by your health care provider. Make sure you discuss any questions you have with your health care provider. Document Revised: 03/12/2020 Document Reviewed: 10/09/2019 Elsevier Patient Education  2021 Elsevier Inc.  

## 2021-11-15 NOTE — H&P (Signed)
Chief Complaint: Liver lesions. Request is for liver lesion biopsy  Referring Physician(s): [name]  Supervising Physician: Jacqulynn Cadet  Patient Status: [facility] - Out-pt  History of Present Illness: Jacqueline Hunt is a 87 y.o. female outpatient. History of brain tumor, parathyroid adenoma, HTN,  RUL NSCL s/p lobectomy in 2020.  Currently on surveillance. Per patient a PET scan was performed recently at OSH that showed liver lesions that were not hypermetabolic. MR liver ordered 12.2.22 reads Multiple hepatic lesions which have imaging characteristics highly concerning for widespread metastatic disease to the liver. Team is requesting a liver lesion biopsy for further evaluation of possible metastasis.   Currently without any significant complaints. Patient alert sitting up on the side of the bed, calm and comfortable. Endorses some dizziness that she attributes to her NPO status. Denies any fevers, headache, chest pain, SOB, cough, abdominal pain, nausea, vomiting or bleeding. Return precautions and treatment recommendations and follow-up discussed with the patient  who is agreeable with the plan.     Past Medical History:  Diagnosis Date   Arthritis    Brain tumor ([place])    High blood pressure    Lung cancer ([place])    Osteopenia    Osteoporosis    Parathyroid adenoma     Past Surgical History:  Procedure Laterality Date   brain meningioma excesion     CATARACT EXTRACTION     CESAREAN SECTION     GALLBLADDER SURGERY     gallbladder surgery      LOBECTOMY     TONSILLECTOMY     TONSILLECTOMY     WRIST FRACTURE SURGERY      Allergies: Patient has no known allergies.  Medications: Prior to Admission medications   Medication Sig Start Date End Date Taking? Authorizing Provider  aspirin EC 81 MG tablet Take 81 mg by mouth daily.    [provider]  Cholecalciferol (VITAMIN D3 PO) Take 500 mg by mouth daily.    [provider]  Doxylamine  Succinate, Sleep, (UNISOM PO) Take by mouth.    [provider]  estradiol (CLIMARA - DOSED IN MG/24 HR) 0.05 mg/24hr patch Place 1 patch (0.05 mg total) onto the skin once a week. 12/13/20   Reed, Tiffany L, DO  latanoprost (XALATAN) 0.005 % ophthalmic solution Place 1 drop into both eyes at bedtime.    [provider]  lisinopril (ZESTRIL) 10 MG tablet Take 10 mg by mouth 2 (two) times daily.    [provider]  Multiple Vitamins-Minerals (DAILY MULTIVITAMIN PO) Take 1 tablet by mouth daily.    [provider]  rosuvastatin (CRESTOR) 5 MG tablet Take 5 mg by mouth every evening.    [provider]  vitamin B-12 (CYANOCOBALAMIN) 500 MCG tablet Take 500 mcg by mouth daily.    [provider]     No family history on file.  Social History   Socioeconomic History   Marital status: Divorced    Spouse name: Not on file   Number of children: Not on file   Years of education: Not on file   Highest education level: Not on file  Occupational History   Not on file  Tobacco Use   Smoking status: Former    Packs/day: 1.00    Years: 57.10    Pack years: 57.10    Types: Cigarettes    Quit date: 1964    Years since quitting: 59.0   Smokeless tobacco: Never  Vaping Use   Vaping  Use: Never used  Substance and Sexual Activity   Alcohol use: Never   Drug use: Never   Sexual activity: Not Currently  Other Topics Concern   Not on file  Social History Narrative   Diet: Nothing Special      Do you drink/ eat things with caffeine?  Coffe      Marital status:   Divorced                            What year were you married ? 1951      Do you live in a house, apartment,assistred living, condo, trailer, etc.)? Apartment      Is it one or more stories? First Floor      How many persons live in your home ? One      Do you have any pets in your home ?(please list) Cat      Highest Level of education completed: PhD      Current or past  profession:  Former Scientist, physiological + Professor , Social Work      Do you exercise?      Walk                        Type & how often  Just Power Walk Daily      ADVANCED DIRECTIVES (Please bring copies)      Do you have a living will? Yes      Do you have a DNR form?    Yes                   If not, do you want to discuss one?       Do you have signed POA?HPOA forms?  Yes               If so, please bring to your appointment      FUNCTIONAL STATUS- To be completed by Spouse / child / Staff       Do you have difficulty bathing or dressing yourself ? No      Do you have difficulty preparing food or eating ? No      Do you have difficulty managing your mediation ? No      Do you have difficulty managing your finances ? No      Do you have difficulty affording your medication ? No      Social Determinants of Radio broadcast assistant Strain: Not on file  Food Insecurity: Not on file  Transportation Needs: Not on file  Physical Activity: Not on file  Stress: Not on file  Social Connections: Not on file    Review of Systems: A 12 point ROS discussed and pertinent positives are indicated in the HPI above.  All other systems are negative.  Review of Systems  Constitutional:  Negative for fatigue and fever.  HENT:  Negative for congestion.   Respiratory:  Negative for cough and shortness of breath.   Gastrointestinal:  Negative for abdominal pain, diarrhea, nausea and vomiting.   Vital Signs: There were no vitals taken for this visit.  Physical Exam Vitals and nursing note reviewed.  Constitutional:      Appearance: She is well-developed.  HENT:     Head: Normocephalic and atraumatic.  Eyes:     Conjunctiva/sclera: Conjunctivae normal.  Cardiovascular:     Rate and Rhythm: Normal rate and regular rhythm.  Heart sounds: Normal heart sounds.  Pulmonary:     Effort: Pulmonary effort is normal.  Musculoskeletal:        General: Normal range of motion.     Cervical back:  Normal range of motion.  Skin:    General: Skin is warm.  Neurological:     Mental Status: She is alert and oriented to person, place, and time.    Imaging: CT Chest W Contrast  Result Date: 10/18/2021 CLINICAL DATA:  Restaging non-small cell lung cancer. EXAM: CT CHEST WITH CONTRAST TECHNIQUE: Multidetector CT imaging of the chest was performed during intravenous contrast administration. CONTRAST:  67mL OMNIPAQUE IOHEXOL 350 MG/ML SOLN COMPARISON:  04/15/2021 FINDINGS: Cardiovascular: Mild cardiac enlargement. Aortic atherosclerosis and coronary artery calcifications. Ascending thoracic aorta measures 4 cm on today's study, image 30/6. Previously this was measured at 4.2 cm. No pericardial effusion identified. Mediastinum/Nodes: No enlarged mediastinal, hilar, or axillary lymph nodes. Thyroid gland, trachea, and esophagus demonstrate no significant findings. Lungs/Pleura: No pleural effusion identified. Postoperative change from right upper lobectomy. There are a few small millimetric lung nodules which are unchanged from the previous exam from 10/17/2021. The largest is in the posterior left upper lobe measuring 4 mm, image 27/5. No new or enlarging lung nodules. Upper Abdomen: No acute abnormality. Subcapsular lesion within the posterior right lobe of liver measures 1.9 cm, unchanged from 10/13/2020, image 130/2. Adjacent low-density structure is difficult to confidently identify on the prior exams measuring 1.1 cm, image 130/2. Within the dome of liver there is a indeterminate peripherally enhancing low-density structure measuring 1.7 cm, image 34/6. Also not confidently noted on prior exams. Similar appearance of previously characterized thickening of the left adrenal gland, image 137/2 Musculoskeletal: No chest wall abnormality. No acute or significant osseous findings. IMPRESSION: 1. Stable CT of the chest status post right upper lobectomy. No specific findings identified to suggest local tumor  recurrence or metastatic disease. 2. There are 3 indeterminate low-density lesions within the liver which were not confidently noted on prior exams (differences may reflect differences in technique). In [place] there are reports from [facility] and [place] which described multiple liver lesions which were which were characterized by PET-CT as benign. If these studies can be retrieved I would be happy to review those studies and addend this exam reflecting any change or stability of these lesions. 3. Ascending thoracic aorta measures 4 cm on today's study, unchanged from 10/13/2020. Previously this was measured at 4.2 cm. Recommend annual imaging followup by CTA or MRA. This recommendation follows 2010 ACCF/AHA/AATS/ACR/ASA/SCA/SCAI/SIR/STS/SVM Guidelines for the Diagnosis and Management of Patients with Thoracic Aortic Disease. Circulation. 2010; 121: N867-E720. Aortic aneurysm NOS (ICD10-I71.9) 4. Aortic Atherosclerosis (ICD10-I70.0). Electronically Signed   By: Kerby Moors M.D.   On: 10/18/2021 14:07   MR LIVER W WO CONTRAST  Result Date: 10/29/2021 CLINICAL DATA:  87 year old female with history of non-small cell lung cancer with suspected metastatic disease to the liver. EXAM: MRI ABDOMEN WITHOUT AND WITH CONTRAST TECHNIQUE: Multiplanar multisequence MR imaging of the abdomen was performed both before and after the administration of intravenous contrast. CONTRAST:  61mL GADAVIST GADOBUTROL 1 MMOL/ML IV SOLN COMPARISON:  No prior abdominal MRI.  CT of the chest 10/17/2021. FINDINGS: Comment: Portions of today's examination are limited by considerable patient respiratory motion. Lower chest: Pectus excavatum. Hepatobiliary: Respiratory motion considerably limits assessment of the liver, however, there clearly multiple lesions which are generally slightly T1 hypointense, of variable T2 signal intensity but slightly T2  hyperintense, appear hypovascular on post gadolinium imaging, and  demonstrate diffusion restriction, highly concerning for widespread metastatic disease. These lesions overall are best demonstrated on diffusion-weighted sequences, where the largest of these is in the posterior aspect of the right lobe of the liver between segments 6 and 7 (axial image 47 of series 6) measuring 2.4 x 2.0 cm. No intrahepatic biliary ductal dilatation. Status post cholecystectomy. Common bile duct is mildly dilated measuring 9 mm within the porta hepatis, within normal limits given the patient's advanced age and post cholecystectomy status. There are no filling defects within the common bile duct to suggest choledocholithiasis. Pancreas: No pancreatic mass. No pancreatic ductal dilatation. No pancreatic or peripancreatic fluid collections or inflammatory changes. Spleen:  Unremarkable. Adrenals/Urinary Tract: Exophytic 1.7 x 1.2 cm T1 hypointense, T2 hyperintense, nonenhancing lesion extending off the lower pole of the right kidney laterally, compatible with a small simple cyst. Left kidney and bilateral adrenal glands are normal in appearance. No hydroureteronephrosis in the visualized portions of the abdomen. Stomach/Bowel: Visualized portions are unremarkable. Vascular/Lymphatic: Aortic atherosclerosis, without evidence of aneurysm in the abdominal vasculature. No lymphadenopathy noted in the abdomen. Other: No significant volume of ascites noted in the visualized portions of the peritoneal cavity. Musculoskeletal: No aggressive appearing osseous lesions are noted in the visualized portions of the skeleton. IMPRESSION: 1. Multiple hepatic lesions which have imaging characteristics highly concerning for widespread metastatic disease to the liver, as detailed above. 2. Aortic atherosclerosis. 3. Additional incidental findings, as above. Electronically Signed   By: Vinnie Langton M.D.   On: 10/29/2021 07:20   MR 3D Recon At Scanner  Result Date: 10/29/2021 CLINICAL DATA:  87 year old female  with history of non-small cell lung cancer with suspected metastatic disease to the liver. EXAM: MRI ABDOMEN WITHOUT AND WITH CONTRAST TECHNIQUE: Multiplanar multisequence MR imaging of the abdomen was performed both before and after the administration of intravenous contrast. CONTRAST:  97mL GADAVIST GADOBUTROL 1 MMOL/ML IV SOLN COMPARISON:  No prior abdominal MRI.  CT of the chest 10/17/2021. FINDINGS: Comment: Portions of today's examination are limited by considerable patient respiratory motion. Lower chest: Pectus excavatum. Hepatobiliary: Respiratory motion considerably limits assessment of the liver, however, there clearly multiple lesions which are generally slightly T1 hypointense, of variable T2 signal intensity but slightly T2 hyperintense, appear hypovascular on post gadolinium imaging, and demonstrate diffusion restriction, highly concerning for widespread metastatic disease. These lesions overall are best demonstrated on diffusion-weighted sequences, where the largest of these is in the posterior aspect of the right lobe of the liver between segments 6 and 7 (axial image 47 of series 6) measuring 2.4 x 2.0 cm. No intrahepatic biliary ductal dilatation. Status post cholecystectomy. Common bile duct is mildly dilated measuring 9 mm within the porta hepatis, within normal limits given the patient's advanced age and post cholecystectomy status. There are no filling defects within the common bile duct to suggest choledocholithiasis. Pancreas: No pancreatic mass. No pancreatic ductal dilatation. No pancreatic or peripancreatic fluid collections or inflammatory changes. Spleen:  Unremarkable. Adrenals/Urinary Tract: Exophytic 1.7 x 1.2 cm T1 hypointense, T2 hyperintense, nonenhancing lesion extending off the lower pole of the right kidney laterally, compatible with a small simple cyst. Left kidney and bilateral adrenal glands are normal in appearance. No hydroureteronephrosis in the visualized portions of the  abdomen. Stomach/Bowel: Visualized portions are unremarkable. Vascular/Lymphatic: Aortic atherosclerosis, without evidence of aneurysm in the abdominal vasculature. No lymphadenopathy noted in the abdomen. Other: No significant volume of ascites noted in the visualized portions  of the peritoneal cavity. Musculoskeletal: No aggressive appearing osseous lesions are noted in the visualized portions of the skeleton. IMPRESSION: 1. Multiple hepatic lesions which have imaging characteristics highly concerning for widespread metastatic disease to the liver, as detailed above. 2. Aortic atherosclerosis. 3. Additional incidental findings, as above. Electronically Signed   By: Vinnie Langton M.D.   On: 10/29/2021 07:20    Labs:  CBC: Recent Labs    04/15/21 1051 10/17/21 1206  WBC 4.3 4.6  HGB 13.1 13.3  HCT 38.6 40.2  PLT 153 153    COAGS: No results for input(s): INR, APTT in the last 8760 hours.  BMP: Recent Labs    04/15/21 1051 10/17/21 1206  NA 140 142  K 4.3 3.7  CL 105 106  CO2 27 28  GLUCOSE 89 83  BUN 19 15  CALCIUM 9.5 9.0  CREATININE 0.80 0.78  GFRNONAA >60 >60    LIVER FUNCTION TESTS: Recent Labs    04/15/21 1051 10/17/21 1206  BILITOT 0.6 0.4  AST 19 19  ALT 15 16  ALKPHOS 60 69  PROT 7.1 7.0  ALBUMIN 4.1 4.1      Assessment and Plan:  87 y.o. female outpatient. History of brain tumor, parathyroid adenoma, HTN,  RUL NSCL s/p lobectomy in 2020.  Currently on surveillance. Per patient a PET scan was performed recently at OSH that showed liver lesions that were not hypermetabolic. MR liver ordered 12.2.22 reads Multiple hepatic lesions which have imaging characteristics highly concerning for widespread metastatic disease to the liver. Team is requesting a liver lesion biopsy for further evaluation of possible metastasis.   All labs and medications are within acceptable parameters. NKDA. Patient has been NPO since midnight.   Risks and benefits of liver lesion  biopsy was discussed with the patient and/or patient's family including, but not limited to bleeding, infection, damage to adjacent structures or low yield requiring additional tests.  All of the questions were answered and there is agreement to proceed.  Consent signed and in chart.   Thank you for this interesting consult.  I greatly enjoyed meeting Jacqueline Hunt and look forward to participating in their care.  A copy of this report was sent to the requesting provider on this date.  Electronically Signed: Jacqualine Mau, NP 11/15/2021, 11:11 AM   I spent a total of  30 Minutes   in face to face in clinical consultation, greater than 50% of which was counseling/coordinating care for liver lesion biopsy

## 2021-11-17 ENCOUNTER — Telehealth: Payer: Self-pay | Admitting: Internal Medicine

## 2021-11-17 LAB — SURGICAL PATHOLOGY

## 2021-11-17 NOTE — Telephone Encounter (Signed)
Sch per 12/13 los, pt aware.

## 2021-11-22 ENCOUNTER — Inpatient Hospital Stay: Payer: Medicare HMO

## 2021-11-22 ENCOUNTER — Other Ambulatory Visit: Payer: Self-pay

## 2021-11-22 ENCOUNTER — Inpatient Hospital Stay: Payer: Medicare HMO | Admitting: Internal Medicine

## 2021-11-22 VITALS — BP 159/87 | HR 86 | Temp 98.1°F | Resp 18 | Wt 114.1 lb

## 2021-11-22 DIAGNOSIS — C7B8 Other secondary neuroendocrine tumors: Secondary | ICD-10-CM | POA: Diagnosis not present

## 2021-11-22 DIAGNOSIS — Z902 Acquired absence of lung [part of]: Secondary | ICD-10-CM | POA: Diagnosis not present

## 2021-11-22 DIAGNOSIS — C3490 Malignant neoplasm of unspecified part of unspecified bronchus or lung: Secondary | ICD-10-CM | POA: Diagnosis not present

## 2021-11-22 DIAGNOSIS — I119 Hypertensive heart disease without heart failure: Secondary | ICD-10-CM | POA: Diagnosis not present

## 2021-11-22 DIAGNOSIS — C3491 Malignant neoplasm of unspecified part of right bronchus or lung: Secondary | ICD-10-CM

## 2021-11-22 DIAGNOSIS — C7A8 Other malignant neuroendocrine tumors: Secondary | ICD-10-CM | POA: Diagnosis not present

## 2021-11-22 DIAGNOSIS — Q676 Pectus excavatum: Secondary | ICD-10-CM | POA: Diagnosis not present

## 2021-11-22 DIAGNOSIS — C3411 Malignant neoplasm of upper lobe, right bronchus or lung: Secondary | ICD-10-CM

## 2021-11-22 DIAGNOSIS — C787 Secondary malignant neoplasm of liver and intrahepatic bile duct: Secondary | ICD-10-CM | POA: Diagnosis not present

## 2021-11-22 DIAGNOSIS — Z9049 Acquired absence of other specified parts of digestive tract: Secondary | ICD-10-CM | POA: Diagnosis not present

## 2021-11-22 DIAGNOSIS — I700 Atherosclerosis of aorta: Secondary | ICD-10-CM | POA: Diagnosis not present

## 2021-11-22 DIAGNOSIS — Z79899 Other long term (current) drug therapy: Secondary | ICD-10-CM | POA: Diagnosis not present

## 2021-11-22 LAB — CMP (CANCER CENTER ONLY)
ALT: 14 U/L (ref 0–44)
AST: 17 U/L (ref 15–41)
Albumin: 4.1 g/dL (ref 3.5–5.0)
Alkaline Phosphatase: 60 U/L (ref 38–126)
Anion gap: 5 (ref 5–15)
BUN: 21 mg/dL (ref 8–23)
CO2: 30 mmol/L (ref 22–32)
Calcium: 9.5 mg/dL (ref 8.9–10.3)
Chloride: 104 mmol/L (ref 98–111)
Creatinine: 0.71 mg/dL (ref 0.44–1.00)
GFR, Estimated: >60 mL/min (ref >60)
Glucose, Bld: 89 mg/dL (ref 70–99)
Potassium: 4 mmol/L (ref 3.5–5.1)
Sodium: 139 mmol/L (ref 135–145)
Total Bilirubin: 0.3 mg/dL (ref 0.3–1.2)
Total Protein: 6.9 g/dL (ref 6.5–8.1)

## 2021-11-22 LAB — CBC WITH DIFFERENTIAL (CANCER CENTER ONLY)
Abs Immature Granulocytes: 0 10*3/uL (ref 0.00–0.07)
Basophils Absolute: 0 10*3/uL (ref 0.0–0.1)
Basophils Relative: 1 %
Eosinophils Absolute: 0.1 10*3/uL (ref 0.0–0.5)
Eosinophils Relative: 1 %
HCT: 39.2 % (ref 36.0–46.0)
Hemoglobin: 13 g/dL (ref 12.0–15.0)
Immature Granulocytes: 0 %
Lymphocytes Relative: 31 %
Lymphs Abs: 1.3 10*3/uL (ref 0.7–4.0)
MCH: 30.1 pg (ref 26.0–34.0)
MCHC: 33.2 g/dL (ref 30.0–36.0)
MCV: 90.7 fL (ref 80.0–100.0)
Monocytes Absolute: 0.3 10*3/uL (ref 0.1–1.0)
Monocytes Relative: 7 %
Neutro Abs: 2.5 10*3/uL (ref 1.7–7.7)
Neutrophils Relative %: 60 %
Platelet Count: 168 10*3/uL (ref 150–400)
RBC: 4.32 MIL/uL (ref 3.87–5.11)
RDW: 12.9 % (ref 11.5–15.5)
WBC Count: 4.2 10*3/uL (ref 4.0–10.5)
nRBC: 0 % (ref 0.0–0.2)

## 2021-11-22 NOTE — Progress Notes (Signed)
[facility] [phone]   Fax:([phone]) [phone]  OFFICE PROGRESS NOTE  [name], MD [place] [address]  DIAGNOSIS:  1) stage Ib (T2 a, N0, M0) non-small cell lung cancer, adenocarcinoma predominantly acinar with lipidic features diagnosed in August 2020. Molecular studies showed negative for EGFR mutation and negative PD-L1 expression. 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   PRIOR THERAPY:  Status post right upper lobectomy with lymph node dissection in [place]).   CURRENT THERAPY: Observation.  INTERVAL HISTORY: Jacqueline Hunt 87 y.o. female returns to the clinic today for follow-up visit accompanied by her son.  The patient is feeling fine today with no concerning complaints.  She denied having any current chest pain, shortness of breath, cough or hemoptysis.  She denied having any fever or chills.  She has no nausea, vomiting, diarrhea or constipation.  She has no headache or visual changes.  She has no hot flashes.  She had ultrasound-guided core biopsy of one of the suspicious liver lesions and the final pathology was consistent with well-differentiated neuroendocrine tumor likely of gastrointestinal primary.  The patient is here today for evaluation and discussion of her treatment options.   MEDICAL HISTORY: Past Medical History:  Diagnosis Date   Arthritis    Brain tumor ([place])    Constipation due to opioid therapy    hospitalized d/t constipation   Dyspnea    High blood pressure    Lung cancer ([facility])    Osteopenia    Osteoporosis    Parathyroid adenoma     ALLERGIES:  has No Known Allergies.  MEDICATIONS:  Current Outpatient Medications  Medication Sig Dispense Refill   aspirin EC 81 MG tablet Take 81 mg by mouth daily.     Cholecalciferol (VITAMIN D3 PO) Take 500 mg by mouth daily.     Doxylamine Succinate, Sleep, (UNISOM PO) Take  by mouth.     estradiol (CLIMARA - DOSED IN MG/24 HR) 0.05 mg/24hr patch Place 1 patch (0.05 mg total) onto the skin once a week. 4 patch 4   latanoprost (XALATAN) 0.005 % ophthalmic solution Place 1 drop into both eyes at bedtime.     lisinopril (ZESTRIL) 10 MG tablet Take 10 mg by mouth 2 (two) times daily.     Multiple Vitamins-Minerals (DAILY MULTIVITAMIN PO) Take 1 tablet by mouth daily.     rosuvastatin (CRESTOR) 5 MG tablet Take 5 mg by mouth every evening.     vitamin B-12 (CYANOCOBALAMIN) 500 MCG tablet Take 500 mcg by mouth daily.     No current facility-administered medications for this visit.    SURGICAL HISTORY:  Past Surgical History:  Procedure Laterality Date   brain meningioma excesion     CATARACT EXTRACTION     CESAREAN SECTION     GALLBLADDER SURGERY     gallbladder surgery      LOBECTOMY     TONSILLECTOMY     TONSILLECTOMY     WRIST FRACTURE SURGERY      REVIEW OF SYSTEMS:  Constitutional: negative Eyes: negative Ears, nose, mouth, throat, and face: negative Respiratory: negative Cardiovascular: negative Gastrointestinal: negative Genitourinary:negative Integument/breast: negative Hematologic/lymphatic: negative Musculoskeletal:negative Neurological: negative Behavioral/Psych: negative Endocrine: negative Allergic/Immunologic: negative   PHYSICAL EXAMINATION: General appearance: alert, cooperative, and no distress Head: Normocephalic, without obvious abnormality, atraumatic Neck: no adenopathy, no JVD, supple, symmetrical, trachea midline, and thyroid not enlarged, symmetric, no tenderness/mass/nodules Lymph  nodes: Cervical, supraclavicular, and axillary nodes normal. Resp: clear to auscultation bilaterally Back: symmetric, no curvature. ROM normal. No CVA tenderness. Cardio: regular rate and rhythm, S1, S2 normal, no murmur, click, rub or gallop GI: soft, non-tender; bowel sounds normal; no masses,  no organomegaly Extremities: extremities normal,  atraumatic, no cyanosis or edema Neurologic: Alert and oriented X 3, normal strength and tone. Normal symmetric reflexes. Normal coordination and gait  ECOG PERFORMANCE STATUS: 1 - Symptomatic but completely ambulatory  Blood pressure (!) 159/87, pulse 86, temperature 98.1 F (36.7 C), temperature source Oral, resp. rate 18, weight 114 lb 1 oz (51.7 kg), SpO2 98 %.  LABORATORY DATA: Lab Results  Component Value Date   WBC 4.2 11/22/2021   HGB 13.0 11/22/2021   HCT 39.2 11/22/2021   MCV 90.7 11/22/2021   PLT 168 11/22/2021      Chemistry      Component Value Date/Time   NA 139 11/22/2021 1507   K 4.0 11/22/2021 1507   CL 104 11/22/2021 1507   CO2 30 11/22/2021 1507   BUN 21 11/22/2021 1507   BUN 20 03/12/2020 0000   CREATININE 0.71 11/22/2021 1507   CREATININE 0.67 08/30/2020 1512   GLU 179 03/12/2020 0000      Component Value Date/Time   CALCIUM 9.5 11/22/2021 1507   ALKPHOS 60 11/22/2021 1507   AST 17 11/22/2021 1507   ALT 14 11/22/2021 1507   BILITOT 0.3 11/22/2021 1507       RADIOGRAPHIC STUDIES: MR LIVER W WO CONTRAST  Result Date: 10/29/2021 CLINICAL DATA:  87 year old female with history of non-small cell lung cancer with suspected metastatic disease to the liver. EXAM: MRI ABDOMEN WITHOUT AND WITH CONTRAST TECHNIQUE: Multiplanar multisequence MR imaging of the abdomen was performed both before and after the administration of intravenous contrast. CONTRAST:  22mL GADAVIST GADOBUTROL 1 MMOL/ML IV SOLN COMPARISON:  No prior abdominal MRI.  CT of the chest 10/17/2021. FINDINGS: Comment: Portions of today's examination are limited by considerable patient respiratory motion. Lower chest: Pectus excavatum. Hepatobiliary: Respiratory motion considerably limits assessment of the liver, however, there clearly multiple lesions which are generally slightly T1 hypointense, of variable T2 signal intensity but slightly T2 hyperintense, appear hypovascular on post gadolinium  imaging, and demonstrate diffusion restriction, highly concerning for widespread metastatic disease. These lesions overall are best demonstrated on diffusion-weighted sequences, where the largest of these is in the posterior aspect of the right lobe of the liver between segments 6 and 7 (axial image 47 of series 6) measuring 2.4 x 2.0 cm. No intrahepatic biliary ductal dilatation. Status post cholecystectomy. Common bile duct is mildly dilated measuring 9 mm within the porta hepatis, within normal limits given the patient's advanced age and post cholecystectomy status. There are no filling defects within the common bile duct to suggest choledocholithiasis. Pancreas: No pancreatic mass. No pancreatic ductal dilatation. No pancreatic or peripancreatic fluid collections or inflammatory changes. Spleen:  Unremarkable. Adrenals/Urinary Tract: Exophytic 1.7 x 1.2 cm T1 hypointense, T2 hyperintense, nonenhancing lesion extending off the lower pole of the right kidney laterally, compatible with a small simple cyst. Left kidney and bilateral adrenal glands are normal in appearance. No hydroureteronephrosis in the visualized portions of the abdomen. Stomach/Bowel: Visualized portions are unremarkable. Vascular/Lymphatic: Aortic atherosclerosis, without evidence of aneurysm in the abdominal vasculature. No lymphadenopathy noted in the abdomen. Other: No significant volume of ascites noted in the visualized portions of the peritoneal cavity. Musculoskeletal: No aggressive appearing osseous lesions are noted in the visualized  portions of the skeleton. IMPRESSION: 1. Multiple hepatic lesions which have imaging characteristics highly concerning for widespread metastatic disease to the liver, as detailed above. 2. Aortic atherosclerosis. 3. Additional incidental findings, as above. Electronically Signed   By: Vinnie Langton M.D.   On: 10/29/2021 07:20   MR 3D Recon At Scanner  Result Date: 10/29/2021 CLINICAL DATA:   87 year old female with history of non-small cell lung cancer with suspected metastatic disease to the liver. EXAM: MRI ABDOMEN WITHOUT AND WITH CONTRAST TECHNIQUE: Multiplanar multisequence MR imaging of the abdomen was performed both before and after the administration of intravenous contrast. CONTRAST:  57mL GADAVIST GADOBUTROL 1 MMOL/ML IV SOLN COMPARISON:  No prior abdominal MRI.  CT of the chest 10/17/2021. FINDINGS: Comment: Portions of today's examination are limited by considerable patient respiratory motion. Lower chest: Pectus excavatum. Hepatobiliary: Respiratory motion considerably limits assessment of the liver, however, there clearly multiple lesions which are generally slightly T1 hypointense, of variable T2 signal intensity but slightly T2 hyperintense, appear hypovascular on post gadolinium imaging, and demonstrate diffusion restriction, highly concerning for widespread metastatic disease. These lesions overall are best demonstrated on diffusion-weighted sequences, where the largest of these is in the posterior aspect of the right lobe of the liver between segments 6 and 7 (axial image 47 of series 6) measuring 2.4 x 2.0 cm. No intrahepatic biliary ductal dilatation. Status post cholecystectomy. Common bile duct is mildly dilated measuring 9 mm within the porta hepatis, within normal limits given the patient's advanced age and post cholecystectomy status. There are no filling defects within the common bile duct to suggest choledocholithiasis. Pancreas: No pancreatic mass. No pancreatic ductal dilatation. No pancreatic or peripancreatic fluid collections or inflammatory changes. Spleen:  Unremarkable. Adrenals/Urinary Tract: Exophytic 1.7 x 1.2 cm T1 hypointense, T2 hyperintense, nonenhancing lesion extending off the lower pole of the right kidney laterally, compatible with a small simple cyst. Left kidney and bilateral adrenal glands are normal in appearance. No hydroureteronephrosis in the  visualized portions of the abdomen. Stomach/Bowel: Visualized portions are unremarkable. Vascular/Lymphatic: Aortic atherosclerosis, without evidence of aneurysm in the abdominal vasculature. No lymphadenopathy noted in the abdomen. Other: No significant volume of ascites noted in the visualized portions of the peritoneal cavity. Musculoskeletal: No aggressive appearing osseous lesions are noted in the visualized portions of the skeleton. IMPRESSION: 1. Multiple hepatic lesions which have imaging characteristics highly concerning for widespread metastatic disease to the liver, as detailed above. 2. Aortic atherosclerosis. 3. Additional incidental findings, as above. Electronically Signed   By: Vinnie Langton M.D.   On: 10/29/2021 07:20   US BIOPSY (LIVER)  Result Date: 11/15/2021 INDICATION: 87 year old female with a history of small cell lung cancer with new hepatic lesions concerning for metastatic disease. She presents for biopsy of 1 of these lesions. EXAM: ULTRASOUND BIOPSY CORE LIVER MEDICATIONS: None. ANESTHESIA/SEDATION: Moderate (conscious) sedation was employed during this procedure. A total of Versed 1 mg and Fentanyl 50 mcg was administered intravenously. Moderate Sedation Time: 10 minutes. The patient's level of consciousness and vital signs were monitored continuously by radiology nursing throughout the procedure under my direct supervision. FLUOROSCOPY TIME:  None. COMPLICATIONS: None immediate. PROCEDURE: Informed written consent was obtained from the patient after a thorough discussion of the procedural risks, benefits and alternatives. All questions were addressed. Maximal Sterile Barrier Technique was utilized including caps, mask, sterile gowns, sterile gloves, sterile drape, hand hygiene and skin antiseptic. A timeout was performed prior to the initiation of the procedure. The right upper quadrant was  interrogated with ultrasound. Lesions demonstrated on MRI are very stealthy by  ultrasound. A single lesion in the posterior aspect of hepatic segment 6 is well visualized. A suitable skin entry site was selected and marked. The region was sterilely prepped and draped in the standard fashion using chlorhexidine skin prep. Local anesthesia was attained by infiltration with 1% lidocaine. A small dermatotomy was made. Under real-time sonographic guidance, a 17 gauge introducer needle was advanced into the margin of the mass. Multiple 18 gauge biopsies were then coaxially obtained using the BioPince automated biopsy device. Biopsy samples were placed in formalin and delivered to pathology for further analysis. As a 3 gauge introducer needle was removed, the biopsy tract was embolized with a Gel-Foam slurry. Post biopsy ultrasound imaging demonstrates no evidence of active hemorrhage or perihepatic hematoma. The patient tolerated the procedure well. IMPRESSION: Technically successful ultrasound-guided core biopsy of hepatic lesion. Of note, the hepatic lesions are not well seen by ultrasound in comparison to the MR imaging. Electronically Signed   By: Jacqulynn Cadet M.D.   On: 11/15/2021 17:26     ASSESSMENT AND PLAN: This is a very pleasant 87 years old white female diagnosed with stage Ib (T2 a, N0, M0) non-small cell lung cancer, adenocarcinoma predominantly acinar with lipidic features diagnosed in August 2020 status post right upper lobectomy with lymph node dissection in [place]). Molecular studies showed negative for EGFR mutation and negative PD-L1 expression. The patient is currently on observation and she is feeling fine today with no concerning complaints. Her recent scan of the chest showed no concerning findings for disease recurrence or progression in the chest but there was suspicious liver lesion that was seen on previous PET scan when she was in [place] and they were not hypermetabolic but still concerning.  I recommended for the patient to have MRI of the  liver for evaluation of this lesion and to rule out metastatic disease. I ordered MRI of the liver that was performed recently and unfortunately showed concerning findings for liver metastasis. The patient underwent ultrasound-guided core biopsy of one of the liver lesion and the final pathology was consistent with well-differentiated neuroendocrine carcinoma of gastrointestinal primary. I had a lengthy discussion with the patient and her son about her current condition and treatment options. I explained to the patient that with the well differentiated neuroendocrine carcinoma and since she is currently asymptomatic I would recommend continuous observation and monitoring especially with her age and the risk of starting her on any systemic therapy which probably is not needed anyway at this point. I also recommend for the patient a second opinion with Dr. Mia Creek at [facility] for the specialized in this area. I will see her back for follow-up visit in 6 months for evaluation with repeat CT scan of the chest, abdomen and pelvis for restaging of her disease. The patient was advised to call immediately if she has any other concerning symptoms in the interval.  The patient voices understanding of current disease status and treatment options and is in agreement with the current care plan.  All questions were answered. The patient knows to call the clinic with any problems, questions or concerns. We can certainly see the patient much sooner if necessary.  The total time spent in the appointment was 34 minutes.  Disclaimer: This note was dictated with voice recognition software. Similar sounding words can inadvertently be transcribed and may not be corrected upon review.

## 2021-11-23 ENCOUNTER — Telehealth: Payer: Self-pay | Admitting: Medical Oncology

## 2021-11-23 NOTE — Telephone Encounter (Signed)
LVM for new pt referral for Dr Leamon Arnt. Records / demo/insurance ready to fax. Email sent to [employer] for assistance with referral..

## 2021-11-30 ENCOUNTER — Inpatient Hospital Stay
Admission: RE | Admit: 2021-11-30 | Discharge: 2021-11-30 | Disposition: A | Discharge status: Home / Self Care | Payer: Self-pay | Source: Ambulatory Visit | Attending: Internal Medicine | Admitting: Internal Medicine

## 2021-11-30 ENCOUNTER — Ambulatory Visit
Admission: RE | Admit: 2021-11-30 | Discharge: 2021-11-30 | Disposition: A | Discharge status: Home / Self Care | Payer: Self-pay | Source: Ambulatory Visit | Attending: Internal Medicine | Admitting: Internal Medicine

## 2021-11-30 ENCOUNTER — Ambulatory Visit: Payer: Medicare HMO | Admitting: Internal Medicine

## 2021-11-30 ENCOUNTER — Other Ambulatory Visit: Payer: Medicare HMO

## 2021-11-30 ENCOUNTER — Other Ambulatory Visit (HOSPITAL_COMMUNITY): Payer: Self-pay | Admitting: Internal Medicine

## 2021-11-30 DIAGNOSIS — C801 Malignant (primary) neoplasm, unspecified: Secondary | ICD-10-CM

## 2021-12-01 ENCOUNTER — Encounter: Payer: Self-pay | Admitting: Internal Medicine

## 2021-12-02 ENCOUNTER — Telehealth: Payer: Self-pay | Admitting: Internal Medicine

## 2021-12-02 NOTE — Telephone Encounter (Signed)
Sch per 12/27, pt aware

## 2021-12-15 ENCOUNTER — Other Ambulatory Visit: Payer: Self-pay | Admitting: *Deleted

## 2021-12-15 DIAGNOSIS — H401131 Primary open-angle glaucoma, bilateral, mild stage: Secondary | ICD-10-CM | POA: Diagnosis not present

## 2021-12-15 MED ORDER — ESTRADIOL 0.05 MG/24HR TD PTWK: 0.0500 mg | MEDICATED_PATCH | TRANSDERMAL | 3 refills | Status: DC

## 2021-12-15 NOTE — Telephone Encounter (Signed)
Patient requested refill

## 2022-01-02 ENCOUNTER — Telehealth: Payer: Self-pay | Admitting: *Deleted

## 2022-01-02 ENCOUNTER — Encounter: Payer: Self-pay | Admitting: Internal Medicine

## 2022-01-02 NOTE — Telephone Encounter (Signed)
Patient contacted office via Mychart to inquire about referral status. Contacted Dr. Leamon Arnt New Patient Appt# @ [phone]. Left Voice mail with patient info and office call back #. Faxed patient demographics; referral; last OV note to Dr. Leamon Arnt' fax [phone].  Fax confirmation received Notified patient of above information - patient verbalized understanding

## 2022-02-01 ENCOUNTER — Encounter: Payer: Self-pay | Admitting: Internal Medicine

## 2022-02-06 DIAGNOSIS — Z20822 Contact with and (suspected) exposure to covid-19: Secondary | ICD-10-CM | POA: Diagnosis not present

## 2022-02-21 DIAGNOSIS — K769 Liver disease, unspecified: Secondary | ICD-10-CM | POA: Diagnosis not present

## 2022-02-21 DIAGNOSIS — D3A8 Other benign neuroendocrine tumors: Secondary | ICD-10-CM | POA: Diagnosis not present

## 2022-03-06 DIAGNOSIS — C7B8 Other secondary neuroendocrine tumors: Secondary | ICD-10-CM | POA: Diagnosis not present

## 2022-03-20 DIAGNOSIS — H401131 Primary open-angle glaucoma, bilateral, mild stage: Secondary | ICD-10-CM | POA: Diagnosis not present

## 2022-04-17 ENCOUNTER — Other Ambulatory Visit (HOSPITAL_COMMUNITY): Payer: Medicare HMO

## 2022-04-17 ENCOUNTER — Other Ambulatory Visit: Payer: Medicare HMO

## 2022-04-19 ENCOUNTER — Ambulatory Visit: Payer: Medicare HMO | Admitting: Internal Medicine

## 2022-04-25 ENCOUNTER — Other Ambulatory Visit: Payer: Self-pay | Admitting: *Deleted

## 2022-04-25 MED ORDER — LISINOPRIL 10 MG PO TABS: 10.0000 mg | ORAL_TABLET | Freq: Two times a day (BID) | ORAL | 1 refills | Status: DC

## 2022-04-25 MED ORDER — ROSUVASTATIN CALCIUM 5 MG PO TABS: 5.0000 mg | ORAL_TABLET | Freq: Every evening | ORAL | 1 refills | Status: DC

## 2022-04-25 NOTE — Telephone Encounter (Signed)
Patient requested refill. Stated that she has switched pharmacies to [facility].   Pended Rx and sent to Dr. Sabra Heck for approval due to [place].

## 2022-05-01 ENCOUNTER — Other Ambulatory Visit: Payer: Self-pay | Admitting: Physician Assistant

## 2022-05-01 DIAGNOSIS — C7B8 Other secondary neuroendocrine tumors: Secondary | ICD-10-CM

## 2022-05-05 ENCOUNTER — Telehealth: Payer: Self-pay | Admitting: Internal Medicine

## 2022-05-05 ENCOUNTER — Encounter: Payer: Self-pay | Admitting: Internal Medicine

## 2022-05-05 NOTE — Telephone Encounter (Signed)
Rescheduled 06/27 appointments per provider pal, patient has been called and patient is notified.

## 2022-05-06 ENCOUNTER — Encounter: Payer: Self-pay | Admitting: Internal Medicine

## 2022-05-12 ENCOUNTER — Encounter: Payer: Self-pay | Admitting: Nurse Practitioner

## 2022-05-12 ENCOUNTER — Ambulatory Visit (INDEPENDENT_AMBULATORY_CARE_PROVIDER_SITE_OTHER): Payer: Medicare HMO | Admitting: Nurse Practitioner

## 2022-05-12 VITALS — BP 120/88 | HR 68 | Temp 97.3°F | Resp 16 | Ht 61.0 in | Wt 115.6 lb

## 2022-05-12 DIAGNOSIS — R059 Cough, unspecified: Secondary | ICD-10-CM | POA: Diagnosis not present

## 2022-05-12 DIAGNOSIS — R519 Headache, unspecified: Secondary | ICD-10-CM | POA: Diagnosis not present

## 2022-05-12 DIAGNOSIS — R6883 Chills (without fever): Secondary | ICD-10-CM

## 2022-05-12 DIAGNOSIS — J069 Acute upper respiratory infection, unspecified: Secondary | ICD-10-CM

## 2022-05-12 MED ORDER — DOXYCYCLINE HYCLATE 100 MG PO TABS: 100.0000 mg | ORAL_TABLET | Freq: Two times a day (BID) | ORAL | 0 refills | Status: DC

## 2022-05-12 NOTE — Patient Instructions (Signed)
  Doxycycline 100 mg by mouth twice daily for 7 days - take with food.   saline wash daily Plain nasal saline spray throughout the day as needed May use tylenol 500 mg 2 tablets every 8 hours as needed aches and pains or sore throat Mucinex DM by mouth twice daily as needed for cough and chest congestion with full glass of water  Keep well hydrated Proper nutrition  Avoid forcefully blowing nose Vit c 1000 mg daily Vit d 2000 units daily  Zinc 50 mg daily

## 2022-05-12 NOTE — Progress Notes (Unsigned)
Careteam: Patient Care Team: [name], MD as PCP - General (Family Medicine) Harlene Ramus (Podiatry)  PLACE OF SERVICE:  [place] Directive information Does Patient Have a Medical Advance Directive?: Yes, Type of Advance Directive: [place];Living will;Out of facility DNR (pink MOST or yellow form), Does patient want to make changes to medical advance directive?: No - Patient declined  No Known Allergies  Chief Complaint  Patient presents with   Acute Visit    Patient complains of bad cough, headache, and chills for the past 4-5 days.     HPI: Patient is a 88 y.o. female due to not feeling well. She has developed a cough that goes all night long. Wanted to get tested to [facility].  Tested herself at home and was negative.  Has a lot of nasal congestion and headache.  Has been taking mucinex but completed.  No sore throat.  Productive cough No fever but had chills for a little while yesterday but none today.   Review of Systems:  Review of Systems  Constitutional:  Positive for chills and malaise/fatigue. Negative for fever and weight loss.  HENT:  Positive for congestion. Negative for sinus pain, sore throat and tinnitus.   Respiratory:  Positive for cough, sputum production and shortness of breath (at baseline).   Cardiovascular:  Negative for chest pain, palpitations and leg swelling.  Gastrointestinal:  Negative for abdominal pain, constipation, diarrhea and heartburn.  Genitourinary:  Negative for dysuria, frequency and urgency.  Musculoskeletal:  Negative for back pain, falls, joint pain and myalgias.  Skin: Negative.   Neurological:  Negative for dizziness and headaches.  Psychiatric/Behavioral:  Negative for depression and memory loss. The patient does not have insomnia.     Past Medical History:  Diagnosis Date   Arthritis    Brain tumor ([facility])    Constipation due to opioid therapy    hospitalized d/t constipation    Dyspnea    High blood pressure    Lung cancer ([facility])    Osteopenia    Osteoporosis    Parathyroid adenoma    Past Surgical History:  Procedure Laterality Date   brain meningioma excesion     CATARACT EXTRACTION     CESAREAN SECTION     GALLBLADDER SURGERY     gallbladder surgery      LOBECTOMY     TONSILLECTOMY     TONSILLECTOMY     WRIST FRACTURE SURGERY     Social History:   reports that she quit smoking about 59 years ago. Her smoking use included cigarettes. She has a 57.10 pack-year smoking history. She has never used smokeless tobacco. She reports that she does not drink alcohol and does not use drugs.  History reviewed. No pertinent family history.  Medications: Patient's Medications  New Prescriptions   No medications on file  Previous Medications   ASPIRIN EC 81 MG TABLET    Take 81 mg by mouth daily.   CHOLECALCIFEROL (VITAMIN D3 PO)    Take 500 mg by mouth daily.   DOXYLAMINE SUCCINATE, SLEEP, (UNISOM PO)    Take by mouth.   ESTRADIOL (CLIMARA - DOSED IN MG/24 HR) 0.05 MG/24HR PATCH    Place 1 patch (0.05 mg total) onto the skin once a week.   LATANOPROST (XALATAN) 0.005 % OPHTHALMIC SOLUTION    Place 1 drop into both eyes at bedtime.   LISINOPRIL (ZESTRIL) 10 MG TABLET    Take 1 tablet (10 mg  total) by mouth 2 (two) times daily.   MULTIPLE VITAMINS-MINERALS (DAILY MULTIVITAMIN PO)    Take 1 tablet by mouth daily.   ROSUVASTATIN (CRESTOR) 5 MG TABLET    Take 1 tablet (5 mg total) by mouth every evening.   VITAMIN B-12 (CYANOCOBALAMIN) 500 MCG TABLET    Take 500 mcg by mouth daily.  Modified Medications   No medications on file  Discontinued Medications   No medications on file    Physical Exam:  Vitals:   05/12/22 1429  BP: 120/88  Pulse: 68  Resp: 16  Temp: (!) 97.3 F (36.3 C)  SpO2: 99%  Weight: 115 lb 9.6 oz (52.4 kg)  Height: 5\' 1"  (1.549 m)   Body mass index is 21.84 kg/m. Wt Readings from Last 3 Encounters:  05/12/22 115 lb 9.6 oz (52.4 kg)   11/22/21 114 lb 1 oz (51.7 kg)  11/08/21 115 lb 4.8 oz (52.3 kg)    Physical Exam***  Labs reviewed: Basic Metabolic Panel: Recent Labs    10/17/21 1206 11/15/21 1147 11/22/21 1507  NA 142 139 139  K 3.7 3.7 4.0  CL 106 105 104  CO2 28 27 30   GLUCOSE 83 93 89  BUN 15 22 21   CREATININE 0.78 0.80 0.71  CALCIUM 9.0 9.1 9.5   Liver Function Tests: Recent Labs    10/17/21 1206 11/15/21 1147 11/22/21 1507  AST 19 21 17   ALT 16 19 14   ALKPHOS 69 56 60  BILITOT 0.4 0.8 0.3  PROT 7.0 7.5 6.9  ALBUMIN 4.1 4.5 4.1   No results for input(s): "LIPASE", "AMYLASE" in the last 8760 hours. No results for input(s): "AMMONIA" in the last 8760 hours. CBC: Recent Labs    10/17/21 1206 11/15/21 1147 11/22/21 1507  WBC 4.6 3.8* 4.2  NEUTROABS 2.8 2.1 2.5  HGB 13.3 13.7 13.0  HCT 40.2 41.7 39.2  MCV 91.8 92.1 90.7  PLT 153 150 168   Lipid Panel: Recent Labs    09/13/21 1103  CHOL 140  HDL 69  LDLCALC 57  TRIG 68  CHOLHDL 2.0   TSH: No results for input(s): "TSH" in the last 8760 hours. A1C: No results found for: "HGBA1C"   Assessment/Plan 1. Cough, unspecified type *** - SARS-COV-2 RNA,(COVID-19) QUAL NAAT  2. Chills *** - SARS-COV-2 RNA,(COVID-19) QUAL NAAT  3. Nonintractable headache, unspecified chronicity pattern, unspecified headache type *** - SARS-COV-2 RNA,(COVID-19) QUAL NAAT   No follow-ups on file.: *** Kyera Felan K. [place], [facility] [phone]

## 2022-05-13 LAB — SARS-COV-2 RNA,(COVID-19) QUALITATIVE NAAT: SARS CoV2 RNA: NOT DETECTED

## 2022-05-17 ENCOUNTER — Other Ambulatory Visit: Payer: Self-pay | Admitting: Internal Medicine

## 2022-05-17 ENCOUNTER — Telehealth: Payer: Self-pay | Admitting: Medical Oncology

## 2022-05-17 ENCOUNTER — Other Ambulatory Visit: Payer: Self-pay | Admitting: Physician Assistant

## 2022-05-17 ENCOUNTER — Encounter: Payer: Self-pay | Admitting: Internal Medicine

## 2022-05-17 DIAGNOSIS — C3490 Malignant neoplasm of unspecified part of unspecified bronchus or lung: Secondary | ICD-10-CM

## 2022-05-17 DIAGNOSIS — C7B8 Other secondary neuroendocrine tumors: Secondary | ICD-10-CM

## 2022-05-17 DIAGNOSIS — Z85118 Personal history of other malignant neoplasm of bronchus and lung: Secondary | ICD-10-CM

## 2022-05-17 NOTE — Telephone Encounter (Signed)
R/S lab,scan and appt with Mohamed to next week due to URI and on antibiotics.  Pt given number to r/s scan.

## 2022-05-19 ENCOUNTER — Encounter (HOSPITAL_COMMUNITY): Payer: Self-pay

## 2022-05-19 ENCOUNTER — Inpatient Hospital Stay: Payer: Medicare HMO

## 2022-05-19 ENCOUNTER — Ambulatory Visit (HOSPITAL_COMMUNITY): Payer: Medicare HMO

## 2022-05-22 ENCOUNTER — Telehealth: Payer: Self-pay | Admitting: Internal Medicine

## 2022-05-23 ENCOUNTER — Ambulatory Visit: Payer: Medicare HMO | Admitting: Internal Medicine

## 2022-05-24 ENCOUNTER — Inpatient Hospital Stay: Payer: Medicare HMO | Admitting: Internal Medicine

## 2022-05-24 ENCOUNTER — Ambulatory Visit: Payer: Medicare HMO | Admitting: Internal Medicine

## 2022-05-25 ENCOUNTER — Inpatient Hospital Stay: Payer: Medicare HMO | Attending: Internal Medicine

## 2022-05-25 ENCOUNTER — Ambulatory Visit (HOSPITAL_COMMUNITY)
Admission: RE | Admit: 2022-05-25 | Discharge: 2022-05-25 | Disposition: A | Discharge status: Home / Self Care | Payer: Medicare HMO | Source: Ambulatory Visit | Attending: Physician Assistant | Admitting: Physician Assistant

## 2022-05-25 ENCOUNTER — Other Ambulatory Visit: Payer: Self-pay

## 2022-05-25 ENCOUNTER — Ambulatory Visit (HOSPITAL_COMMUNITY): Payer: Medicare HMO

## 2022-05-25 ENCOUNTER — Encounter (HOSPITAL_COMMUNITY): Payer: Self-pay

## 2022-05-25 DIAGNOSIS — C7B8 Other secondary neuroendocrine tumors: Secondary | ICD-10-CM

## 2022-05-25 DIAGNOSIS — K769 Liver disease, unspecified: Secondary | ICD-10-CM | POA: Diagnosis not present

## 2022-05-25 DIAGNOSIS — Z85118 Personal history of other malignant neoplasm of bronchus and lung: Secondary | ICD-10-CM

## 2022-05-25 DIAGNOSIS — J984 Other disorders of lung: Secondary | ICD-10-CM | POA: Diagnosis not present

## 2022-05-25 DIAGNOSIS — C787 Secondary malignant neoplasm of liver and intrahepatic bile duct: Secondary | ICD-10-CM | POA: Insufficient documentation

## 2022-05-25 DIAGNOSIS — K6389 Other specified diseases of intestine: Secondary | ICD-10-CM | POA: Diagnosis not present

## 2022-05-25 DIAGNOSIS — C7A8 Other malignant neuroendocrine tumors: Secondary | ICD-10-CM | POA: Insufficient documentation

## 2022-05-25 DIAGNOSIS — I700 Atherosclerosis of aorta: Secondary | ICD-10-CM | POA: Diagnosis not present

## 2022-05-25 DIAGNOSIS — C3490 Malignant neoplasm of unspecified part of unspecified bronchus or lung: Secondary | ICD-10-CM

## 2022-05-25 DIAGNOSIS — Z79899 Other long term (current) drug therapy: Secondary | ICD-10-CM | POA: Insufficient documentation

## 2022-05-25 DIAGNOSIS — I2510 Atherosclerotic heart disease of native coronary artery without angina pectoris: Secondary | ICD-10-CM | POA: Insufficient documentation

## 2022-05-25 DIAGNOSIS — C3411 Malignant neoplasm of upper lobe, right bronchus or lung: Secondary | ICD-10-CM | POA: Insufficient documentation

## 2022-05-25 LAB — CMP (CANCER CENTER ONLY)
ALT: 15 U/L (ref 0–44)
AST: 19 U/L (ref 15–41)
Albumin: 4.1 g/dL (ref 3.5–5.0)
Alkaline Phosphatase: 70 U/L (ref 38–126)
Anion gap: 4 — ABNORMAL LOW (ref 5–15)
BUN: 18 mg/dL (ref 8–23)
CO2: 31 mmol/L (ref 22–32)
Calcium: 9.7 mg/dL (ref 8.9–10.3)
Chloride: 104 mmol/L (ref 98–111)
Creatinine: 0.72 mg/dL (ref 0.44–1.00)
GFR, Estimated: >60 mL/min (ref >60)
Glucose, Bld: 76 mg/dL (ref 70–99)
Potassium: 4.3 mmol/L (ref 3.5–5.1)
Sodium: 139 mmol/L (ref 135–145)
Total Bilirubin: 0.3 mg/dL (ref 0.3–1.2)
Total Protein: 6.8 g/dL (ref 6.5–8.1)

## 2022-05-25 LAB — CBC WITH DIFFERENTIAL (CANCER CENTER ONLY)
Abs Immature Granulocytes: 0 10*3/uL (ref 0.00–0.07)
Basophils Absolute: 0 10*3/uL (ref 0.0–0.1)
Basophils Relative: 0 %
Eosinophils Absolute: 0.1 10*3/uL (ref 0.0–0.5)
Eosinophils Relative: 2 %
HCT: 38.9 % (ref 36.0–46.0)
Hemoglobin: 13.2 g/dL (ref 12.0–15.0)
Immature Granulocytes: 0 %
Lymphocytes Relative: 29 %
Lymphs Abs: 1.3 10*3/uL (ref 0.7–4.0)
MCH: 30.6 pg (ref 26.0–34.0)
MCHC: 33.9 g/dL (ref 30.0–36.0)
MCV: 90 fL (ref 80.0–100.0)
Monocytes Absolute: 0.3 10*3/uL (ref 0.1–1.0)
Monocytes Relative: 7 %
Neutro Abs: 2.9 10*3/uL (ref 1.7–7.7)
Neutrophils Relative %: 62 %
Platelet Count: 180 10*3/uL (ref 150–400)
RBC: 4.32 MIL/uL (ref 3.87–5.11)
RDW: 13.4 % (ref 11.5–15.5)
WBC Count: 4.7 10*3/uL (ref 4.0–10.5)
nRBC: 0 % (ref 0.0–0.2)

## 2022-05-25 MED ORDER — IOHEXOL 300 MG/ML  SOLN
100.0000 mL | Freq: Once | INTRAMUSCULAR | Status: AC | PRN
  Administered 2022-05-25: 100 mL via INTRAVENOUS

## 2022-05-25 MED ORDER — SODIUM CHLORIDE (PF) 0.9 % IJ SOLN
INTRAMUSCULAR | Status: AC
  Filled 2022-05-25: qty 50

## 2022-05-29 ENCOUNTER — Other Ambulatory Visit: Payer: Self-pay

## 2022-05-29 ENCOUNTER — Inpatient Hospital Stay: Payer: Medicare HMO | Attending: Internal Medicine | Admitting: Internal Medicine

## 2022-05-29 VITALS — BP 131/82 | HR 85 | Temp 97.6°F | Resp 18 | Ht 61.0 in | Wt 116.2 lb

## 2022-05-29 DIAGNOSIS — C7B8 Other secondary neuroendocrine tumors: Secondary | ICD-10-CM

## 2022-05-29 DIAGNOSIS — I700 Atherosclerosis of aorta: Secondary | ICD-10-CM | POA: Diagnosis not present

## 2022-05-29 DIAGNOSIS — C3411 Malignant neoplasm of upper lobe, right bronchus or lung: Secondary | ICD-10-CM | POA: Insufficient documentation

## 2022-05-29 DIAGNOSIS — J984 Other disorders of lung: Secondary | ICD-10-CM | POA: Insufficient documentation

## 2022-05-29 DIAGNOSIS — Z79899 Other long term (current) drug therapy: Secondary | ICD-10-CM | POA: Insufficient documentation

## 2022-05-29 DIAGNOSIS — Z902 Acquired absence of lung [part of]: Secondary | ICD-10-CM | POA: Insufficient documentation

## 2022-05-29 DIAGNOSIS — R5383 Other fatigue: Secondary | ICD-10-CM | POA: Insufficient documentation

## 2022-05-29 DIAGNOSIS — M47816 Spondylosis without myelopathy or radiculopathy, lumbar region: Secondary | ICD-10-CM | POA: Diagnosis not present

## 2022-05-29 DIAGNOSIS — K769 Liver disease, unspecified: Secondary | ICD-10-CM | POA: Insufficient documentation

## 2022-05-29 DIAGNOSIS — Z9049 Acquired absence of other specified parts of digestive tract: Secondary | ICD-10-CM | POA: Diagnosis not present

## 2022-05-29 DIAGNOSIS — I2510 Atherosclerotic heart disease of native coronary artery without angina pectoris: Secondary | ICD-10-CM | POA: Insufficient documentation

## 2022-05-29 DIAGNOSIS — C7A8 Other malignant neuroendocrine tumors: Secondary | ICD-10-CM

## 2022-05-29 DIAGNOSIS — I7121 Aneurysm of the ascending aorta, without rupture: Secondary | ICD-10-CM | POA: Insufficient documentation

## 2022-05-29 NOTE — Progress Notes (Signed)
[facility] [phone]   [phone]  OFFICE PROGRESS NOTE  [name], MD [place] [address]  DIAGNOSIS:  1) stage Ib (T2 a, N0, M0) non-small cell lung cancer, adenocarcinoma predominantly acinar with lipidic features diagnosed in August 2020. Molecular studies showed negative for EGFR mutation and negative PD-L1 expression. 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   PRIOR THERAPY:  Status post right upper lobectomy with lymph node dissection in [place]).   CURRENT THERAPY: Observation.  INTERVAL HISTORY: Jacqueline Hunt 88 y.o. female returns to the clinic today for follow-up visit.  The patient is feeling fine today with no concerning complaints.  She denied having any diarrhea or sweating.  She denied having any chest pain, shortness of breath, cough or hemoptysis.  She has no nausea, vomiting, diarrhea or constipation.  She has no weight loss or night sweats.  She has no headache or visual changes.  She denied having any fever or chills.  She was seen by Dr. Leamon Arnt at [facility] for second opinion regarding her well-differentiated neuroendocrine carcinoma and he recommended for the patient PET dotatate as well as consideration of treatment with octreotide but the patient did not want to be aggressive with her condition and she would like to wait on the treatment for now.  She is here today for evaluation with repeat CT scan of the chest, abdomen and pelvis for restaging of her disease.  MEDICAL HISTORY: Past Medical History:  Diagnosis Date   Arthritis    Brain tumor ([place])    Constipation due to opioid therapy    hospitalized d/t constipation   Dyspnea    High blood pressure    Lung cancer ([place])    Osteopenia    Osteoporosis    Parathyroid adenoma     ALLERGIES:  has No Known Allergies.  MEDICATIONS:  Current Outpatient  Medications  Medication Sig Dispense Refill   aspirin EC 81 MG tablet Take 81 mg by mouth daily.     Cholecalciferol (VITAMIN D3 PO) Take 500 mg by mouth daily.     Doxylamine Succinate, Sleep, (UNISOM PO) Take by mouth.     estradiol (CLIMARA - DOSED IN MG/24 HR) 0.05 mg/24hr patch Place 1 patch (0.05 mg total) onto the skin once a week. 12 patch 3   latanoprost (XALATAN) 0.005 % ophthalmic solution Place 1 drop into both eyes at bedtime.     lisinopril (ZESTRIL) 10 MG tablet Take 1 tablet (10 mg total) by mouth 2 (two) times daily. 180 tablet 1   Multiple Vitamins-Minerals (DAILY MULTIVITAMIN PO) Take 1 tablet by mouth daily.     rosuvastatin (CRESTOR) 5 MG tablet Take 1 tablet (5 mg total) by mouth every evening. 90 tablet 1   vitamin B-12 (CYANOCOBALAMIN) 500 MCG tablet Take 500 mcg by mouth daily.     doxycycline (VIBRA-TABS) 100 MG tablet Take 1 tablet (100 mg total) by mouth 2 (two) times daily. 14 tablet 0   No current facility-administered medications for this visit.    SURGICAL HISTORY:  Past Surgical History:  Procedure Laterality Date   brain meningioma excesion     CATARACT EXTRACTION     CESAREAN SECTION     GALLBLADDER SURGERY     gallbladder surgery      LOBECTOMY     TONSILLECTOMY     TONSILLECTOMY     WRIST FRACTURE SURGERY  REVIEW OF SYSTEMS:  Constitutional: positive for fatigue Eyes: negative Ears, nose, mouth, throat, and face: negative Respiratory: negative Cardiovascular: negative Gastrointestinal: negative Genitourinary:negative Integument/breast: negative Hematologic/lymphatic: negative Musculoskeletal:negative Neurological: negative Behavioral/Psych: negative Endocrine: negative Allergic/Immunologic: negative   PHYSICAL EXAMINATION: General appearance: alert, cooperative, and no distress Head: Normocephalic, without obvious abnormality, atraumatic Neck: no adenopathy, no JVD, supple, symmetrical, trachea midline, and thyroid not enlarged,  symmetric, no tenderness/mass/nodules Lymph nodes: Cervical, supraclavicular, and axillary nodes normal. Resp: clear to auscultation bilaterally Back: symmetric, no curvature. ROM normal. No CVA tenderness. Cardio: regular rate and rhythm, S1, S2 normal, no murmur, click, rub or gallop GI: soft, non-tender; bowel sounds normal; no masses,  no organomegaly Extremities: extremities normal, atraumatic, no cyanosis or edema Neurologic: Alert and oriented X 3, normal strength and tone. Normal symmetric reflexes. Normal coordination and gait  ECOG PERFORMANCE STATUS: 1 - Symptomatic but completely ambulatory  Blood pressure 131/82, pulse 85, temperature 97.6 F (36.4 C), temperature source Oral, resp. rate 18, height $RemoveBe'5\' 1"'TkzzLYaiO$  (1.549 m), weight 116 lb 3.2 oz (52.7 kg), SpO2 99 %.  LABORATORY DATA: Lab Results  Component Value Date   WBC 4.7 05/25/2022   HGB 13.2 05/25/2022   HCT 38.9 05/25/2022   MCV 90.0 05/25/2022   PLT 180 05/25/2022      Chemistry      Component Value Date/Time   NA 139 05/25/2022 1243   K 4.3 05/25/2022 1243   CL 104 05/25/2022 1243   CO2 31 05/25/2022 1243   BUN 18 05/25/2022 1243   BUN 20 03/12/2020 0000   CREATININE 0.72 05/25/2022 1243   CREATININE 0.67 08/30/2020 1512   GLU 179 03/12/2020 0000      Component Value Date/Time   CALCIUM 9.7 05/25/2022 1243   ALKPHOS 70 05/25/2022 1243   AST 19 05/25/2022 1243   ALT 15 05/25/2022 1243   BILITOT 0.3 05/25/2022 1243       RADIOGRAPHIC STUDIES: CT Chest W Contrast  Result Date: 05/26/2022 CLINICAL DATA:  Primary Cancer Type: Lung Imaging Indication: Active Surveillance Interval therapy since last imaging? No Initial Cancer Diagnosis Date: 06/2019; Established by: Biopsy-proven Detailed Pathology: Stage Ib non-small cell lung cancer, adenocarcinoma predominantly acinar with lipidic features. Primary Tumor location: Right upper lobe. Liver biopsy 11/15/2021; well-differentiated neuroendocrine carcinoma likely  of gastrointestinal primary. Surgeries: Right upper lobectomy 06/30/2019. Gallbladder surgery. Brain surgery 2003 for meningioma. Chemotherapy: No Immunotherapy? No Radiation therapy? No * Tracking Code: BO * EXAM: CT CHEST AND ABDOMEN WITH CONTRAST TECHNIQUE: Multidetector CT imaging of the chest and abdomen was performed following the standard protocol during bolus administration of intravenous contrast. RADIATION DOSE REDUCTION: This exam was performed according to the departmental dose-optimization program which includes automated exposure control, adjustment of the mA and/or kV according to patient size and/or use of iterative reconstruction technique. CONTRAST:  146mL OMNIPAQUE IOHEXOL 300 MG/ML  SOLN COMPARISON:  Most recent CT chest 10/17/2021. MRI abdomen 10/28/2021. FINDINGS: CT CHEST FINDINGS Cardiovascular: Stable ascending thoracic aortic aneurysm at 4.2 cm. Pectus excavatum with some mass effect upon the heart with similar appearance. No pericardial effusion or signs of pericardial nodularity. Post RIGHT upper lobectomy. Central pulmonary vasculature unremarkable accounting for postoperative changes. Mediastinum/Nodes: No signs of adenopathy in the chest. Esophagus is is unremarkable to the extent evaluated on CT. Lungs/Pleura: Post RIGHT upper lobectomy with scarring at the RIGHT lung apex. Posterior RIGHT lower lobe scarring. No effusion. No consolidative changes. Airways are patent. Posterior pleural nodularity measuring 8 mm associated with other areas  favored represent scarring, unchanged. Tracheal diverticulum in the RIGHT upper lobe amidst areas of scarring, stable. Musculoskeletal: Spinal degenerative changes. No acute bone findings or destructive bone process about the bony thorax. CT ABDOMEN FINDINGS Hepatobiliary: Hyper vascular foci in the liver compatible with known neuroendocrine tumor. (Image 10/8) 11 mm lesion at the dome of the RIGHT hemiliver is stable. (Image 20/8) 24 mm RIGHT  posterior hepatic lobe lesion is stable. (Image 33/8) RIGHT hepatic lobe lesion posterior to porta hepatis 14 mm, also stable. Post cholecystectomy with biliary duct distension similar to previous imaging. Lesions in the inferior RIGHT hemiliver are similar compared to prior MR imaging. There are no new lesions with a single lesion in the lateral segment of the LEFT hepatic lobe (image 27/8). Pancreas: Normal, without mass, inflammation or ductal dilatation. Spleen: Normal. Adrenals/Urinary Tract: Adrenal glands are normal. Symmetric renal enhancement. No hydronephrosis or perinephric stranding. No suspicious renal lesion. Stomach/Bowel: Gastrointestinal tract without acute process to the extent evaluated on this abdominal CT portion of this evaluation. Vascular/Lymphatic: Aortic atherosclerosis without aneurysmal dilation of the abdominal aorta. Smooth contour of the IVC. Enhancing lymph node in the small bowel mesentery (image 67/8) 11 mm. Indistinct appearance of mesenteric structures just below this on image 72/8. On image 33/11 there is a mesenteric mass that is partially visualized measuring at least 18 mm in the central abdomen. Patent abdominal vasculature to the extent evaluated. Other: No ascites.  No peritoneal nodularity. Musculoskeletal: Degenerative changes throughout the lumbar spine. IMPRESSION: Post treatment changes in the chest related to therapy of prior lung cancer. Stable nodular area along the posterior RIGHT chest more likely related to scarring and unchanged since November of 2021 along with a stable 4 mm LEFT upper lobe pulmonary nodule. Attention on follow-up. Signs of metastatic disease to the liver, now known to represent neuroendocrine carcinoma "favored GI" primary without change. Nodal disease in the mesentery and partially imaged mesenteric mass on today's study. Assessment of more remote imaging from 2020 shows nodal enlargement in the central small bowel mesentery and a small bowel  mass in the distal small bowel. No current signs of obstruction or acute process with respect to imaged portions of the GI tract in the abdomen, pelvis not imaged on today's evaluation. If imaging of this area would change management further assessment of the abdomen and pelvis could be considered, potentially helpful to determine whether the patient may be at risk for developing obstruction due to the small bowel primary. 4.1 cm ascending thoracic aortic dilation. Suggest attention on follow-up. Aortic atherosclerosis and coronary artery disease. Insert PRA call report Electronically Signed   By: Zetta Bills M.D.   On: 05/26/2022 11:48   CT Abdomen W Contrast  Result Date: 05/26/2022 CLINICAL DATA:  Primary Cancer Type: Lung Imaging Indication: Active Surveillance Interval therapy since last imaging? No Initial Cancer Diagnosis Date: 06/2019; Established by: Biopsy-proven Detailed Pathology: Stage Ib non-small cell lung cancer, adenocarcinoma predominantly acinar with lipidic features. Primary Tumor location: Right upper lobe. 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 Surgeries: Right upper lobectomy 06/30/2019. Gallbladder surgery. Brain surgery 2003 for meningioma. Chemotherapy: No Immunotherapy? No Radiation therapy? No * Tracking Code: BO * EXAM: CT CHEST AND ABDOMEN WITH CONTRAST TECHNIQUE: Multidetector CT imaging of the chest and abdomen was performed following the standard protocol during bolus administration of intravenous contrast. RADIATION DOSE REDUCTION: This exam was performed according to the departmental dose-optimization program which includes automated exposure control, adjustment of the mA and/or kV  according to patient size and/or use of iterative reconstruction technique. CONTRAST:  182mL OMNIPAQUE IOHEXOL 300 MG/ML  SOLN COMPARISON:  Most recent CT chest 10/17/2021. MRI abdomen 10/28/2021. FINDINGS: CT CHEST FINDINGS  Cardiovascular: Stable ascending thoracic aortic aneurysm at 4.2 cm. Pectus excavatum with some mass effect upon the heart with similar appearance. No pericardial effusion or signs of pericardial nodularity. Post RIGHT upper lobectomy. Central pulmonary vasculature unremarkable accounting for postoperative changes. Mediastinum/Nodes: No signs of adenopathy in the chest. Esophagus is is unremarkable to the extent evaluated on CT. Lungs/Pleura: Post RIGHT upper lobectomy with scarring at the RIGHT lung apex. Posterior RIGHT lower lobe scarring. No effusion. No consolidative changes. Airways are patent. Posterior pleural nodularity measuring 8 mm associated with other areas favored represent scarring, unchanged. Tracheal diverticulum in the RIGHT upper lobe amidst areas of scarring, stable. Musculoskeletal: Spinal degenerative changes. No acute bone findings or destructive bone process about the bony thorax. CT ABDOMEN FINDINGS Hepatobiliary: Hyper vascular foci in the liver compatible with known neuroendocrine tumor. (Image 10/8) 11 mm lesion at the dome of the RIGHT hemiliver is stable. (Image 20/8) 24 mm RIGHT posterior hepatic lobe lesion is stable. (Image 33/8) RIGHT hepatic lobe lesion posterior to porta hepatis 14 mm, also stable. Post cholecystectomy with biliary duct distension similar to previous imaging. Lesions in the inferior RIGHT hemiliver are similar compared to prior MR imaging. There are no new lesions with a single lesion in the lateral segment of the LEFT hepatic lobe (image 27/8). Pancreas: Normal, without mass, inflammation or ductal dilatation. Spleen: Normal. Adrenals/Urinary Tract: Adrenal glands are normal. Symmetric renal enhancement. No hydronephrosis or perinephric stranding. No suspicious renal lesion. Stomach/Bowel: Gastrointestinal tract without acute process to the extent evaluated on this abdominal CT portion of this evaluation. Vascular/Lymphatic: Aortic atherosclerosis without  aneurysmal dilation of the abdominal aorta. Smooth contour of the IVC. Enhancing lymph node in the small bowel mesentery (image 67/8) 11 mm. Indistinct appearance of mesenteric structures just below this on image 72/8. On image 33/11 there is a mesenteric mass that is partially visualized measuring at least 18 mm in the central abdomen. Patent abdominal vasculature to the extent evaluated. Other: No ascites.  No peritoneal nodularity. Musculoskeletal: Degenerative changes throughout the lumbar spine. IMPRESSION: Post treatment changes in the chest related to therapy of prior lung cancer. Stable nodular area along the posterior RIGHT chest more likely related to scarring and unchanged since November of 2021 along with a stable 4 mm LEFT upper lobe pulmonary nodule. Attention on follow-up. Signs of metastatic disease to the liver, now known to represent neuroendocrine carcinoma "favored GI" primary without change. Nodal disease in the mesentery and partially imaged mesenteric mass on today's study. Assessment of more remote imaging from 2020 shows nodal enlargement in the central small bowel mesentery and a small bowel mass in the distal small bowel. No current signs of obstruction or acute process with respect to imaged portions of the GI tract in the abdomen, pelvis not imaged on today's evaluation. If imaging of this area would change management further assessment of the abdomen and pelvis could be considered, potentially helpful to determine whether the patient may be at risk for developing obstruction due to the small bowel primary. 4.1 cm ascending thoracic aortic dilation. Suggest attention on follow-up. Aortic atherosclerosis and coronary artery disease. Insert PRA call report Electronically Signed   By: Zetta Bills M.D.   On: 05/26/2022 11:48     ASSESSMENT AND PLAN: This is a very pleasant 88 years  old white female diagnosed with stage Ib (T2 a, N0, M0) non-small cell lung cancer, adenocarcinoma  predominantly acinar with lipidic features diagnosed in August 2020 status post right upper lobectomy with lymph node dissection in [place]). Molecular studies showed negative for EGFR mutation and negative PD-L1 expression. The patient is currently on observation and she is feeling fine today with no concerning complaints. Her recent scan of the chest showed no concerning findings for disease recurrence or progression in the chest but there was suspicious liver lesion that was seen on previous PET scan when she was in [place] and they were not hypermetabolic but still concerning.  I recommended for the patient to have MRI of the liver for evaluation of this lesion and to rule out metastatic disease. I ordered MRI of the liver that was performed recently and unfortunately showed concerning findings for liver metastasis. The patient underwent ultrasound-guided core biopsy of one of the liver lesion and the final pathology was consistent with well-differentiated neuroendocrine carcinoma of gastrointestinal primary. The patient is currently on observation.  She was seen by second opinion by Dr. Leamon Arnt at [facility] and he recommended for the patient to consider PET Dotatate and treatment with octreotide after the scan. She had repeat CT scan of the chest, abdomen and pelvis performed recently.  I personally and independently reviewed the scan and discussed the result with the patient today. Her scan showed a stable disease.  The patient is currently asymptomatic and she would like to continue on observation but she would also consider proceeding with the PET Dotatate for further evaluation of her disease. I will see her back for follow-up visit in 6 months with repeat blood work as well as a PET scan. If she changes her mind regarding treatment, I will be happy to see her sooner. She was advised to call immediately if she has any other concerning symptoms in the  interval. The patient voices understanding of current disease status and treatment options and is in agreement with the current care plan.  All questions were answered. The patient knows to call the clinic with any problems, questions or concerns. We can certainly see the patient much sooner if necessary.  The total time spent in the appointment was 30 minutes.  Disclaimer: This note was dictated with voice recognition software. Similar sounding words can inadvertently be transcribed and may not be corrected upon review.

## 2022-06-15 ENCOUNTER — Telehealth: Payer: Self-pay | Admitting: Internal Medicine

## 2022-06-15 NOTE — Telephone Encounter (Signed)
Called patient regarding upcoming December appointments, patient is notified. 

## 2022-06-20 DIAGNOSIS — H401131 Primary open-angle glaucoma, bilateral, mild stage: Secondary | ICD-10-CM | POA: Diagnosis not present

## 2022-07-11 ENCOUNTER — Other Ambulatory Visit: Payer: Self-pay | Admitting: *Deleted

## 2022-07-11 NOTE — Progress Notes (Signed)
error 

## 2022-08-14 ENCOUNTER — Ambulatory Visit (INDEPENDENT_AMBULATORY_CARE_PROVIDER_SITE_OTHER): Payer: Medicare HMO | Admitting: Ophthalmology

## 2022-08-14 ENCOUNTER — Encounter (INDEPENDENT_AMBULATORY_CARE_PROVIDER_SITE_OTHER): Payer: Self-pay

## 2022-08-14 ENCOUNTER — Encounter (INDEPENDENT_AMBULATORY_CARE_PROVIDER_SITE_OTHER): Payer: Medicare HMO | Admitting: Ophthalmology

## 2022-08-14 ENCOUNTER — Encounter (INDEPENDENT_AMBULATORY_CARE_PROVIDER_SITE_OTHER): Payer: Self-pay | Admitting: Ophthalmology

## 2022-08-14 DIAGNOSIS — H348122 Central retinal vein occlusion, left eye, stable: Secondary | ICD-10-CM | POA: Diagnosis not present

## 2022-08-14 DIAGNOSIS — H34812 Central retinal vein occlusion, left eye, with macular edema: Secondary | ICD-10-CM | POA: Diagnosis not present

## 2022-08-14 NOTE — Progress Notes (Signed)
08/14/2022     CHIEF COMPLAINT Patient presents for  Chief Complaint  Patient presents with   Retina Evaluation      HISTORY OF PRESENT ILLNESS: Jacqueline Hunt is a 88 y.o. female who presents to the clinic today for:   HPI     Retina Evaluation           Laterality: left eye   Associated Symptoms: Flashes and Distortion.  Negative for Floaters, Blind Spot, Pain, Redness, Photophobia, Glare, Trauma, Scalp Tenderness, Jaw Claudication, Shoulder/Hip pain, Fever, Weight Loss and Fatigue         Comments   NP- CRVO OS POSSIBLE RETINAL DETACHMENT OS- REF BY [facility]. "When I woke up, I noticed blurry vision in my left eye. I have trouble reading. I notice my perception is off when walking. I notice a couple of nights ago, I looked at the ceiling at notices flashing light- it looked like twinkling lights." Review of symptoms revealed: FOL and denies floaters. Pt is currently taking Latanoprost- 1 drop into both eyes at bedtime.         Last edited by Silvestre Moment on 08/14/2022  3:44 PM.      Referring physician: Rutherford Guys, [address] [place],  [place] [address]  HISTORICAL INFORMATION:   Selected notes from the MEDICAL RECORD NUMBER       CURRENT MEDICATIONS: Current Outpatient Medications (Ophthalmic Drugs)  Medication Sig   latanoprost (XALATAN) 0.005 % ophthalmic solution Place 1 drop into both eyes at bedtime.   No current facility-administered medications for this visit. (Ophthalmic Drugs)   Current Outpatient Medications (Other)  Medication Sig   aspirin EC 81 MG tablet Take 81 mg by mouth daily.   Cholecalciferol (VITAMIN D3 PO) Take 500 mg by mouth daily.   doxycycline (VIBRA-TABS) 100 MG tablet Take 1 tablet (100 mg total) by mouth 2 (two) times daily.   Doxylamine Succinate, Sleep, (UNISOM PO) Take by mouth.   estradiol (CLIMARA - DOSED IN MG/24 HR) 0.05 mg/24hr patch Place 1 patch (0.05 mg total) onto the skin once a week.   lisinopril  (ZESTRIL) 10 MG tablet Take 1 tablet (10 mg total) by mouth 2 (two) times daily.   Multiple Vitamins-Minerals (DAILY MULTIVITAMIN PO) Take 1 tablet by mouth daily.   rosuvastatin (CRESTOR) 5 MG tablet Take 1 tablet (5 mg total) by mouth every evening.   vitamin B-12 (CYANOCOBALAMIN) 500 MCG tablet Take 500 mcg by mouth daily.   No current facility-administered medications for this visit. (Other)      REVIEW OF SYSTEMS: ROS   Negative for: Constitutional, Gastrointestinal, Neurological, Skin, Genitourinary, Musculoskeletal, HENT, Endocrine, Cardiovascular, Eyes, Respiratory, Psychiatric, Allergic/Imm, Heme/Lymph Last edited by Silvestre Moment on 08/14/2022  3:46 PM.       ALLERGIES No Known Allergies  PAST MEDICAL HISTORY Past Medical History:  Diagnosis Date   Arthritis    Brain tumor ([place])    Constipation due to opioid therapy    hospitalized d/t constipation   Dyspnea    High blood pressure    Lung cancer ([place])    Osteopenia    Osteoporosis    Parathyroid adenoma    Past Surgical History:  Procedure Laterality Date   brain meningioma excesion     CATARACT EXTRACTION     CESAREAN SECTION     GALLBLADDER SURGERY     gallbladder surgery      LOBECTOMY     TONSILLECTOMY     TONSILLECTOMY  WRIST FRACTURE SURGERY      FAMILY HISTORY History reviewed. No pertinent family history.  SOCIAL HISTORY Social History   Tobacco Use   Smoking status: Former    Packs/day: 1.00    Years: 57.10    Total pack years: 57.10    Types: Cigarettes    Quit date: 1964    Years since quitting: 59.7   Smokeless tobacco: Never  Vaping Use   Vaping Use: Never used  Substance Use Topics   Alcohol use: Never   Drug use: Never         OPHTHALMIC EXAM:  Base Eye Exam     Visual Acuity (ETDRS)       Right Left   Dist cc 20/50 CF at 3'   Dist ph cc 20/30 -2     Correction: Glasses         Tonometry (Tonopen, 3:53 PM)       Right Left   Pressure 17 17          Pupils       Pupils APD   Right PERRL None   Left PERRL None         Visual Fields       Left Right    Full Full         Extraocular Movement       Right Left    Full, Ortho Full, Ortho         Neuro/Psych     Oriented x3: Yes         Dilation     Both eyes: 1.0% Mydriacyl @ 3:53 PM           Slit Lamp and Fundus Exam     External Exam       Right Left   External Normal Normal         Slit Lamp Exam       Right Left   Lids/Lashes Normal Normal   Conjunctiva/Sclera White and quiet White and quiet   Cornea Clear Clear   Anterior Chamber Deep and quiet Deep and quiet   Iris Round and reactive Round and reactive   Lens Centered posterior chamber intraocular lens Centered posterior chamber intraocular lens   Anterior Vitreous Normal Normal         Fundus Exam       Right Left   Posterior Vitreous Normal Normal   Disc Normal Normal   C/D Ratio 0.35 0.35   Macula Normal Intraretinal hemorrhage   Vessels Normal Central retinal vein occlusion diffuse.  With intraretinal hemorrhage diffuse   Periphery Normal Normal            IMAGING AND PROCEDURES  Imaging and Procedures for 08/15/22  OCT, Retina - OU - Both Eyes       Right Eye Quality was good. Scan locations included subfoveal. Central Foveal Thickness: 282. Progression has no prior data. Findings include normal foveal contour.   Left Eye Quality was good. Scan locations included subfoveal. Central Foveal Thickness: 366. Progression has no prior data. Findings include abnormal foveal contour.   Notes OS, with intraretinal hemorrhages diffusely central retinal vein occlusion     Color Fundus Photography Optos - OU - Both Eyes       Right Eye Progression has no prior data. Disc findings include increased cup to disc ratio, pallor. Macula : normal observations. Vessels : normal observations. Periphery : normal observations.   Left Eye Progression has no prior data.  Disc  findings include pallor. Macula : microaneurysms, hemorrhage. Vessels : tortuous vessels.   Notes Intraretinal hemorrhage left eye with CME OS from central retinal vein occlusion.  Will need to commence therapy with antivegF promptly today     Intravitreal Injection, Pharmacologic Agent - OS - Left Eye       Time Out 08/14/2022. 4:34 PM. Confirmed correct patient, procedure, site, and patient consented.   Anesthesia Topical anesthesia was used. Anesthetic medications included Lidocaine 4%.   Procedure Preparation included 5% betadine to ocular surface, 10% betadine to eyelids. A 30 gauge needle was used.   Injection: 2.5 mg Bevacizumab 1.25mg /0.71ml   Route: Intravitreal, Site: Left Eye   NDC: H061816, Lot: [id number]   Post-op Post injection exam found visual acuity of at least counting fingers. The patient tolerated the procedure well. There were no complications. The patient received written and verbal post procedure care education. Post injection medications included ocuflox.              ASSESSMENT/PLAN:  Central retinal vein occlusion with macular edema of left eye The nature of central retinal vein occlusion was discussed with the patient including the division of types into nonischemic ischemic. The potential sequelae of ischemic central retinal vein occlusion, including macular edema, neovascularization, rubeosis iridis, and neovascular glaucoma, were discussed, and the need for frequent follow-up.  The nature of macular edema and central retinal vein occlusion was discussed. The following options were considered:  1.Observation for a period to look for spontaneous improvement, is no linger the primary therapy. One-third worsen, one-third stay unchanged, and one-third improves.  2. Anti-VEGF Therapy. ( Lucentis, Avastin or Eylea ) injected  in intravitreal fashion, initially monthly then tailored to clinical response.  3. Intravitreal steroid usage, Kenalog, or  Ozurdex, usually a second line therapy or in combination with anti-Vegf therapy noted above.  4. Panretinal laser photocoagulation to cause regression of iris neovascularization, or treat retinal  non-perfusion.  5. Surgical Management may include vitrectomy with incisions of peripheral veins to trigger retino choroidal anastomosis formation. This topic presented and discussed at [place].   OS with commence with therapy intravitreal Avastin today     ICD-10-CM   1. Central retinal vein occlusion with macular edema of left eye  H34.8120 OCT, Retina - OU - Both Eyes    Color Fundus Photography Optos - OU - Both Eyes    Intravitreal Injection, Pharmacologic Agent - OS - Left Eye    Bevacizumab (AVASTIN) SOLN 2.5 mg      1.  OS with central retinal vein occlusion with secondary macular edema.  Severe.  Accounts for acuity.  Commence with therapy today.  Risk benefits reviewed of treatment.  2.  Intravitreal Avastin OS today  3.  Ophthalmic Meds Ordered this visit:  Meds ordered this encounter  Medications   Bevacizumab (AVASTIN) SOLN 2.5 mg       Return in about 6 weeks (around 09/25/2022) for dilate, OS, AVASTIN OCT.  There are no Patient Instructions on file for this visit.   Explained the diagnoses, plan, and follow up with the patient and they expressed understanding.  Patient expressed understanding of the importance of proper follow up care.   Clent Demark Kumar Falwell M.D. Diseases & Surgery of the Retina and Vitreous [facility] [place] 08/15/22     Abbreviations: M myopia (nearsighted); A astigmatism; H hyperopia (farsighted); P presbyopia; Mrx spectacle prescription;  CTL contact lenses; OD right eye; OS left eye; OU both eyes  XT exotropia; ET esotropia; PEK punctate epithelial keratitis; PEE punctate epithelial erosions; DES dry eye syndrome; MGD meibomian gland dysfunction; ATs artificial tears; PFAT's preservative free artificial tears; [place] nuclear sclerotic  cataract; PSC posterior subcapsular cataract; ERM epi-retinal membrane; PVD posterior vitreous detachment; RD retinal detachment; DM diabetes mellitus; DR diabetic retinopathy; NPDR non-proliferative diabetic retinopathy; PDR proliferative diabetic retinopathy; CSME clinically significant macular edema; DME diabetic macular edema; dbh dot blot hemorrhages; CWS cotton wool spot; POAG primary open angle glaucoma; C/D cup-to-disc ratio; HVF humphrey visual field; GVF goldmann visual field; OCT optical coherence tomography; IOP intraocular pressure; BRVO Branch retinal vein occlusion; CRVO central retinal vein occlusion; CRAO central retinal artery occlusion; BRAO branch retinal artery occlusion; RT retinal tear; SB scleral buckle; PPV pars plana vitrectomy; VH Vitreous hemorrhage; PRP panretinal laser photocoagulation; IVK intravitreal kenalog; VMT vitreomacular traction; MH Macular hole;  NVD neovascularization of the disc; NVE neovascularization elsewhere; AREDS age related eye disease study; ARMD age related macular degeneration; POAG primary open angle glaucoma; EBMD epithelial/anterior basement membrane dystrophy; ACIOL anterior chamber intraocular lens; IOL intraocular lens; PCIOL posterior chamber intraocular lens; Phaco/IOL phacoemulsification with intraocular lens placement; [place] photorefractive keratectomy; LASIK laser assisted in situ keratomileusis; HTN hypertension; DM diabetes mellitus; COPD chronic obstructive pulmonary disease

## 2022-08-14 NOTE — Assessment & Plan Note (Signed)
The nature of central retinal vein occlusion was discussed with the patient including the division of types into nonischemic ischemic. The potential sequelae of ischemic central retinal vein occlusion, including macular edema, neovascularization, rubeosis iridis, and neovascular glaucoma, were discussed, and the need for frequent follow-up.  The nature of macular edema and central retinal vein occlusion was discussed. The following options were considered:  1.Observation for a period to look for spontaneous improvement, is no linger the primary therapy. One-third worsen, one-third stay unchanged, and one-third improves.  2. Anti-VEGF Therapy. ( Lucentis, Avastin or Eylea ) injected  in intravitreal fashion, initially monthly then tailored to clinical response.  3. Intravitreal steroid usage, Kenalog, or Ozurdex, usually a second line therapy or in combination with anti-Vegf therapy noted above.  4. Panretinal laser photocoagulation to cause regression of iris neovascularization, or treat retinal  non-perfusion.  5. Surgical Management may include vitrectomy with incisions of peripheral veins to trigger retino choroidal anastomosis formation. This topic presented and discussed at [place].   OS with commence with therapy intravitreal Avastin today

## 2022-08-15 DIAGNOSIS — H34812 Central retinal vein occlusion, left eye, with macular edema: Secondary | ICD-10-CM | POA: Diagnosis not present

## 2022-08-15 MED ORDER — BEVACIZUMAB CHEMO INJECTION 1.25MG/0.05ML SYRINGE FOR KALEIDOSCOPE
2.5000 mg | INTRAVITREAL | Status: AC | PRN
  Administered 2022-08-15: 2.5 mg via INTRAVITREAL

## 2022-08-15 NOTE — Addendum Note (Signed)
Addended by: Deloria Lair A on: 08/15/2022 06:35 PM   Modules accepted: Orders

## 2022-08-21 ENCOUNTER — Encounter: Payer: Self-pay | Admitting: Family

## 2022-08-21 ENCOUNTER — Telehealth: Payer: Self-pay

## 2022-08-21 ENCOUNTER — Ambulatory Visit (INDEPENDENT_AMBULATORY_CARE_PROVIDER_SITE_OTHER): Payer: Medicare HMO | Admitting: Family

## 2022-08-21 DIAGNOSIS — Z0000 Encounter for general adult medical examination without abnormal findings: Secondary | ICD-10-CM

## 2022-08-21 NOTE — Progress Notes (Signed)
This service is provided via telemedicine  No vital signs collected/recorded due to the encounter was a telemedicine visit.   Location of patient (ex: home, work):  home  Patient consents to a telephone visit:  yes  Location of the provider (ex: office, home):  [facility]  Names of all persons participating in the telemedicine service and their role in the encounter:  patient, [name] CCMA,CMAA, Marlow Baars, NP  Time spent on call:  50minutes   [name],you are scheduled for a virtual visit with your provider today.    Just as we do with appointments in the office, we must obtain your consent to participate.  Your consent will be active for this visit and any virtual visit you may have with one of our providers in the next 365 days.    If you have a MyChart account, I can also send a copy of this consent to you electronically.  All virtual visits are billed to your insurance company just like a traditional visit in the office.  As this is a virtual visit, video technology does not allow for your provider to perform a traditional examination.  This may limit your provider's ability to fully assess your condition.  If your provider identifies any concerns that need to be evaluated in person or the need to arrange testing such as labs, EKG, etc, we will make arrangements to do so.    Although advances in technology are sophisticated, we cannot ensure that it will always work on either your end or our end.  If the connection with a video visit is poor, we may have to switch to a telephone visit.  With either a video or telephone visit, we are not always able to ensure that we have a secure connection.   I need to obtain your verbal consent now.   Are you willing to proceed with your visit today?   [name] has provided verbal consent on 08/21/2022 for a virtual visit (video or telephone).   [name], NP 08/21/2022  4:21  PM      Subjective:   Jacqueline Hunt is a 88 y.o. female who presents for Medicare Annual (Subsequent) preventive examination.  Review of Systems     Cardiac Risk Factors include: advanced age (>48men, >42 women);hypertension;smoking/ tobacco exposure;dyslipidemia     Objective:    Today's Vitals   08/21/22 1607  PainSc: 2    There is no height or weight on file to calculate BMI.     08/21/2022    3:11 PM 05/12/2022    2:34 PM 11/15/2021   11:58 AM 08/17/2021   10:33 AM 12/02/2020   10:16 AM 09/16/2020    2:25 PM 08/30/2020    1:31 PM  Advanced Directives  Does Patient Have a Medical Advance Directive? Yes Yes Yes Yes Yes Yes Yes  Type of Paramedic of [place];Living will;Out of facility DNR (pink MOST or yellow form) [place];Living will;Out of facility DNR (pink MOST or yellow form) [place];Living will [place];Living will [place];Out of facility DNR (pink MOST or yellow form) Out of facility DNR (pink MOST or yellow form);Healthcare Power of [employer] of facility DNR (pink MOST or yellow form);Healthcare Power of Attorney  Does patient want to make changes to medical advance directive? No - Patient declined No - Patient declined No - Patient declined No - Patient declined No - Patient  declined No - Patient declined No - Patient declined  Copy of [place] in Chart? No - copy requested No - copy requested No - copy requested No - copy requested Yes - validated most recent copy scanned in chart (See row information)  No - copy requested    Current Medications (verified) Outpatient Encounter Medications as of 08/21/2022  Medication Sig   aspirin EC 81 MG tablet Take 81 mg by mouth daily.   Cholecalciferol (VITAMIN D3 PO) Take 500 mg by mouth daily.   Doxylamine Succinate, Sleep, (UNISOM PO) Take by mouth.   estradiol (CLIMARA - DOSED IN MG/24 HR) 0.05  mg/24hr patch Place 1 patch (0.05 mg total) onto the skin once a week.   latanoprost (XALATAN) 0.005 % ophthalmic solution Place 1 drop into both eyes at bedtime.   lisinopril (ZESTRIL) 10 MG tablet Take 1 tablet (10 mg total) by mouth 2 (two) times daily.   Multiple Vitamins-Minerals (DAILY MULTIVITAMIN PO) Take 1 tablet by mouth daily.   rosuvastatin (CRESTOR) 5 MG tablet Take 1 tablet (5 mg total) by mouth every evening.   vitamin B-12 (CYANOCOBALAMIN) 500 MCG tablet Take 500 mcg by mouth daily.   [DISCONTINUED] doxycycline (VIBRA-TABS) 100 MG tablet Take 1 tablet (100 mg total) by mouth 2 (two) times daily.   [DISCONTINUED] MODERNA COVID-19 BIVALENT 50 MCG/0.5ML injection    No facility-administered encounter medications on file as of 08/21/2022.    Allergies (verified) Patient has no known allergies.   History: Past Medical History:  Diagnosis Date   Arthritis    Brain tumor ([place])    Constipation due to opioid therapy    hospitalized d/t constipation   Dyspnea    High blood pressure    Lung cancer ([place])    Osteopenia    Osteoporosis    Parathyroid adenoma    Past Surgical History:  Procedure Laterality Date   brain meningioma excesion     CATARACT EXTRACTION     CESAREAN SECTION     GALLBLADDER SURGERY     gallbladder surgery      LOBECTOMY     TONSILLECTOMY     TONSILLECTOMY     WRIST FRACTURE SURGERY     History reviewed. No pertinent family history. Social History   Socioeconomic History   Marital status: Divorced    Spouse name: Not on file   Number of children: Not on file   Years of education: Not on file   Highest education level: Not on file  Occupational History   Not on file  Tobacco Use   Smoking status: Former    Packs/day: 1.00    Years: 57.10    Total pack years: 57.10    Types: Cigarettes    Quit date: 1964    Years since quitting: 59.7   Smokeless tobacco: Never  Vaping Use   Vaping Use: Never used  Substance and Sexual Activity    Alcohol use: Never   Drug use: Never   Sexual activity: Not Currently  Other Topics Concern   Not on file  Social History Narrative   Diet: Nothing Special      Do you drink/ eat things with caffeine?  Coffe      Marital status:   Divorced                            What year were you married ? 1951      Do  you live in a house, apartment,assistred living, condo, trailer, etc.)? Apartment      Is it one or more stories? First Floor      How many persons live in your home ? One      Do you have any pets in your home ?(please list) Cat      Highest Level of education completed: PhD      Current or past profession:  Former Scientist, physiological + Professor , Social Work      Do you exercise?      Walk                        Type & how often  Just Power Walk Daily      ADVANCED DIRECTIVES (Please bring copies)      Do you have a living will? Yes      Do you have a DNR form?    Yes                   If not, do you want to discuss one?       Do you have signed POA?HPOA forms?  Yes               If so, please bring to your appointment      FUNCTIONAL STATUS- To be completed by Spouse / child / Staff       Do you have difficulty bathing or dressing yourself ? No      Do you have difficulty preparing food or eating ? No      Do you have difficulty managing your mediation ? No      Do you have difficulty managing your finances ? No      Do you have difficulty affording your medication ? No      Social Determinants of Radio broadcast assistant Strain: Not on file  Food Insecurity: Not on file  Transportation Needs: Not on file  Physical Activity: Not on file  Stress: Not on file  Social Connections: Not on file    Tobacco Counseling Counseling given: Not Answered   Clinical Intake:  Pre-visit preparation completed: No  Pain : 0-10 Pain Score: 2  Pain Type: Chronic pain Pain Location: Neck (shoulder) Pain Orientation: Right, Left Pain Radiating Towards: No Pain Descriptors  / Indicators: Aching Pain Onset: Other (comment) (several years) Pain Frequency: Intermittent Pain Relieving Factors: OTC tylenol Effect of Pain on Daily Activities: No  Pain Relieving Factors: OTC tylenol  BMI - recorded: 21.84 Nutritional Status: BMI of 19-24  Normal Nutritional Risks: None Diabetes: No  How often do you need to have someone help you when you read instructions, pamphlets, or other written materials from your doctor or pharmacy?: 1 - Never What is the last grade level you completed in school?: Doctorate Degreee in Education officer, museum  Diabetic?NO   Interpreter Needed?: No      Activities of Daily Living    08/21/2022    4:16 PM 11/15/2021   11:55 AM  In your present state of health, do you have any difficulty performing the following activities:  Hearing? 0 1  Comment  Pt left hearing aids at home  Vision? 1 0  Comment followsup with Dr.Shapiro   Difficulty concentrating or making decisions? 0 0  Walking or climbing stairs? 0 1  Comment  long distances only  Dressing or bathing? 0 0  Doing errands, shopping? 0   Preparing  Food and eating ? N   Using the Toilet? N   Comment occassional constipation stool softerner effective and broccoli   In the past six months, have you accidently leaked urine? N   Do you have problems with loss of bowel control? N   Managing your Medications? N   Managing your Finances? N   Housekeeping or managing your Housekeeping? Y   Comment Has an assistance     Patient Care Team: [name], MD as PCP - General (Family Medicine) Harlene Ramus (Podiatry)  Indicate any recent Medical Services you may have received from other than [facility] providers in the past year (date may be approximate).     Assessment:   This is a routine wellness examination for [place].  Hearing/Vision screen Hearing Screening   125Hz   Right ear Pass  Left ear Pass    Dietary issues and exercise activities discussed: Current Exercise  Habits: Home exercise routine, Time (Minutes): 30, Frequency (Times/Week): 3, Weekly Exercise (Minutes/Week): 90, Intensity: Moderate   Goals Addressed             This Visit's Progress    Patient Stated   On track    I would like to do exercises daily to keep her gait steady        Depression Screen    08/17/2021   10:27 AM 12/02/2020   10:15 AM 08/30/2020    1:29 PM  PHQ 2/9 Scores  PHQ - 2 Score 0 0 0    Fall Risk    08/21/2022    3:16 PM 05/12/2022    2:34 PM 09/13/2021   10:36 AM 08/17/2021   10:29 AM 12/02/2020   10:15 AM  [employer] in the past year? 0 0 0 0 0  Number falls in past yr: 0 0 0 0 0  Injury with Fall? 0 0 0 0 0  Risk for fall due to : No Fall Risks No Fall Risks History of fall(s) No Fall Risks   Follow up Falls evaluation completed Falls evaluation completed Falls evaluation completed;Education provided;Falls prevention discussed Falls evaluation completed     FALL RISK PREVENTION PERTAINING TO THE HOME:  Any stairs in or around the home? No  If so, are there any without handrails? No  Home free of loose throw rugs in walkways, pet beds, electrical cords, etc? No  Adequate lighting in your home to reduce risk of falls? Yes   ASSISTIVE DEVICES UTILIZED TO PREVENT FALLS:  Life alert? yes Use of a cane, walker or w/c? Yes  Grab bars in the bathroom? Yes  Shower chair or bench in shower? Yes  Elevated toilet seat or a handicapped toilet? No   TIMED UP AND GO:  Was the test performed? No .  Length of time to ambulate 10 feet: N/A sec.   Gait slow and steady without use of assistive device  Cognitive Function:        08/21/2022    3:16 PM 08/17/2021   10:34 AM  6CIT Screen  What Year? 0 points 0 points  What month? 0 points 0 points  What time? 0 points 0 points  Count back from 20 0 points 0 points  Months in reverse 0 points 0 points  Repeat phrase 0 points 0 points  Total Score 0 points 0 points     Immunizations Immunization History  Administered Date(s) Administered   Hepatitis A, Adult 01/09/2018, 07/09/2018   Influenza Split 09/07/2011, 10/29/2012,  08/26/2014   Influenza, High Dose Seasonal PF 08/26/2014, 09/24/2017, 08/14/2021   Influenza-Unspecified 09/21/2009, 10/28/2019, 08/31/2020   Moderna Sars-Covid-2 Vaccination 04/01/2022   PFIZER Comirnaty(Gray Top)Covid-19 Tri-Sucrose Vaccine 03/08/2021, 08/14/2021   PFIZER(Purple Top)SARS-COV-2 Vaccination 12/16/2019, 01/05/2020, 08/17/2020, 03/08/2021   Pneumococcal Conjugate-13 02/25/2014   Pneumococcal Polysaccharide-23 01/15/2007   Td,absorbed, Preservative Free, Adult Use, Lf Unspecified 11/29/2017   Tdap 11/29/2017   Zoster Recombinat (Shingrix) 03/26/2017, 06/24/2017   Zoster, Live 11/06/2011    TDAP status: Up to date  Flu Vaccine status: Due, Education has been provided regarding the importance of this vaccine. Advised may receive this vaccine at local pharmacy or Health Dept. Aware to provide a copy of the vaccination record if obtained from local pharmacy or Health Dept. Verbalized acceptance and understanding.  Pneumococcal vaccine status: Up to date  Covid-19 vaccine status: Information provided on how to obtain vaccines.   Qualifies for Shingles Vaccine? Yes   Zostavax completed Yes   Shingrix Completed?: Yes  Screening Tests Health Maintenance  Topic Date Due   COVID-19 Vaccine (8 - Pfizer risk series) 05/27/2022   INFLUENZA VACCINE  06/27/2022   TETANUS/TDAP  11/30/2027   Pneumonia Vaccine 42+ Years old  Completed   DEXA SCAN  Completed   Zoster Vaccines- Shingrix  Completed   HPV VACCINES  Aged Out    Health Maintenance  Health Maintenance Due  Topic Date Due   COVID-19 Vaccine (8 - Pfizer risk series) 05/27/2022   INFLUENZA VACCINE  06/27/2022    Colorectal cancer screening: No longer required.   Mammogram status: No longer required due to Advance age .  Bone Density status: Completed  04/28/2021. Results reflect: Bone density results: OSTEOPOROSIS. Repeat every 2 years.  Lung Cancer Screening: (Low Dose CT Chest recommended if Age 18-80 years, 30 pack-year currently smoking OR have quit w/in 15years.) does not qualify.   Lung Cancer Screening Referral: No   Additional Screening:  Hepatitis C Screening: does qualify; Completed Yes   Vision Screening: Recommended annual ophthalmology exams for early detection of glaucoma and other disorders of the eye. Is the patient up to date with their annual eye exam?  Yes  Who is the provider or what is the name of the office in which the patient attends annual eye exams? Dr. Gershon Crane  If pt is not established with a provider, would they like to be referred to a provider to establish care? No .   Dental Screening: Recommended annual dental exams for proper oral hygiene  Community Resource Referral / Chronic Care Management: CRR required this visit?  No   CCM required this visit?  No      Plan:     I have personally reviewed and noted the following in the patient's chart:   Medical and social history Use of alcohol, tobacco or illicit drugs  Current medications and supplements including opioid prescriptions. Patient is not currently taking opioid prescriptions. Functional ability and status Nutritional status Physical activity Advanced directives List of other physicians Hospitalizations, surgeries, and ER visits in previous 12 months Vitals Screenings to include cognitive, depression, and falls Referrals and appointments  In addition, I have reviewed and discussed with patient certain preventive protocols, quality metrics, and best practice recommendations. A written personalized care plan for preventive services as well as general preventive health recommendations were provided to patient.     [name], NP   08/21/2022   Nurse Notes:Advised to get COVID-19 vaccine and Influenza vaccine at the Pharmacy

## 2022-08-21 NOTE — Telephone Encounter (Signed)
Opened in error

## 2022-08-21 NOTE — Telephone Encounter (Signed)
This service is provided via telemedicine  No vital signs collected/recorded due to the encounter was a telemedicine visit.   Location of patient (ex: home, work):  work  Patient consents to a telephone visit:  yes  Location of the provider (ex: office, home):  [facility]  Names of all persons participating in the telemedicine service and their role in the encounter:  patient, [name] CCMA,CMAA, Marlowe Sax, NP   Time spent on call:  10 minutes

## 2022-08-21 NOTE — Patient Instructions (Signed)
Jacqueline Hunt , Thank you for taking time to come for your Medicare Wellness Visit. I appreciate your ongoing commitment to your health goals. Please review the following plan we discussed and let me know if I can assist you in the future.   Screening recommendations/referrals: Colonoscopy : N/A  Mammogram N/A Bone Density : Up to date  Recommended yearly ophthalmology/optometry visit for glaucoma screening and checkup Recommended yearly dental visit for hygiene and checkup  Vaccinations: Influenza vaccine- due annually in September/October Pneumococcal vaccine : Up to date  Tdap vaccine : Up to date  Shingles vaccine : Up to date     Advanced directives: Yes   Conditions/risks identified: Cardiac Risk Factors include: advanced age (>38men, >73 women);hypertension;smoking/ tobacco exposure;dyslipidemia  Next appointment: 1 year    Preventive Care 63 Years and Older, Female Preventive care refers to lifestyle choices and visits with your health care provider that can promote health and wellness. What does preventive care include? A yearly physical exam. This is also called an annual well check. Dental exams once or twice a year. Routine eye exams. Ask your health care provider how often you should have your eyes checked. Personal lifestyle choices, including: Daily care of your teeth and gums. Regular physical activity. Eating a healthy diet. Avoiding tobacco and drug use. Limiting alcohol use. Practicing safe sex. Taking low-dose aspirin every day. Taking vitamin and mineral supplements as recommended by your health care provider. What happens during an annual well check? The services and screenings done by your health care provider during your annual well check will depend on your age, overall health, lifestyle risk factors, and family history of disease. Counseling  Your health care provider may ask you questions about your: Alcohol use. Tobacco use. Drug use. Emotional  well-being. Home and relationship well-being. Sexual activity. Eating habits. History of falls. Memory and ability to understand (cognition). Work and work Statistician. Reproductive health. Screening  You may have the following tests or measurements: Height, weight, and BMI. Blood pressure. Lipid and cholesterol levels. These may be checked every 5 years, or more frequently if you are over 70 years old. Skin check. Lung cancer screening. You may have this screening every year starting at age 94 if you have a 30-pack-year history of smoking and currently smoke or have quit within the past 15 years. Fecal occult blood test (FOBT) of the stool. You may have this test every year starting at age 45. Flexible sigmoidoscopy or colonoscopy. You may have a sigmoidoscopy every 5 years or a colonoscopy every 10 years starting at age 67. Hepatitis C blood test. Hepatitis B blood test. Sexually transmitted disease (STD) testing. Diabetes screening. This is done by checking your blood sugar (glucose) after you have not eaten for a while (fasting). You may have this done every 1-3 years. Bone density scan. This is done to screen for osteoporosis. You may have this done starting at age 59. Mammogram. This may be done every 1-2 years. Talk to your health care provider about how often you should have regular mammograms. Talk with your health care provider about your test results, treatment options, and if necessary, the need for more tests. Vaccines  Your health care provider may recommend certain vaccines, such as: Influenza vaccine. This is recommended every year. Tetanus, diphtheria, and acellular pertussis (Tdap, Td) vaccine. You may need a Td booster every 10 years. Zoster vaccine. You may need this after age 39. Pneumococcal 13-valent conjugate (PCV13) vaccine. One dose is recommended after age 59.  Pneumococcal polysaccharide (PPSV23) vaccine. One dose is recommended after age 10. Talk to your  health care provider about which screenings and vaccines you need and how often you need them. This information is not intended to replace advice given to you by your health care provider. Make sure you discuss any questions you have with your health care provider. Document Released: 12/10/2015 Document Revised: 08/02/2016 Document Reviewed: 09/14/2015 Elsevier Interactive Patient Education  2017 [place] Prevention in the Home Falls can cause injuries. They can happen to people of all ages. There are many things you can do to make your home safe and to help prevent falls. What can I do on the outside of my home? Regularly fix the edges of walkways and driveways and fix any cracks. Remove anything that might make you trip as you walk through a door, such as a raised step or threshold. Trim any bushes or trees on the path to your home. Use bright outdoor lighting. Clear any walking paths of anything that might make someone trip, such as rocks or tools. Regularly check to see if handrails are loose or broken. Make sure that both sides of any steps have handrails. Any raised decks and porches should have guardrails on the edges. Have any leaves, snow, or ice cleared regularly. Use sand or salt on walking paths during winter. Clean up any spills in your garage right away. This includes oil or grease spills. What can I do in the bathroom? Use night lights. Install grab bars by the toilet and in the tub and shower. Do not use towel bars as grab bars. Use non-skid mats or decals in the tub or shower. If you need to sit down in the shower, use a plastic, non-slip stool. Keep the floor dry. Clean up any water that spills on the floor as soon as it happens. Remove soap buildup in the tub or shower regularly. Attach bath mats securely with double-sided non-slip rug tape. Do not have throw rugs and other things on the floor that can make you trip. What can I do in the bedroom? Use night  lights. Make sure that you have a light by your bed that is easy to reach. Do not use any sheets or blankets that are too big for your bed. They should not hang down onto the floor. Have a firm chair that has side arms. You can use this for support while you get dressed. Do not have throw rugs and other things on the floor that can make you trip. What can I do in the kitchen? Clean up any spills right away. Avoid walking on wet floors. Keep items that you use a lot in easy-to-reach places. If you need to reach something above you, use a strong step stool that has a grab bar. Keep electrical cords out of the way. Do not use floor polish or wax that makes floors slippery. If you must use wax, use non-skid floor wax. Do not have throw rugs and other things on the floor that can make you trip. What can I do with my stairs? Do not leave any items on the stairs. Make sure that there are handrails on both sides of the stairs and use them. Fix handrails that are broken or loose. Make sure that handrails are as long as the stairways. Check any carpeting to make sure that it is firmly attached to the stairs. Fix any carpet that is loose or worn. Avoid having throw rugs at the  top or bottom of the stairs. If you do have throw rugs, attach them to the floor with carpet tape. Make sure that you have a light switch at the top of the stairs and the bottom of the stairs. If you do not have them, ask someone to add them for you. What else can I do to help prevent falls? Wear shoes that: Do not have high heels. Have rubber bottoms. Are comfortable and fit you well. Are closed at the toe. Do not wear sandals. If you use a stepladder: Make sure that it is fully opened. Do not climb a closed stepladder. Make sure that both sides of the stepladder are locked into place. Ask someone to hold it for you, if possible. Clearly mark and make sure that you can see: Any grab bars or handrails. First and last  steps. Where the edge of each step is. Use tools that help you move around (mobility aids) if they are needed. These include: Canes. Walkers. Scooters. Crutches. Turn on the lights when you go into a dark area. Replace any light bulbs as soon as they burn out. Set up your furniture so you have a clear path. Avoid moving your furniture around. If any of your floors are uneven, fix them. If there are any pets around you, be aware of where they are. Review your medicines with your doctor. Some medicines can make you feel dizzy. This can increase your chance of falling. Ask your doctor what other things that you can do to help prevent falls. This information is not intended to replace advice given to you by your health care provider. Make sure you discuss any questions you have with your health care provider. Document Released: 09/09/2009 Document Revised: 04/20/2016 Document Reviewed: 12/18/2014 Elsevier Interactive Patient Education  2017 [employer].

## 2022-08-23 IMAGING — CT CT CHEST W/ CM
2 of 4 series · 15 of 36 positions shown, 18 images · IV contrast (omnipaque)
Comparison: 10/13/2020

CLINICAL DATA: Non-small cell lung cancer restaging, status post
right upper lobectomy

EXAM:
CT CHEST WITH CONTRAST
TECHNIQUE: Multidetector CT imaging of the chest was performed during
intravenous contrast administration.
CONTRAST:  75mL OMNIPAQUE IOHEXOL 300 MG/ML  SOLN

[Series 2: axial st · axial · 0.70mm/px · z∈[+472,+734]mm · 12 of 153 slices shown, 15 images]
[im 11/153  mediastinal]
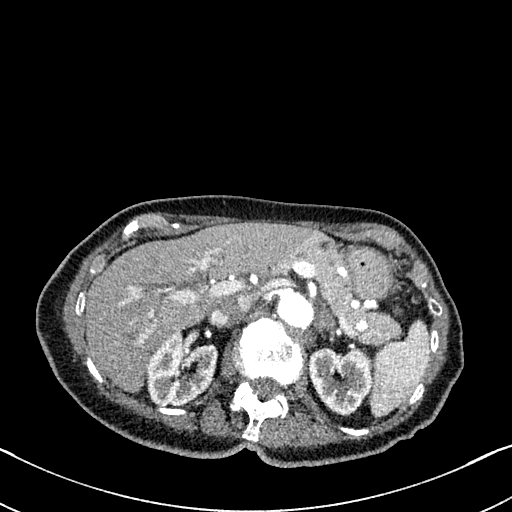
[im 11/153  lung]
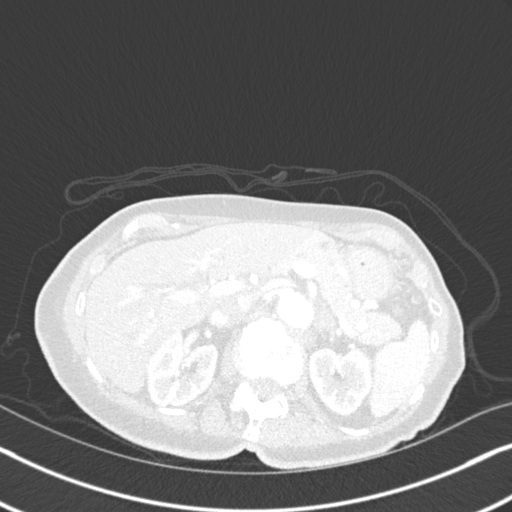
[im 22/153  lung]
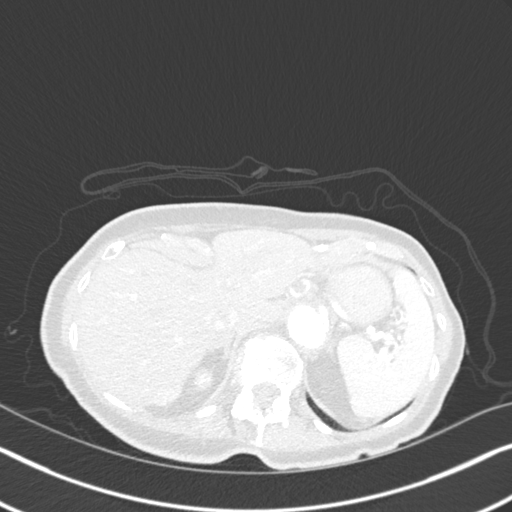
[im 33/153  lung]
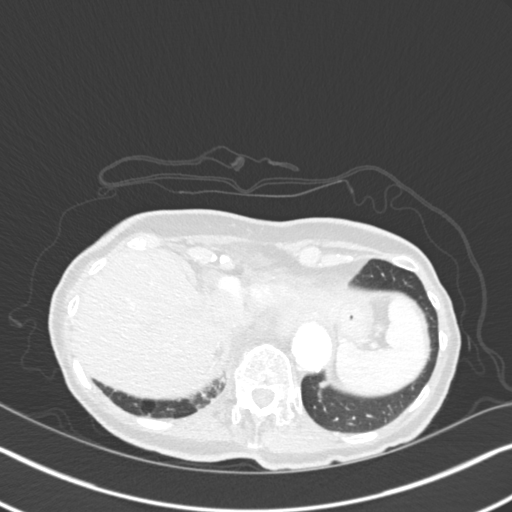
[im 44/153  lung]
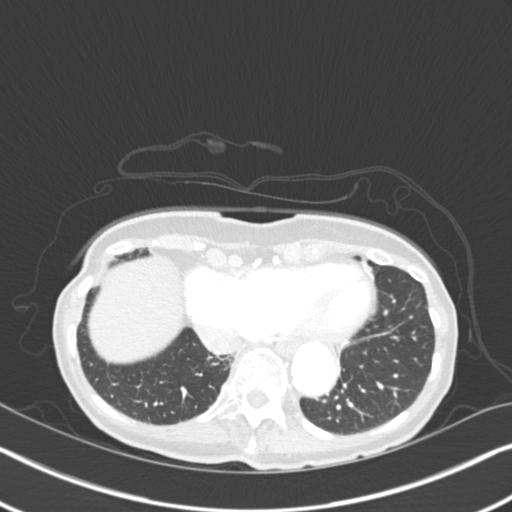
[im 55/153  mediastinal]
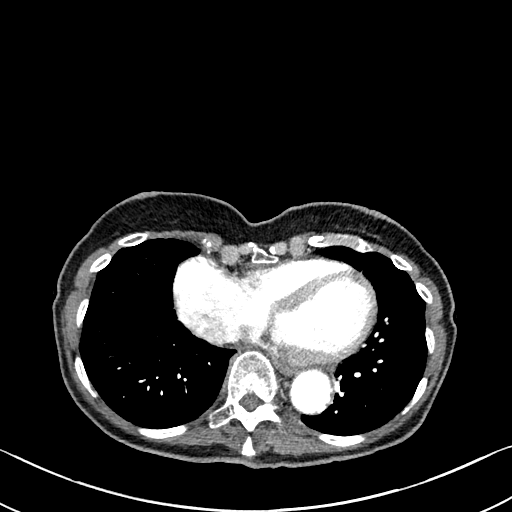
[im 55/153  lung]
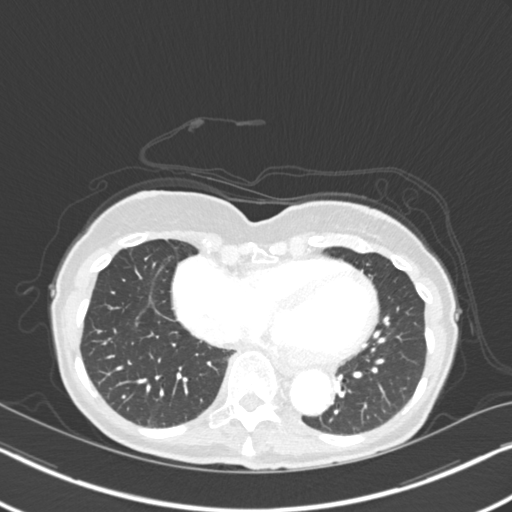
[im 66/153  lung]
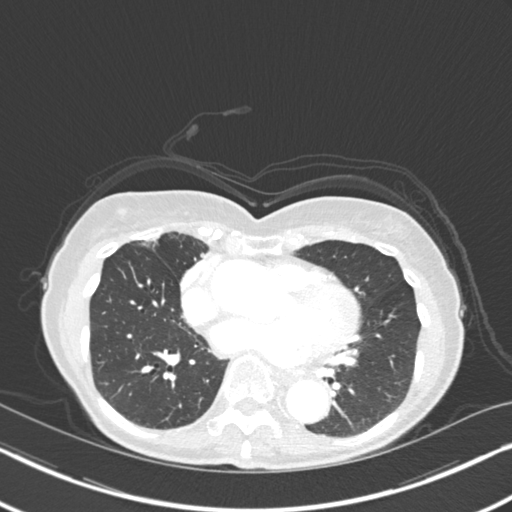
[im 87/153  lung]
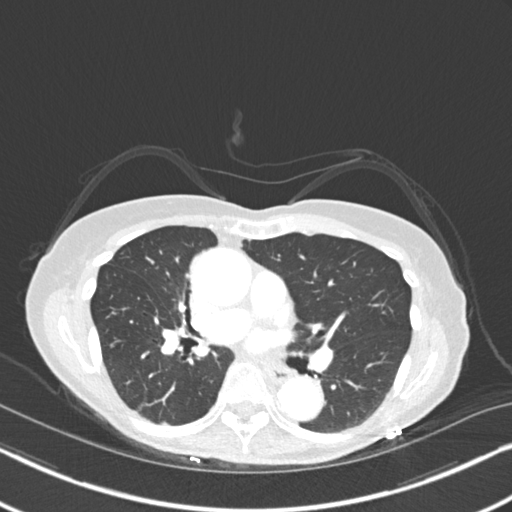
[im 98/153  lung]
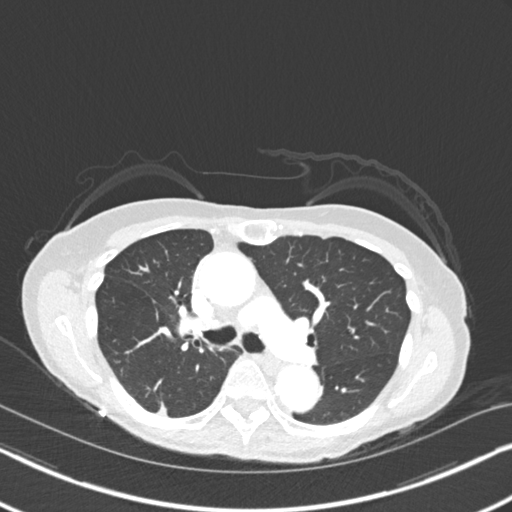
[im 109/153  mediastinal]
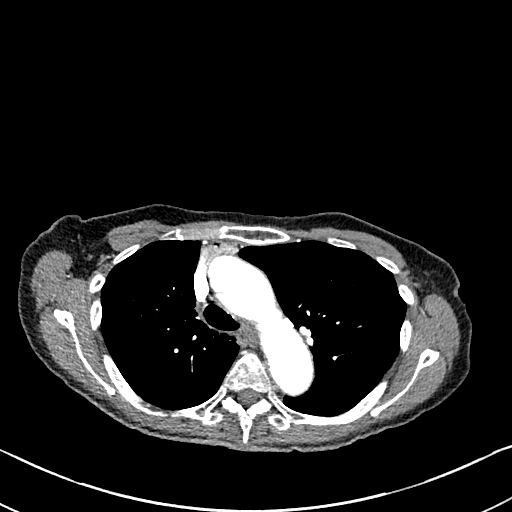
[im 109/153  lung]
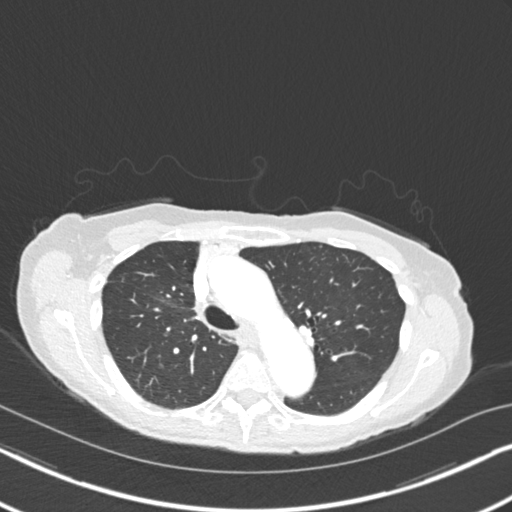
[im 120/153  lung]
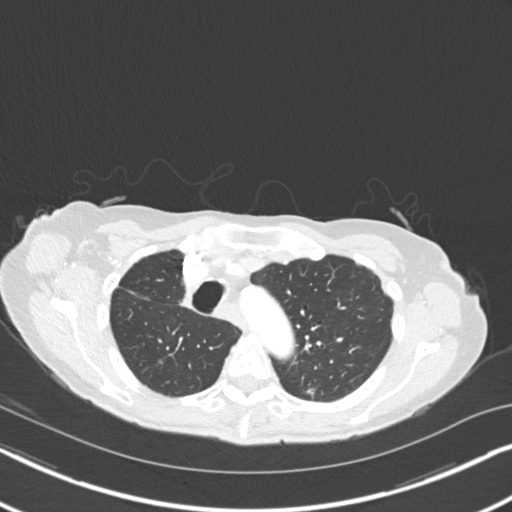
[im 131/153  lung]
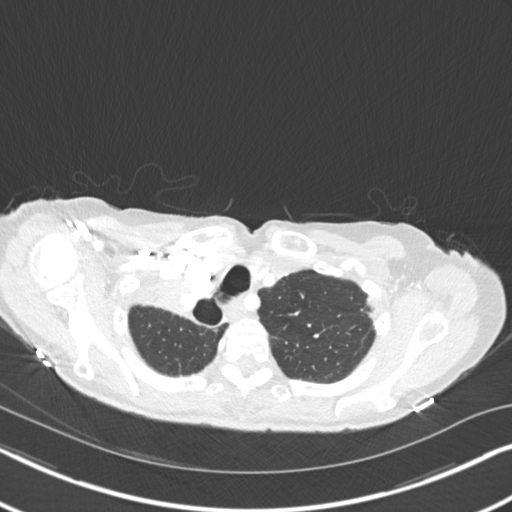
[im 142/153  lung]
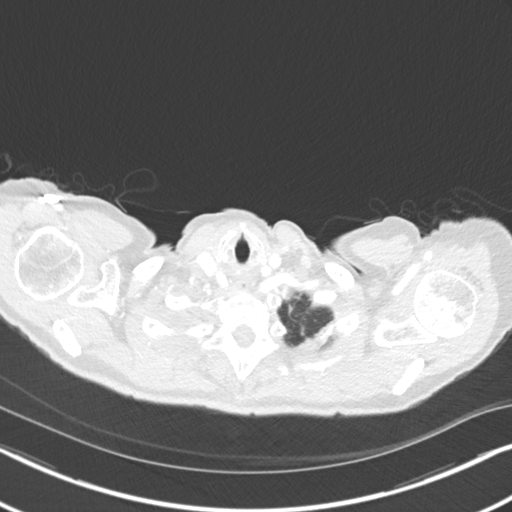

[Series 5: coronal · coronal · 0.67mm/px · 3 of 92 slices shown]
[im 19/92  lung]
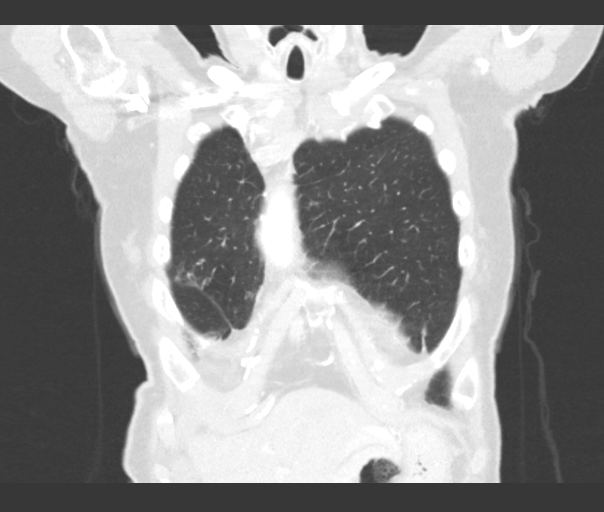
[im 37/92  lung]
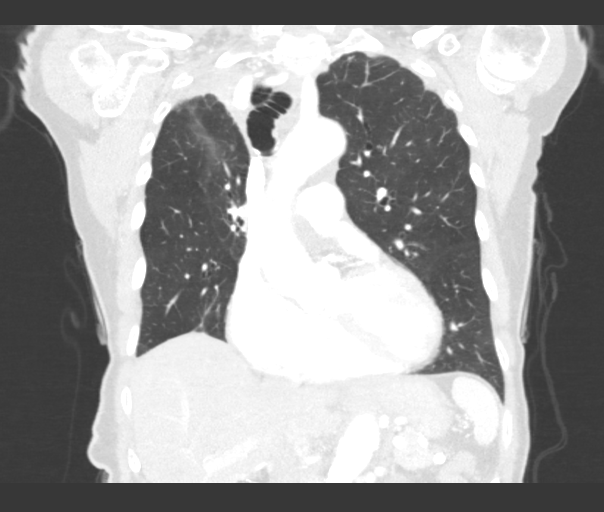
[im 55/92  lung]
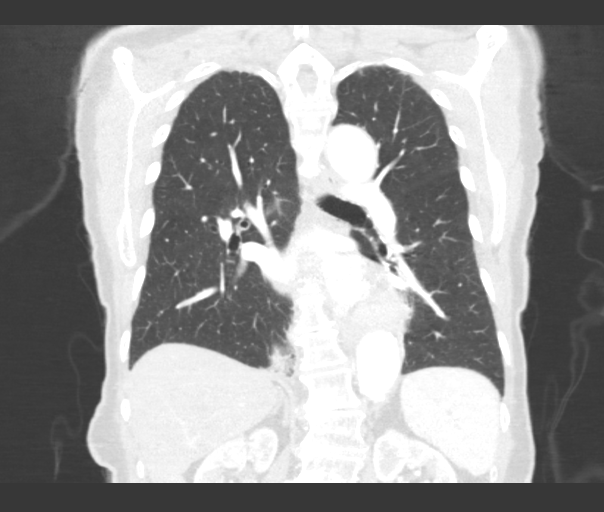

[15 of 36 positions shown; findings below may reference images not displayed]

FINDINGS: Cardiovascular: Aortic atherosclerosis. Unchanged enlargement of the
tubular ascending thoracic aorta, measuring 4.2 x 4.2 cm. Mild
cardiomegaly. Three-vessel coronary artery calcifications. No
pericardial effusion.

Mediastinum/Nodes: No enlarged mediastinal, hilar, or axillary lymph
nodes. Unchanged right-sided tracheal diverticulum (series 7, image
17). Thyroid gland and esophagus demonstrate no significant
findings.

Lungs/Pleura: Redemonstrated postoperative findings of right upper
lobectomy. Occasional small pulmonary nodules are unchanged, for
example a 5 mm nodule of the posterior left pulmonary apex (series
7, image 27). Redemonstrated bronchial plugging and small
consolidations in the right middle lobe (series 7, image 80). No
pleural effusion or pneumothorax.

Upper Abdomen: No acute abnormality.

Musculoskeletal: No chest wall mass or suspicious bone lesions
identified. Pectus deformity.
IMPRESSION: 1. Redemonstrated postoperative findings of right upper lobectomy.
No evidence of malignant recurrence or metastatic disease in the
chest.
2. Occasional small pulmonary nodules measuring 5 mm and smaller,
unchanged. These remain nonspecific but most likely infectious or
inflammatory. Attention on follow-up.
3. Redemonstrated bronchial plugging and small consolidations in the
right middle lobe, consistent with atypical infection, particularly
atypical mycobacterium.
4. Unchanged enlargement of the tubular ascending thoracic aorta,
measuring 4.2 x 4.2 cm. Recommend annual imaging followup by CTA or
MRA. This recommendation follows 2636
ACCF/AHA/AATS/ACR/ASA/SCA/ECONTENT/TANATA/SHIMPEI/JONATAN EMANUEL Guidelines for the
Diagnosis and Management of Patients with Thoracic Aortic Disease.
Circulation. 2636; 121: [id number]. Aortic aneurysm NOS (LSK3O-2VA.2)
5. Coronary artery disease.

Aortic Atherosclerosis (LSK3O-XTD.D).

## 2022-09-22 ENCOUNTER — Encounter: Payer: Self-pay | Admitting: Internal Medicine

## 2022-09-25 ENCOUNTER — Encounter: Payer: Self-pay | Admitting: Medical Oncology

## 2022-09-25 ENCOUNTER — Encounter (INDEPENDENT_AMBULATORY_CARE_PROVIDER_SITE_OTHER): Payer: Self-pay

## 2022-09-25 ENCOUNTER — Encounter (INDEPENDENT_AMBULATORY_CARE_PROVIDER_SITE_OTHER): Payer: Medicare HMO | Admitting: Ophthalmology

## 2022-09-25 DIAGNOSIS — H35352 Cystoid macular degeneration, left eye: Secondary | ICD-10-CM | POA: Diagnosis not present

## 2022-09-25 DIAGNOSIS — H34812 Central retinal vein occlusion, left eye, with macular edema: Secondary | ICD-10-CM | POA: Diagnosis not present

## 2022-09-25 DIAGNOSIS — H43812 Vitreous degeneration, left eye: Secondary | ICD-10-CM | POA: Diagnosis not present

## 2022-09-25 DIAGNOSIS — H3562 Retinal hemorrhage, left eye: Secondary | ICD-10-CM | POA: Diagnosis not present

## 2022-09-28 ENCOUNTER — Telehealth: Payer: Self-pay | Admitting: Medical Oncology

## 2022-09-28 ENCOUNTER — Other Ambulatory Visit: Payer: Self-pay | Admitting: Internal Medicine

## 2022-09-28 DIAGNOSIS — Z9889 Other specified postprocedural states: Secondary | ICD-10-CM

## 2022-09-28 NOTE — Telephone Encounter (Signed)
"  why does my chart say "Malignant neoplasm of brain"?

## 2022-09-29 NOTE — Telephone Encounter (Signed)
-----   Message from [place], PA-C sent at 09/28/2022  4:01 PM EDT ----- I don't know where that came from. It somehow got linked to her aspirin on 02/01/21 in her snapshot but not from our office. There is no office visit from that time when this diagnosis code got added. We did not add that diagnosis code. I don't even see any brain MRIs in the system. I cannot explain where that came from.  ----- Message ----- From: Ardeen Garland, RN Sent: 09/28/2022   3:55 PM EDT To: Curt Bears, MD; #  I saw this dx ( malignant neoplasm of the brain) on MyChart for the first time and am concerned about the Dx.   Can you or someone on your team respond to me about it?  Jacqueline Hunt

## 2022-10-12 DIAGNOSIS — H401131 Primary open-angle glaucoma, bilateral, mild stage: Secondary | ICD-10-CM | POA: Diagnosis not present

## 2022-10-22 ENCOUNTER — Other Ambulatory Visit: Payer: Self-pay | Admitting: Family Medicine

## 2022-10-30 DIAGNOSIS — H3562 Retinal hemorrhage, left eye: Secondary | ICD-10-CM | POA: Diagnosis not present

## 2022-10-30 DIAGNOSIS — H34812 Central retinal vein occlusion, left eye, with macular edema: Secondary | ICD-10-CM | POA: Diagnosis not present

## 2022-10-30 DIAGNOSIS — H35352 Cystoid macular degeneration, left eye: Secondary | ICD-10-CM | POA: Diagnosis not present

## 2022-10-30 DIAGNOSIS — H34232 Retinal artery branch occlusion, left eye: Secondary | ICD-10-CM | POA: Diagnosis not present

## 2022-10-30 DIAGNOSIS — H43812 Vitreous degeneration, left eye: Secondary | ICD-10-CM | POA: Diagnosis not present

## 2022-11-01 DIAGNOSIS — Z0100 Encounter for examination of eyes and vision without abnormal findings: Secondary | ICD-10-CM | POA: Diagnosis not present

## 2022-11-10 ENCOUNTER — Other Ambulatory Visit: Payer: Self-pay

## 2022-11-10 MED ORDER — ESTRADIOL 0.05 MG/24HR TD PTWK: 0.0500 mg | MEDICATED_PATCH | TRANSDERMAL | 3 refills | Status: DC

## 2022-11-10 NOTE — Telephone Encounter (Signed)
Patient has request refill on medication Estradiol 0.05mg . Patient medication last refilled 12/15/2021. Patient medication pend and sent to Marlowe Sax, NP due to PCP Sabra Heck Lillette Boxer, MD being out of office.

## 2022-11-17 NOTE — Telephone Encounter (Signed)
Patient medication refilled 11/10/2022.

## 2022-11-28 ENCOUNTER — Other Ambulatory Visit: Payer: Self-pay | Admitting: *Deleted

## 2022-11-28 ENCOUNTER — Inpatient Hospital Stay: Payer: Medicare HMO | Attending: Internal Medicine

## 2022-11-28 DIAGNOSIS — D72819 Decreased white blood cell count, unspecified: Secondary | ICD-10-CM | POA: Insufficient documentation

## 2022-11-28 DIAGNOSIS — K769 Liver disease, unspecified: Secondary | ICD-10-CM | POA: Insufficient documentation

## 2022-11-28 DIAGNOSIS — C7A8 Other malignant neuroendocrine tumors: Secondary | ICD-10-CM | POA: Insufficient documentation

## 2022-11-28 DIAGNOSIS — Z902 Acquired absence of lung [part of]: Secondary | ICD-10-CM | POA: Diagnosis not present

## 2022-11-28 DIAGNOSIS — R5383 Other fatigue: Secondary | ICD-10-CM | POA: Insufficient documentation

## 2022-11-28 DIAGNOSIS — D696 Thrombocytopenia, unspecified: Secondary | ICD-10-CM | POA: Diagnosis not present

## 2022-11-28 DIAGNOSIS — M542 Cervicalgia: Secondary | ICD-10-CM | POA: Insufficient documentation

## 2022-11-28 DIAGNOSIS — Z85118 Personal history of other malignant neoplasm of bronchus and lung: Secondary | ICD-10-CM | POA: Insufficient documentation

## 2022-11-28 DIAGNOSIS — Z79899 Other long term (current) drug therapy: Secondary | ICD-10-CM | POA: Diagnosis not present

## 2022-11-28 DIAGNOSIS — C7B8 Other secondary neuroendocrine tumors: Secondary | ICD-10-CM | POA: Insufficient documentation

## 2022-11-28 LAB — CBC WITH DIFFERENTIAL (CANCER CENTER ONLY)
Abs Immature Granulocytes: 0.01 10*3/uL (ref 0.00–0.07)
Basophils Absolute: 0 10*3/uL (ref 0.0–0.1)
Basophils Relative: 1 %
Eosinophils Absolute: 0.1 10*3/uL (ref 0.0–0.5)
Eosinophils Relative: 2 %
HCT: 40 % (ref 36.0–46.0)
Hemoglobin: 13.4 g/dL (ref 12.0–15.0)
Immature Granulocytes: 0 %
Lymphocytes Relative: 26 %
Lymphs Abs: 1 10*3/uL (ref 0.7–4.0)
MCH: 30.4 pg (ref 26.0–34.0)
MCHC: 33.5 g/dL (ref 30.0–36.0)
MCV: 90.7 fL (ref 80.0–100.0)
Monocytes Absolute: 0.2 10*3/uL (ref 0.1–1.0)
Monocytes Relative: 6 %
Neutro Abs: 2.6 10*3/uL (ref 1.7–7.7)
Neutrophils Relative %: 65 %
Platelet Count: 133 10*3/uL — ABNORMAL LOW (ref 150–400)
RBC: 4.41 MIL/uL (ref 3.87–5.11)
RDW: 13.3 % (ref 11.5–15.5)
WBC Count: 3.9 10*3/uL — ABNORMAL LOW (ref 4.0–10.5)
nRBC: 0 % (ref 0.0–0.2)

## 2022-11-28 LAB — CMP (CANCER CENTER ONLY)
ALT: 14 U/L (ref 0–44)
AST: 20 U/L (ref 15–41)
Albumin: 4.1 g/dL (ref 3.5–5.0)
Alkaline Phosphatase: 53 U/L (ref 38–126)
Anion gap: 5 (ref 5–15)
BUN: 16 mg/dL (ref 8–23)
CO2: 28 mmol/L (ref 22–32)
Calcium: 9.4 mg/dL (ref 8.9–10.3)
Chloride: 106 mmol/L (ref 98–111)
Creatinine: 0.7 mg/dL (ref 0.44–1.00)
GFR, Estimated: >60 mL/min (ref >60)
Glucose, Bld: 78 mg/dL (ref 70–99)
Potassium: 4 mmol/L (ref 3.5–5.1)
Sodium: 139 mmol/L (ref 135–145)
Total Bilirubin: 0.5 mg/dL (ref 0.3–1.2)
Total Protein: 6.8 g/dL (ref 6.5–8.1)

## 2022-11-28 MED ORDER — ROSUVASTATIN CALCIUM 5 MG PO TABS: 5.0000 mg | ORAL_TABLET | Freq: Every evening | ORAL | 1 refills | Status: DC

## 2022-11-28 NOTE — Telephone Encounter (Signed)
Pharmacy Requested refill.

## 2022-11-30 ENCOUNTER — Inpatient Hospital Stay: Payer: Medicare HMO | Admitting: Internal Medicine

## 2022-11-30 ENCOUNTER — Other Ambulatory Visit: Payer: Self-pay

## 2022-11-30 VITALS — BP 166/91 | HR 91 | Temp 97.2°F | Resp 16 | Wt 118.0 lb

## 2022-11-30 DIAGNOSIS — C7A8 Other malignant neuroendocrine tumors: Secondary | ICD-10-CM | POA: Diagnosis not present

## 2022-11-30 DIAGNOSIS — M542 Cervicalgia: Secondary | ICD-10-CM | POA: Diagnosis not present

## 2022-11-30 DIAGNOSIS — K769 Liver disease, unspecified: Secondary | ICD-10-CM

## 2022-11-30 DIAGNOSIS — D72819 Decreased white blood cell count, unspecified: Secondary | ICD-10-CM | POA: Diagnosis not present

## 2022-11-30 DIAGNOSIS — D696 Thrombocytopenia, unspecified: Secondary | ICD-10-CM | POA: Diagnosis not present

## 2022-11-30 DIAGNOSIS — C7B8 Other secondary neuroendocrine tumors: Secondary | ICD-10-CM | POA: Diagnosis not present

## 2022-11-30 DIAGNOSIS — Z79899 Other long term (current) drug therapy: Secondary | ICD-10-CM | POA: Diagnosis not present

## 2022-11-30 DIAGNOSIS — Z85118 Personal history of other malignant neoplasm of bronchus and lung: Secondary | ICD-10-CM | POA: Diagnosis not present

## 2022-11-30 DIAGNOSIS — R5383 Other fatigue: Secondary | ICD-10-CM | POA: Diagnosis not present

## 2022-11-30 DIAGNOSIS — Z902 Acquired absence of lung [part of]: Secondary | ICD-10-CM | POA: Diagnosis not present

## 2022-11-30 LAB — CHROMOGRANIN A: Chromogranin A (ng/mL): 170.3 ng/mL — ABNORMAL HIGH (ref 0.0–101.8)

## 2022-11-30 NOTE — Progress Notes (Signed)
[facility] [phone]   [phone]  OFFICE PROGRESS NOTE  [name], MD [place] [address]  DIAGNOSIS:  1) stage Ib (T2 a, N0, M0) non-small cell lung cancer, adenocarcinoma predominantly acinar with lipidic features diagnosed in August 2020. Molecular studies showed negative for EGFR mutation and negative PD-L1 expression. 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   PRIOR THERAPY:  Status post right upper lobectomy with lymph node dissection in [place]).   CURRENT THERAPY: Observation.  INTERVAL HISTORY: Jacqueline Hunt 89 y.o. female returns to the clinic today for 6 months follow-up visit.  The patient is feeling fine today with no concerning complaints except for occasional neck pain.  She denied having any current chest pain, shortness of breath, cough or hemoptysis.  She has no recent weight loss or night sweats.  She has no hot flashes or diarrhea.  She denied having any nausea, vomiting, abdominal pain or constipation.  She is currently on observation.  She is here today for evaluation and repeat blood work.  MEDICAL HISTORY: Past Medical History:  Diagnosis Date   Arthritis    Brain tumor ([place])    Constipation due to opioid therapy    hospitalized d/t constipation   Dyspnea    High blood pressure    Lung cancer ([place])    Osteopenia    Osteoporosis    Parathyroid adenoma     ALLERGIES:  has No Known Allergies.  MEDICATIONS:  Current Outpatient Medications  Medication Sig Dispense Refill   aspirin EC 81 MG tablet Take 81 mg by mouth daily.     Cholecalciferol (VITAMIN D3 PO) Take 500 mg by mouth daily.     Doxylamine Succinate, Sleep, (UNISOM PO) Take by mouth.     estradiol (CLIMARA - DOSED IN MG/24 HR) 0.05 mg/24hr patch Place 1 patch (0.05 mg total) onto the skin once a week. 12 patch 3   latanoprost (XALATAN) 0.005 %  ophthalmic solution Place 1 drop into both eyes at bedtime.     lisinopril (ZESTRIL) 10 MG tablet TAKE 1 TABLET BY MOUTH TWICE A DAY 180 tablet 1   Multiple Vitamins-Minerals (DAILY MULTIVITAMIN PO) Take 1 tablet by mouth daily.     rosuvastatin (CRESTOR) 5 MG tablet Take 1 tablet (5 mg total) by mouth every evening. 90 tablet 1   vitamin B-12 (CYANOCOBALAMIN) 500 MCG tablet Take 500 mcg by mouth daily.     No current facility-administered medications for this visit.    SURGICAL HISTORY:  Past Surgical History:  Procedure Laterality Date   brain meningioma excesion     CATARACT EXTRACTION     CESAREAN SECTION     GALLBLADDER SURGERY     gallbladder surgery      LOBECTOMY     TONSILLECTOMY     TONSILLECTOMY     WRIST FRACTURE SURGERY      REVIEW OF SYSTEMS:  A comprehensive review of systems was negative except for: Constitutional: positive for fatigue Musculoskeletal: positive for neck pain   PHYSICAL EXAMINATION: General appearance: alert, cooperative, fatigued, and no distress Head: Normocephalic, without obvious abnormality, atraumatic Neck: no adenopathy, no JVD, supple, symmetrical, trachea midline, and thyroid not enlarged, symmetric, no tenderness/mass/nodules Lymph nodes: Cervical, supraclavicular, and axillary nodes normal. Resp: clear to auscultation bilaterally Back: symmetric, no curvature. ROM normal. No CVA tenderness. Cardio: regular rate and rhythm, S1, S2 normal, no murmur, click,  rub or gallop GI: soft, non-tender; bowel sounds normal; no masses,  no organomegaly Extremities: extremities normal, atraumatic, no cyanosis or edema  ECOG PERFORMANCE STATUS: 1 - Symptomatic but completely ambulatory  Blood pressure (!) 166/91, pulse 91, temperature (!) 97.2 F (36.2 C), temperature source Temporal, resp. rate 16, weight 118 lb (53.5 kg), SpO2 98 %.  LABORATORY DATA: Lab Results  Component Value Date   WBC 3.9 (L) 11/28/2022   HGB 13.4 11/28/2022   HCT 40.0  11/28/2022   MCV 90.7 11/28/2022   PLT 133 (L) 11/28/2022      Chemistry      Component Value Date/Time   NA 139 11/28/2022 1002   K 4.0 11/28/2022 1002   CL 106 11/28/2022 1002   CO2 28 11/28/2022 1002   BUN 16 11/28/2022 1002   BUN 20 03/12/2020 0000   CREATININE 0.70 11/28/2022 1002   CREATININE 0.67 08/30/2020 1512   GLU 179 03/12/2020 0000      Component Value Date/Time   CALCIUM 9.4 11/28/2022 1002   ALKPHOS 53 11/28/2022 1002   AST 20 11/28/2022 1002   ALT 14 11/28/2022 1002   BILITOT 0.5 11/28/2022 1002       RADIOGRAPHIC STUDIES: No results found.   ASSESSMENT AND PLAN: This is a very pleasant 89 years old white female diagnosed with stage Ib (T2 a, N0, M0) non-small cell lung cancer, adenocarcinoma predominantly acinar with lipidic features diagnosed in August 2020 status post right upper lobectomy with lymph node dissection in [place]). Molecular studies showed negative for EGFR mutation and negative PD-L1 expression. The patient is currently on observation and she is feeling fine today with no concerning complaints. Her recent scan of the chest showed no concerning findings for disease recurrence or progression in the chest but there was suspicious liver lesion that was seen on previous PET scan when she was in [place] and they were not hypermetabolic but still concerning.  I recommended for the patient to have MRI of the liver for evaluation of this lesion and to rule out metastatic disease. I ordered MRI of the liver that was performed recently and unfortunately showed concerning findings for liver metastasis. The patient underwent ultrasound-guided core biopsy of one of the liver lesion and the final pathology was consistent with well-differentiated neuroendocrine carcinoma of gastrointestinal primary. She was seen by second opinion by Dr. Leamon Arnt at [facility] and he recommended for the patient to consider PET Dotatate and  treatment with octreotide after the scan if she becomes symptomatic. The patient is currently on observation and she is feeling fine with no concerning complaints except for mild fatigue. Blood work today is unremarkable except for mild leukocytopenia and thrombocytopenia with elevated chromogranin level. I recommended for the patient to continue on observation with repeat lab work in 6 months. I will submit her request for the PET Dotatate scan for further evaluation of her disease based on the recommendation of Dr. Leamon Arnt. She was advised to call immediately if she has any other concerning symptoms in the interval. The patient voices understanding of current disease status and treatment options and is in agreement with the current care plan.  All questions were answered. The patient knows to call the clinic with any problems, questions or concerns. We can certainly see the patient much sooner if necessary.  The total time spent in the appointment was 20 minutes.  Disclaimer: This note was dictated with voice recognition software. Similar sounding words  can inadvertently be transcribed and may not be corrected upon review.

## 2022-12-04 ENCOUNTER — Encounter: Payer: Self-pay | Admitting: Adult Health

## 2022-12-04 ENCOUNTER — Ambulatory Visit (INDEPENDENT_AMBULATORY_CARE_PROVIDER_SITE_OTHER): Payer: Medicare HMO | Admitting: Adult Health

## 2022-12-04 VITALS — BP 116/82 | HR 84 | Temp 95.2°F | Ht 61.0 in | Wt 120.2 lb

## 2022-12-04 DIAGNOSIS — M542 Cervicalgia: Secondary | ICD-10-CM

## 2022-12-04 DIAGNOSIS — C3491 Malignant neoplasm of unspecified part of right bronchus or lung: Secondary | ICD-10-CM

## 2022-12-04 DIAGNOSIS — I10 Essential (primary) hypertension: Secondary | ICD-10-CM | POA: Diagnosis not present

## 2022-12-04 NOTE — Progress Notes (Unsigned)
[facility] clinic  Provider:   Code Status:   Goals of Care:     08/21/2022    3:11 PM  Advanced Directives  Does Patient Have a Medical Advance Directive? Yes  Type of Paramedic of [place];Living will;Out of facility DNR (pink MOST or yellow form)  Does patient want to make changes to medical advance directive? No - Patient declined  Copy of [place] in Chart? No - copy requested     Chief Complaint  Patient presents with   Acute Visit    Patient presents today for left neck pain for a few months now. She reports waking up at night due to the pain.    HPI: Patient is a 89 y.o. female seen today for an acute visit for  Past Medical History:  Diagnosis Date   Arthritis    Brain tumor ([place])    Constipation due to opioid therapy    hospitalized d/t constipation   Dyspnea    High blood pressure    Lung cancer ([place])    Osteopenia    Osteoporosis    Parathyroid adenoma     Past Surgical History:  Procedure Laterality Date   brain meningioma excesion     CATARACT EXTRACTION     CESAREAN SECTION     GALLBLADDER SURGERY     gallbladder surgery      LOBECTOMY     TONSILLECTOMY     TONSILLECTOMY     WRIST FRACTURE SURGERY      No Known Allergies  Outpatient Encounter Medications as of 12/04/2022  Medication Sig   aspirin EC 81 MG tablet Take 81 mg by mouth daily.   Cholecalciferol (VITAMIN D3 PO) Take 500 mg by mouth daily.   Doxylamine Succinate, Sleep, (UNISOM PO) Take by mouth.   estradiol (CLIMARA - DOSED IN MG/24 HR) 0.05 mg/24hr patch Place 1 patch (0.05 mg total) onto the skin once a week.   latanoprost (XALATAN) 0.005 % ophthalmic solution Place 1 drop into both eyes at bedtime.   lisinopril (ZESTRIL) 10 MG tablet TAKE 1 TABLET BY MOUTH TWICE A DAY   Multiple Vitamins-Minerals (DAILY MULTIVITAMIN PO) Take 1 tablet by mouth daily.   rosuvastatin (CRESTOR) 5 MG tablet Take 1 tablet (5 mg total) by mouth every evening.    vitamin B-12 (CYANOCOBALAMIN) 500 MCG tablet Take 500 mcg by mouth daily.   No facility-administered encounter medications on file as of 12/04/2022.    Review of Systems:  Review of Systems  Constitutional:  Negative for appetite change, chills, fatigue and fever.  HENT:  Negative for congestion, hearing loss, rhinorrhea and sore throat.   Eyes: Negative.   Respiratory:  Negative for cough, shortness of breath and wheezing.   Cardiovascular:  Negative for chest pain, palpitations and leg swelling.  Gastrointestinal:  Negative for abdominal pain, constipation, diarrhea, nausea and vomiting.  Genitourinary:  Negative for dysuria.  Musculoskeletal:  Negative for arthralgias, back pain and myalgias.  Skin:  Negative for color change, rash and wound.  Neurological:  Negative for dizziness, weakness and headaches.  Psychiatric/Behavioral:  Negative for behavioral problems. The patient is not nervous/anxious.     Health Maintenance  Topic Date Due   COVID-19 Vaccine (10 - 2023-24 season) 10/12/2022   Medicare Annual Wellness (AWV)  08/22/2023   DTaP/Tdap/Td (3 - Td or Tdap) 11/30/2027   Pneumonia Vaccine 32+ Years old  Completed   INFLUENZA VACCINE  Completed   DEXA SCAN  Completed  Zoster Vaccines- Shingrix  Completed   HPV VACCINES  Aged Out    Physical Exam: Vitals:   12/04/22 1453  BP: 116/82  Pulse: 84  Temp: (!) 95.2 F (35.1 C)  SpO2: 97%  Weight: 120 lb 3.2 oz (54.5 kg)  Height: 5\' 1"  (1.549 m)   Body mass index is 22.71 kg/m. Physical Exam Constitutional:      Appearance: Normal appearance.  HENT:     Head: Normocephalic and atraumatic.     Nose: Nose normal.     Mouth/Throat:     Mouth: Mucous membranes are moist.  Eyes:     Conjunctiva/sclera: Conjunctivae normal.  Cardiovascular:     Rate and Rhythm: Normal rate and regular rhythm.  Pulmonary:     Effort: Pulmonary effort is normal.     Breath sounds: Normal breath sounds.  Abdominal:     General:  Bowel sounds are normal.     Palpations: Abdomen is soft.  Musculoskeletal:        General: Normal range of motion.     Cervical back: Normal range of motion.  Skin:    General: Skin is warm and dry.  Neurological:     General: No focal deficit present.     Mental Status: She is alert and oriented to person, place, and time.  Psychiatric:        Mood and Affect: Mood normal.        Behavior: Behavior normal.        Thought Content: Thought content normal.        Judgment: Judgment normal.     Labs reviewed: Basic Metabolic Panel: Recent Labs    05/25/22 1243 11/28/22 1002  NA 139 139  K 4.3 4.0  CL 104 106  CO2 31 28  GLUCOSE 76 78  BUN 18 16  CREATININE 0.72 0.70  CALCIUM 9.7 9.4   Liver Function Tests: Recent Labs    05/25/22 1243 11/28/22 1002  AST 19 20  ALT 15 14  ALKPHOS 70 53  BILITOT 0.3 0.5  PROT 6.8 6.8  ALBUMIN 4.1 4.1   No results for input(s): "LIPASE", "AMYLASE" in the last 8760 hours. No results for input(s): "AMMONIA" in the last 8760 hours. CBC: Recent Labs    05/25/22 1243 11/28/22 1002  WBC 4.7 3.9*  NEUTROABS 2.9 2.6  HGB 13.2 13.4  HCT 38.9 40.0  MCV 90.0 90.7  PLT 180 133*   Lipid Panel: No results for input(s): "CHOL", "HDL", "LDLCALC", "TRIG", "CHOLHDL", "LDLDIRECT" in the last 8760 hours. No results found for: "HGBA1C"  Procedures since last visit: No results found.  Assessment/Plan     Labs/tests ordered:  * No order type specified * Next appt:  12/20/2022

## 2022-12-04 NOTE — Patient Instructions (Signed)
Cervical Radiculopathy  Cervical radiculopathy happens when a nerve in the neck (a cervical nerve) is pinched or bruised. This condition can happen because of an injury to the cervical spine (vertebrae) in the neck, or as part of the normal aging process. Pressure on the cervical nerves can cause pain or numbness that travels from the neck all the way down to the arm and fingers. This condition usually gets better with rest. Treatment may be needed if the condition does not improve. What are the causes? This condition may be caused by: A neck injury. A bulging (herniated) disk. Muscle spasms. Muscle tightness in the neck due to overuse. Arthritis. Breakdown or degeneration in the bones and joints of the spine (spondylosis) due to aging. Bone spurs that may develop near the cervical nerves. What are the signs or symptoms? Symptoms of this condition include: Pain. The pain may travel from the neck to the arm and hand. The pain can be severe or irritating. It may get worse when you move your neck. Numbness or tingling in your arm or hand. Weakness in the affected arm and hand, in severe cases. How is this diagnosed? This condition may be diagnosed based on your symptoms, your medical history, and a physical exam. You may also have tests, including: X-rays. CT scan. MRI. Electromyogram (EMG). Nerve conduction tests. How is this treated? In many cases, treatment is not needed for this condition. With rest, the condition usually gets better over time. If treatment is needed, options may include: Wearing a soft neck collar (cervical collar) for short periods of time. Doing physical therapy to strengthen your neck muscles. Taking medicines. These may include NSAIDs, such as ibuprofen, or oral corticosteroids. Having spinal injections, in severe cases. Having surgery. This may be needed if other treatments do not help. Different types of surgery may be done depending on the cause of this  condition. Follow these instructions at home: If you have a cervical collar: Wear it as told by your health care provider. Remove it only as told by your health care provider. Ask your health care provider if you can remove the cervical collar for cleaning and bathing. If you are allowed to remove the collar for cleaning or bathing: Follow instructions from your health care provider about how to remove the collar safely. Clean the collar by wiping it with mild soap and water and drying it completely. Take out any removable pads in the collar every 1-2 days, and wash them by hand with soap and water. Let them air-dry completely before you put them back in the collar. Check your skin under the collar for irritation or sores. If you see any, tell your health care provider. Managing pain     Take over-the-counter and prescription medicines only as told by your health care provider. If directed, put ice on the affected area. To do this: If you have a soft neck collar, remove it as told by your health care provider. Put ice in a plastic bag. Place a towel between your skin and the bag. Leave the ice on for 20 minutes, 2-3 times a day. Remove the ice if your skin turns bright red. This is very important. If you cannot feel pain, heat, or cold, you have a greater risk of damage to the area. If applying ice does not help, you can try using heat. Use the heat source that your health care provider recommends, such as a moist heat pack or a heating pad. Place a towel between  your skin and the heat source. Leave the heat on for 20-30 minutes. Remove the heat if your skin turns bright red. This is especially important if you are unable to feel pain, heat, or cold. You have a greater risk of getting burned. Try a gentle neck and shoulder massage to help relieve symptoms. Activity Rest as needed. Return to your normal activities as told by your health care provider. Ask your health care provider what  activities are safe for you. Do stretching and strengthening exercises as told by your health care provider or your physical therapist. You may have to avoid lifting. Ask your health care provider how much you can safely lift. General instructions Use a flat pillow when you sleep. Do not drive while wearing a cervical collar. If you do not have a cervical collar, ask your health care provider if it is safe to drive while your neck heals. Ask your health care provider if the medicine prescribed to you requires you to avoid driving or using machinery. Do not use any products that contain nicotine or tobacco. These products include cigarettes, chewing tobacco, and vaping devices, such as e-cigarettes. If you need help quitting, ask your health care provider. Keep all follow-up visits. This is important. Contact a health care provider if: Your condition does not improve with treatment. Get help right away if: Your pain gets much worse and is not controlled with medicines. You have weakness or numbness in your hand, arm, face, or leg. You have a high fever. You have a stiff, rigid neck. You lose control of your bowels or your bladder (have incontinence). You have trouble with walking, balance, or speaking. Summary Cervical radiculopathy happens when a nerve in the neck is pinched or bruised. A nerve can get pinched from a bulging disk, arthritis, muscle spasms, or an injury to the neck. Symptoms include pain, tingling, or numbness radiating from the neck to the arm or hand. Weakness can also occur in severe cases. Treatment may include rest, wearing a cervical collar, and physical therapy. Medicines may be prescribed to help with pain. In severe cases, injections or surgery may be needed. This information is not intended to replace advice given to you by your health care provider. Make sure you discuss any questions you have with your health care provider. Document Revised: 05/19/2021 Document  Reviewed: 05/19/2021 Elsevier Patient Education  [place].

## 2022-12-05 DIAGNOSIS — H35352 Cystoid macular degeneration, left eye: Secondary | ICD-10-CM | POA: Diagnosis not present

## 2022-12-05 DIAGNOSIS — H43812 Vitreous degeneration, left eye: Secondary | ICD-10-CM | POA: Diagnosis not present

## 2022-12-05 DIAGNOSIS — H34812 Central retinal vein occlusion, left eye, with macular edema: Secondary | ICD-10-CM | POA: Diagnosis not present

## 2022-12-05 DIAGNOSIS — H34232 Retinal artery branch occlusion, left eye: Secondary | ICD-10-CM | POA: Diagnosis not present

## 2022-12-05 DIAGNOSIS — H3562 Retinal hemorrhage, left eye: Secondary | ICD-10-CM | POA: Diagnosis not present

## 2022-12-07 DIAGNOSIS — Z0100 Encounter for examination of eyes and vision without abnormal findings: Secondary | ICD-10-CM | POA: Diagnosis not present

## 2022-12-08 ENCOUNTER — Encounter: Payer: Self-pay | Admitting: Adult Health

## 2022-12-08 DIAGNOSIS — M542 Cervicalgia: Secondary | ICD-10-CM

## 2022-12-12 ENCOUNTER — Ambulatory Visit
Admission: RE | Admit: 2022-12-12 | Discharge: 2022-12-12 | Disposition: A | Discharge status: Home / Self Care | Payer: Medicare HMO | Source: Ambulatory Visit | Attending: Adult Health | Admitting: Adult Health

## 2022-12-12 DIAGNOSIS — M4312 Spondylolisthesis, cervical region: Secondary | ICD-10-CM | POA: Diagnosis not present

## 2022-12-12 DIAGNOSIS — M542 Cervicalgia: Secondary | ICD-10-CM | POA: Diagnosis not present

## 2022-12-19 NOTE — Progress Notes (Signed)
-    no acute fracture but has osseous neural foraminal stenosis (narrowing of spaces between the bones) - Recommending to wear neck brace.

## 2022-12-20 ENCOUNTER — Encounter: Payer: Self-pay | Admitting: Family Medicine

## 2022-12-20 ENCOUNTER — Ambulatory Visit (INDEPENDENT_AMBULATORY_CARE_PROVIDER_SITE_OTHER): Payer: Medicare HMO | Admitting: Family Medicine

## 2022-12-20 VITALS — BP 120/86 | HR 79 | Temp 97.1°F | Ht 61.0 in | Wt 119.0 lb

## 2022-12-20 DIAGNOSIS — M542 Cervicalgia: Secondary | ICD-10-CM | POA: Diagnosis not present

## 2022-12-20 NOTE — Patient Instructions (Signed)
Use Biofreeze 2-3 times a day to massage the neck muscles Continue massages as you are able Tylenol arthritis strength twice a day

## 2022-12-20 NOTE — Progress Notes (Signed)
Provider:  Alain Honey, MD  Careteam: Patient Care Team: [name], MD as PCP - General (Family Medicine) Harlene Ramus (Podiatry)  PLACE OF SERVICE:  [place] Directive information    No Known Allergies  Chief Complaint  Patient presents with   Medical Management of Chronic Issues    Patient presents today for a 15 month follow-up.     HPI: Patient is a 89 y.o. female .  Patient is here to follow-up neck pain.  Since her last visit she had a C-spine film that showed multilevel osseous neural foraminal stenosis.  She denies any radiculopathy.  Pain is primarily in the left side of her neck in the area of the trapezius muscle.  She has been going to get a massage occasionally that gives her some relief. She is also followed by oncology for lung cancer and probably some GI cancer with mets to the liver.  There is no ongoing therapy but they are taking a watch and observe plan of care.  Review of Systems:  Review of Systems  Constitutional: Negative.   HENT: Negative.    Respiratory: Negative.    Cardiovascular: Negative.   Musculoskeletal:  Positive for neck pain.  Neurological: Negative.   Psychiatric/Behavioral: Negative.    All other systems reviewed and are negative.   Past Medical History:  Diagnosis Date   Arthritis    Brain tumor ([place])    Constipation due to opioid therapy    hospitalized d/t constipation   Dyspnea    High blood pressure    Lung cancer ([place])    Osteopenia    Osteoporosis    Parathyroid adenoma    Past Surgical History:  Procedure Laterality Date   brain meningioma excesion     CATARACT EXTRACTION     CESAREAN SECTION     GALLBLADDER SURGERY     gallbladder surgery      LOBECTOMY     TONSILLECTOMY     TONSILLECTOMY     WRIST FRACTURE SURGERY     Social History:   reports that she quit smoking about 60 years ago. Her smoking use included cigarettes. She has a 57.10 pack-year smoking history. She has  never used smokeless tobacco. She reports that she does not drink alcohol and does not use drugs.  History reviewed. No pertinent family history.  Medications: Patient's Medications  New Prescriptions   No medications on file  Previous Medications   ASPIRIN EC 81 MG TABLET    Take 81 mg by mouth daily.   CHOLECALCIFEROL (VITAMIN D3 PO)    Take 500 mg by mouth daily.   DOXYLAMINE SUCCINATE, SLEEP, (UNISOM PO)    Take by mouth.   ESTRADIOL (CLIMARA - DOSED IN MG/24 HR) 0.05 MG/24HR PATCH    Place 1 patch (0.05 mg total) onto the skin once a week.   LATANOPROST (XALATAN) 0.005 % OPHTHALMIC SOLUTION    Place 1 drop into both eyes at bedtime.   LISINOPRIL (ZESTRIL) 10 MG TABLET    TAKE 1 TABLET BY MOUTH TWICE A DAY   MULTIPLE VITAMINS-MINERALS (DAILY MULTIVITAMIN PO)    Take 1 tablet by mouth daily.   ROSUVASTATIN (CRESTOR) 5 MG TABLET    Take 1 tablet (5 mg total) by mouth every evening.   VITAMIN B-12 (CYANOCOBALAMIN) 500 MCG TABLET    Take 500 mcg by mouth daily.  Modified Medications   No medications on file  Discontinued Medications   No medications on  file    Physical Exam:  Vitals:   12/20/22 1117  BP: 120/86  Pulse: 79  Temp: (!) 97.1 F (36.2 C)  SpO2: 99%  Weight: 119 lb (54 kg)  Height: 5\' 1"  (1.549 m)   Body mass index is 22.48 kg/m. Wt Readings from Last 3 Encounters:  12/20/22 119 lb (54 kg)  12/04/22 120 lb 3.2 oz (54.5 kg)  11/30/22 118 lb (53.5 kg)    Physical Exam Vitals and nursing note reviewed.  Constitutional:      Appearance: Normal appearance.  Neck:     Comments: Pain increases on left side of neck with range of motion.  Strength is equal in upper extremities and reflexes are likewise symmetric Neurological:     Mental Status: She is alert.     Labs reviewed: Basic Metabolic Panel: Recent Labs    05/25/22 1243 11/28/22 1002  NA 139 139  K 4.3 4.0  CL 104 106  CO2 31 28  GLUCOSE 76 78  BUN 18 16  CREATININE 0.72 0.70  CALCIUM 9.7  9.4   Liver Function Tests: Recent Labs    05/25/22 1243 11/28/22 1002  AST 19 20  ALT 15 14  ALKPHOS 70 53  BILITOT 0.3 0.5  PROT 6.8 6.8  ALBUMIN 4.1 4.1   No results for input(s): "LIPASE", "AMYLASE" in the last 8760 hours. No results for input(s): "AMMONIA" in the last 8760 hours. CBC: Recent Labs    05/25/22 1243 11/28/22 1002  WBC 4.7 3.9*  NEUTROABS 2.9 2.6  HGB 13.2 13.4  HCT 38.9 40.0  MCV 90.0 90.7  PLT 180 133*   Lipid Panel: No results for input(s): "CHOL", "HDL", "LDLCALC", "TRIG", "CHOLHDL", "LDLDIRECT" in the last 8760 hours. TSH: No results for input(s): "TSH" in the last 8760 hours. A1C: No results found for: "HGBA1C"   Assessment/Plan  1. Neck pain Likely muscular but there is a fair amount of osteoarthritis in her neck as well as foraminal stenosis. Will try Tylenol arthritis strength as well as massaging with Biofreeze. If symptoms start to radiate down her arm or into her hand may want to try different approach Also mention possibility of physical therapy but patient does not have easy transportation and this could pose a problem    Alain Honey, MD [place] [phone]

## 2023-01-02 DIAGNOSIS — H34812 Central retinal vein occlusion, left eye, with macular edema: Secondary | ICD-10-CM | POA: Diagnosis not present

## 2023-01-02 DIAGNOSIS — H348121 Central retinal vein occlusion, left eye, with retinal neovascularization: Secondary | ICD-10-CM | POA: Diagnosis not present

## 2023-01-22 ENCOUNTER — Ambulatory Visit (HOSPITAL_COMMUNITY)
Admission: RE | Admit: 2023-01-22 | Discharge: 2023-01-22 | Disposition: A | Discharge status: Home / Self Care | Payer: Medicare HMO | Source: Ambulatory Visit | Attending: Internal Medicine | Admitting: Internal Medicine

## 2023-01-22 ENCOUNTER — Encounter: Payer: Self-pay | Admitting: Internal Medicine

## 2023-01-22 DIAGNOSIS — Z85118 Personal history of other malignant neoplasm of bronchus and lung: Secondary | ICD-10-CM | POA: Diagnosis not present

## 2023-01-22 DIAGNOSIS — R933 Abnormal findings on diagnostic imaging of other parts of digestive tract: Secondary | ICD-10-CM | POA: Insufficient documentation

## 2023-01-22 DIAGNOSIS — C7A8 Other malignant neuroendocrine tumors: Secondary | ICD-10-CM | POA: Diagnosis not present

## 2023-01-22 DIAGNOSIS — R590 Localized enlarged lymph nodes: Secondary | ICD-10-CM | POA: Insufficient documentation

## 2023-01-22 DIAGNOSIS — C787 Secondary malignant neoplasm of liver and intrahepatic bile duct: Secondary | ICD-10-CM | POA: Diagnosis not present

## 2023-01-22 DIAGNOSIS — K769 Liver disease, unspecified: Secondary | ICD-10-CM | POA: Insufficient documentation

## 2023-01-22 MED ORDER — COPPER CU 64 DOTATATE 1 MCI/ML IV SOLN
4.0000 | Freq: Once | INTRAVENOUS | Status: AC
  Administered 2023-01-22: 4.03 via INTRAVENOUS

## 2023-01-23 DIAGNOSIS — H401131 Primary open-angle glaucoma, bilateral, mild stage: Secondary | ICD-10-CM | POA: Diagnosis not present

## 2023-01-23 DIAGNOSIS — H34232 Retinal artery branch occlusion, left eye: Secondary | ICD-10-CM | POA: Diagnosis not present

## 2023-01-23 DIAGNOSIS — H3562 Retinal hemorrhage, left eye: Secondary | ICD-10-CM | POA: Diagnosis not present

## 2023-01-23 DIAGNOSIS — H348121 Central retinal vein occlusion, left eye, with retinal neovascularization: Secondary | ICD-10-CM | POA: Diagnosis not present

## 2023-01-23 DIAGNOSIS — H35352 Cystoid macular degeneration, left eye: Secondary | ICD-10-CM | POA: Diagnosis not present

## 2023-01-25 ENCOUNTER — Encounter: Payer: Self-pay | Admitting: Internal Medicine

## 2023-01-30 ENCOUNTER — Other Ambulatory Visit: Payer: Self-pay | Admitting: Medical Oncology

## 2023-01-30 ENCOUNTER — Inpatient Hospital Stay: Payer: Medicare HMO

## 2023-01-30 ENCOUNTER — Inpatient Hospital Stay: Payer: Medicare HMO | Attending: Internal Medicine | Admitting: Internal Medicine

## 2023-01-30 VITALS — BP 158/82 | HR 79 | Temp 98.2°F | Resp 18 | Wt 117.2 lb

## 2023-01-30 DIAGNOSIS — Z902 Acquired absence of lung [part of]: Secondary | ICD-10-CM | POA: Insufficient documentation

## 2023-01-30 DIAGNOSIS — Z79899 Other long term (current) drug therapy: Secondary | ICD-10-CM | POA: Insufficient documentation

## 2023-01-30 DIAGNOSIS — Z85118 Personal history of other malignant neoplasm of bronchus and lung: Secondary | ICD-10-CM | POA: Diagnosis not present

## 2023-01-30 DIAGNOSIS — C7A8 Other malignant neuroendocrine tumors: Secondary | ICD-10-CM | POA: Diagnosis not present

## 2023-01-30 DIAGNOSIS — C7B8 Other secondary neuroendocrine tumors: Secondary | ICD-10-CM | POA: Diagnosis not present

## 2023-01-30 NOTE — Progress Notes (Signed)
[place] [phone]   [phone]  OFFICE PROGRESS NOTE  [name], MD [place] [address]  DIAGNOSIS:  1) stage Ib (T2 a, N0, M0) non-small cell lung cancer, adenocarcinoma predominantly acinar with lipidic features diagnosed in August 2020. Molecular studies showed negative for EGFR mutation and negative PD-L1 expression. 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   PRIOR THERAPY:  Status post right upper lobectomy with lymph node dissection in [place]).   CURRENT THERAPY: Observation.  INTERVAL HISTORY: Jacqueline Hunt 89 y.o. female returns to the clinic today for follow-up visit.  The patient is feeling fine today with no concerning complaints.  She was very anxious about the results of her dotatate PET scan.  She denied having any current chest pain, shortness of breath, cough or hemoptysis.  She has no nausea, vomiting, diarrhea or constipation.  She has no headache or visual changes.  She denied having any significant weight loss or night sweats.  She is here today for evaluation and discussion of her scan results.  She has several question about the nature of her disease as well as the PET scan results.  MEDICAL HISTORY: Past Medical History:  Diagnosis Date   Arthritis    Brain tumor ([place])    Constipation due to opioid therapy    hospitalized d/t constipation   Dyspnea    High blood pressure    Lung cancer ([place])    Osteopenia    Osteoporosis    Parathyroid adenoma     ALLERGIES:  has No Known Allergies.  MEDICATIONS:  Current Outpatient Medications  Medication Sig Dispense Refill   aspirin EC 81 MG tablet Take 81 mg by mouth daily.     Cholecalciferol (VITAMIN D3 PO) Take 500 mg by mouth daily.     Doxylamine Succinate, Sleep, (UNISOM PO) Take by mouth.     estradiol (CLIMARA - DOSED IN MG/24 HR) 0.05 mg/24hr patch Place 1  patch (0.05 mg total) onto the skin once a week. 12 patch 3   latanoprost (XALATAN) 0.005 % ophthalmic solution Place 1 drop into both eyes at bedtime.     lisinopril (ZESTRIL) 10 MG tablet TAKE 1 TABLET BY MOUTH TWICE A DAY 180 tablet 1   Multiple Vitamins-Minerals (DAILY MULTIVITAMIN PO) Take 1 tablet by mouth daily.     rosuvastatin (CRESTOR) 5 MG tablet Take 1 tablet (5 mg total) by mouth every evening. 90 tablet 1   vitamin B-12 (CYANOCOBALAMIN) 500 MCG tablet Take 500 mcg by mouth daily.     No current facility-administered medications for this visit.    SURGICAL HISTORY:  Past Surgical History:  Procedure Laterality Date   brain meningioma excesion     CATARACT EXTRACTION     CESAREAN SECTION     GALLBLADDER SURGERY     gallbladder surgery      LOBECTOMY     TONSILLECTOMY     TONSILLECTOMY     WRIST FRACTURE SURGERY      REVIEW OF SYSTEMS:  Constitutional: negative Eyes: negative Ears, nose, mouth, throat, and face: negative Respiratory: negative Cardiovascular: negative Gastrointestinal: negative Genitourinary:negative Integument/breast: negative Hematologic/lymphatic: negative Musculoskeletal:negative Neurological: negative Behavioral/Psych: negative Endocrine: negative Allergic/Immunologic: negative   PHYSICAL EXAMINATION: General appearance: alert, cooperative, and no distress Head: Normocephalic, without obvious abnormality, atraumatic Neck: no adenopathy, no JVD, supple, symmetrical, trachea midline, and thyroid not enlarged, symmetric, no tenderness/mass/nodules Lymph nodes:  Cervical, supraclavicular, and axillary nodes normal. Resp: clear to auscultation bilaterally Back: symmetric, no curvature. ROM normal. No CVA tenderness. Cardio: regular rate and rhythm, S1, S2 normal, no murmur, click, rub or gallop GI: soft, non-tender; bowel sounds normal; no masses,  no organomegaly Extremities: extremities normal, atraumatic, no cyanosis or edema Neurologic:  Alert and oriented X 3, normal strength and tone. Normal symmetric reflexes. Normal coordination and gait  ECOG PERFORMANCE STATUS: 1 - Symptomatic but completely ambulatory  Blood pressure (!) 158/82, pulse 79, temperature 98.2 F (36.8 C), temperature source Oral, resp. rate 18, weight 117 lb 3 oz (53.2 kg), SpO2 98 %.  LABORATORY DATA: Lab Results  Component Value Date   WBC 3.9 (L) 11/28/2022   HGB 13.4 11/28/2022   HCT 40.0 11/28/2022   MCV 90.7 11/28/2022   PLT 133 (L) 11/28/2022      Chemistry      Component Value Date/Time   NA 139 11/28/2022 1002   K 4.0 11/28/2022 1002   CL 106 11/28/2022 1002   CO2 28 11/28/2022 1002   BUN 16 11/28/2022 1002   BUN 20 03/12/2020 0000   CREATININE 0.70 11/28/2022 1002   CREATININE 0.67 08/30/2020 1512   GLU 179 03/12/2020 0000      Component Value Date/Time   CALCIUM 9.4 11/28/2022 1002   ALKPHOS 53 11/28/2022 1002   AST 20 11/28/2022 1002   ALT 14 11/28/2022 1002   BILITOT 0.5 11/28/2022 1002       RADIOGRAPHIC STUDIES: NM PET DOTATATE SKULL BASE TO MID THIGH  Result Date: 01/22/2023 CLINICAL DATA:  History of non-small cell lung cancer. Recent biopsy of liver metastasis demonstrated well differentiated neuroendocrine tumor. EXAM: NUCLEAR MEDICINE PET SKULL BASE TO THIGH TECHNIQUE: 4.0 mCi copper 59 DOTATATE was injected intravenously. Full-ring PET imaging was performed from the skull base to thigh after the radiotracer. CT data was obtained and used for attenuation correction and anatomic localization. COMPARISON:  None Available. FINDINGS: NECK No radiotracer activity in neck lymph nodes. Incidental CT findings: None CHEST No radiotracer avid pulmonary nodules. No radiotracer avid mediastinal lymph nodes. Ascending thoracic aorta is prominent at 41 mm. Incidental CT finding:None ABDOMEN/PELVIS Multiple intensely radiotracer avid lesions in the liver with SUV max equal up to 22 in the posterior RIGHT hepatic lobe (image 100).  Lesion poorly differentiated on the noncontrast exam. approximately 8 lesions are present within LEFT and RIGHT hepatic lobes. There is moderate radiotracer activity within the gastric body without clear lesion on CT portion exam. No abnormal activity in the pancreas. There is a central mesenteric node/nodule just ventral to the aorta measuring 2.4 cm on image 135/4 with intense radiotracer activity (SUV max equal 42). Lesion in the RIGHT lower quadrant associated is with the endoluminal border of loop of small bowel with SUV max equal 20 on image 146. There is subtle asymmetric bowel wall thickening at this level measuring 2 cm by 1.2 cm (image 146/4). No evidence of bowel obstruction. Physiologic activity noted in the liver, spleen, adrenal glands and kidneys. Incidental CT findings:None SKELETON No focal activity to suggest skeletal metastasis. Incidental CT findings:None IMPRESSION: 1. Focal radiotracer activity associated loop of all small bowel in the RIGHT lower quadrant is concerning for primary neuroendocrine tumor. 2. Multifocal radiotracer avid hepatic metastasis consistent metastatic well differentiated neuroendocrine tumor. 3. Solitary large nodal metastasis ventral to the aorta. 4. Moderate activity associated the stomach is favored physiologic. 5. No pulmonary metastasis or skeletal metastasis. Electronically Signed   By:  Suzy Bouchard M.D.   On: 01/22/2023 12:55     ASSESSMENT AND PLAN: This is a very pleasant 89 years old white female diagnosed with stage Ib (T2 a, N0, M0) non-small cell lung cancer, adenocarcinoma predominantly acinar with lipidic features diagnosed in August 2020 status post right upper lobectomy with lymph node dissection in [place]). Molecular studies showed negative for EGFR mutation and negative PD-L1 expression. The patient is currently on observation and she is feeling fine today with no concerning complaints. Her recent scan of the chest showed no  concerning findings for disease recurrence or progression in the chest but there was suspicious liver lesion that was seen on previous PET scan when she was in [place] and they were not hypermetabolic but still concerning.  I recommended for the patient to have MRI of the liver for evaluation of this lesion and to rule out metastatic disease. I ordered MRI of the liver that was performed recently and unfortunately showed concerning findings for liver metastasis. The patient underwent ultrasound-guided core biopsy of one of the liver lesion and the final pathology was consistent with well-differentiated neuroendocrine carcinoma of gastrointestinal primary. She was seen by second opinion by Dr. Leamon Arnt at [facility] and he recommended for the patient to consider PET Dotatate and treatment with octreotide after the scan if she becomes symptomatic. She had the dotatate PET scan performed recently.  I personally and independently reviewed the scan images and discussed the result and showed the images to the patient today. Her scan showed focal radiotracer activity associated with loops of all small bowel in the right lower quadrant concerning for the primary neuroendocrine tumor in addition to multifocal radiotracer avid hepatic metastasis consistent with metastatic well-differentiated neuroendocrine tumor and solitary large nodal metastasis ventral to the aorta.  The patient is currently asymptomatic. I gave her the option of treatment with octreotide or wait until she has symptoms.  She is not interested in the treatment right now and she will wait until she becomes symptomatic. I will see her back for follow-up visit in 6 months for evaluation with repeat blood work. The patient was advised to call immediately if she has any other concerning symptoms in the interval. The patient voices understanding of current disease status and treatment options and is in agreement with the current  care plan.  All questions were answered. The patient knows to call the clinic with any problems, questions or concerns. We can certainly see the patient much sooner if necessary.  The total time spent in the appointment was 30 minutes.  Disclaimer: This note was dictated with voice recognition software. Similar sounding words can inadvertently be transcribed and may not be corrected upon review.

## 2023-02-13 DIAGNOSIS — H401131 Primary open-angle glaucoma, bilateral, mild stage: Secondary | ICD-10-CM | POA: Diagnosis not present

## 2023-02-13 DIAGNOSIS — H348121 Central retinal vein occlusion, left eye, with retinal neovascularization: Secondary | ICD-10-CM | POA: Diagnosis not present

## 2023-03-01 ENCOUNTER — Telehealth: Payer: Medicare HMO | Admitting: Urgent Care

## 2023-03-01 DIAGNOSIS — I4510 Unspecified right bundle-branch block: Secondary | ICD-10-CM | POA: Diagnosis not present

## 2023-03-01 DIAGNOSIS — R69 Illness, unspecified: Secondary | ICD-10-CM | POA: Diagnosis not present

## 2023-03-01 DIAGNOSIS — R000 Tachycardia, unspecified: Secondary | ICD-10-CM

## 2023-03-01 DIAGNOSIS — I10 Essential (primary) hypertension: Secondary | ICD-10-CM | POA: Diagnosis not present

## 2023-03-01 NOTE — Patient Instructions (Signed)
  Valene Bors, thank you for joining Chaney Malling, PA for today's virtual visit.  While this provider is not your primary care provider (PCP), if your PCP is located in our provider database this encounter information will be shared with them immediately following your visit.   [facility] account gives you access to today's visit and all your visits, tests, and labs performed at [facility] " click here if you don't have a [facility] account or go to [email or web address]  Consent: (Patient) Jacqueline Hunt provided verbal consent for this virtual visit at the beginning of the encounter.  Current Medications:  Current Outpatient Medications:    aspirin EC 81 MG tablet, Take 81 mg by mouth daily., Disp: , Rfl:    Cholecalciferol (VITAMIN D3 PO), Take 500 mg by mouth daily., Disp: , Rfl:    Doxylamine Succinate, Sleep, (UNISOM PO), Take by mouth., Disp: , Rfl:    estradiol (CLIMARA - DOSED IN MG/24 HR) 0.05 mg/24hr patch, Place 1 patch (0.05 mg total) onto the skin once a week., Disp: 12 patch, Rfl: 3   latanoprost (XALATAN) 0.005 % ophthalmic solution, Place 1 drop into both eyes at bedtime., Disp: , Rfl:    lisinopril (ZESTRIL) 10 MG tablet, TAKE 1 TABLET BY MOUTH TWICE A DAY, Disp: 180 tablet, Rfl: 1   Multiple Vitamins-Minerals (DAILY MULTIVITAMIN PO), Take 1 tablet by mouth daily., Disp: , Rfl:    rosuvastatin (CRESTOR) 5 MG tablet, Take 1 tablet (5 mg total) by mouth every evening., Disp: 90 tablet, Rfl: 1   vitamin B-12 (CYANOCOBALAMIN) 500 MCG tablet, Take 500 mcg by mouth daily., Disp: , Rfl:    Medications ordered in this encounter:  No orders of the defined types were placed in this encounter.    *If you need refills on other medications prior to your next appointment, please contact your pharmacy*  Follow-Up: Call back or seek an in-person evaluation if the symptoms worsen or if the condition fails to improve as anticipated.  [place] [phone]  Other Instructions It was recommended that you have a further evaluation performed to determine the rate and rhythm of your heart. This is best performed in the emergency room, but you requested another option in which case EMS coming to your home was recommended. Should you develop chest pain or shortness of breath, you must be seen in the ER immediately.   If you have been instructed to have an in-person evaluation today at a local Urgent Care facility, please use the link below. It will take you to a list of all of our available [facility], including address, phone number and hours of operation. Please do not delay care.  [facility]  If you or a family member do not have a primary care provider, use the link below to schedule a visit and establish care. When you choose a [facility] primary care physician or advanced practice provider, you gain a long-term partner in health. Find a Primary Care Provider  Learn more about 's in-office and virtual care options: [facility]

## 2023-03-01 NOTE — Progress Notes (Signed)
Virtual Visit Consent   Jacqueline Hunt, you are scheduled for a virtual visit with a [facility] provider today. Just as with appointments in the office, your consent must be obtained to participate. Your consent will be active for this visit and any virtual visit you may have with one of our providers in the next 365 days. If you have a MyChart account, a copy of this consent can be sent to you electronically.  As this is a virtual visit, video technology does not allow for your provider to perform a traditional examination. This may limit your provider's ability to fully assess your condition. If your provider identifies any concerns that need to be evaluated in person or the need to arrange testing (such as labs, EKG, etc.), we will make arrangements to do so. Although advances in technology are sophisticated, we cannot ensure that it will always work on either your end or our end. If the connection with a video visit is poor, the visit may have to be switched to a telephone visit. With either a video or telephone visit, we are not always able to ensure that we have a secure connection.  By engaging in this virtual visit, you consent to the provision of healthcare and authorize for your insurance to be billed (if applicable) for the services provided during this visit. Depending on your insurance coverage, you may receive a charge related to this service.  I need to obtain your verbal consent now. Are you willing to proceed with your visit today? [name] has provided verbal consent on 03/01/2023 for a virtual visit (video or telephone). Chaney Malling, PA  Date: 03/01/2023 9:05 PM  Virtual Visit via Video Note   I, [name], connected with  Jacqueline Hunt  ([id number], Jun 23, 1928) on 03/01/23 at  7:00 PM EDT by a video-enabled telemedicine application and verified that I am speaking with the correct person using two identifiers.  Location: Patient: Virtual Visit Location Patient:  Home Provider: Virtual Visit Location Provider: Home Office   I discussed the limitations of evaluation and management by telemedicine and the availability of in person appointments. The patient expressed understanding and agreed to proceed.    History of Present Illness: Jacqueline Hunt is a 89 y.o. who identifies as a female who was assigned female at birth, and is being seen today for tachycardia.  HPI: 89yo female presents today requesting advice regarding her heart rate. She admits to feeling slightly anxious as her daughter is coming to visit from [place]. She reports that last time her daughter came, she was in the hospital with a brain tumor. She states that her apple watch has notified her several times throughout the day that her heart rate is >100. She states her watch does not tell her an exact number so is uncertain what the maximum hear rate has been, just notes that it has been intermittently >100 at rest today. She states that when she gets the notification of tachycardia, she feels shakey as well and sweaty in her neck. She reports chronic SOB secondary to partial lobectomy of her lung, denies worsening SOB today. No CP or palpitations. Her watch is unable to determine if she is in an irregular rhythm. Pt denies hx of arrhythmias.     Problems:  Patient Active Problem List   Diagnosis Date Noted   Neck pain 12/20/2022   Central retinal vein occlusion with macular edema of left eye 08/14/2022   Neuroendocrine carcinoma metastatic to liver 11/22/2021  Cough 05/31/2021   Wrist pain, acute 04/19/2021   Malignant neoplasm of upper lobe of right lung 02/01/2021   Herpes simplex 02/01/2021   Benign neoplasm of colon 02/01/2021   Malignant neoplasm of brain 02/01/2021   Parathyroid adenoma 02/01/2021   Adenocarcinoma of right lung, stage 1 09/21/2020   History of lung cancer 08/30/2020   Aortic atherosclerosis 08/30/2020   Senile osteoporosis 08/30/2020   History of wrist fracture  08/30/2020   High blood pressure 08/30/2020   History of meningioma 08/30/2020   Primary osteoarthritis involving multiple joints 08/30/2020   Primary open angle glaucoma (POAG) of both eyes, mild stage 08/30/2020   Drug-induced constipation 07/14/2019   Hyperlipidemia 06/25/2019   Multiple thyroid nodules 06/25/2019   Lung nodule 06/10/2019   ASNHL (asymmetrical sensorineural hearing loss) 07/26/2018   Osteoarthritis 11/29/2017   Hot flashes due to surgical menopause 04/12/2017   Skin macule 03/16/2017   Benign nevus 09/22/2016   Seborrheic keratoses 09/22/2016   Skin cancer screening 09/22/2016   Primary hyperparathyroidism 07/18/2016   Rheumatoid pannus of cervical spine 11/10/2015   History of total hysterectomy with bilateral salpingo-oophorectomy (BSO) 11/26/2014   Mild cognitive disorder 11/26/2014   Osteopenia 11/26/2014   Restless leg 11/26/2014   Insomnia 12/24/2012   Obstructive sleep apnea (adult) (pediatric) 12/24/2012    Allergies: No Known Allergies Medications:  Current Outpatient Medications:    aspirin EC 81 MG tablet, Take 81 mg by mouth daily., Disp: , Rfl:    Cholecalciferol (VITAMIN D3 PO), Take 500 mg by mouth daily., Disp: , Rfl:    Doxylamine Succinate, Sleep, (UNISOM PO), Take by mouth., Disp: , Rfl:    estradiol (CLIMARA - DOSED IN MG/24 HR) 0.05 mg/24hr patch, Place 1 patch (0.05 mg total) onto the skin once a week., Disp: 12 patch, Rfl: 3   latanoprost (XALATAN) 0.005 % ophthalmic solution, Place 1 drop into both eyes at bedtime., Disp: , Rfl:    lisinopril (ZESTRIL) 10 MG tablet, TAKE 1 TABLET BY MOUTH TWICE A DAY, Disp: 180 tablet, Rfl: 1   Multiple Vitamins-Minerals (DAILY MULTIVITAMIN PO), Take 1 tablet by mouth daily., Disp: , Rfl:    rosuvastatin (CRESTOR) 5 MG tablet, Take 1 tablet (5 mg total) by mouth every evening., Disp: 90 tablet, Rfl: 1   vitamin B-12 (CYANOCOBALAMIN) 500 MCG tablet, Take 500 mcg by mouth daily., Disp: , Rfl:    Observations/Objective: Patient is well-developed, well-nourished in no acute distress.  Resting comfortably in chair at home.  Head is normocephalic, atraumatic.  No labored breathing. No SOB or dyspnea. No accessory muscle use Speech is clear and coherent with logical content.  Patient is alert and oriented at baseline.    Assessment and Plan: 1. Tachycardia  Due to patients tachycardia, shaking, and sweats, I recommended further evaluation. She did not want to head to the ER as she states her daughter arrives very soon this evening from [place]. We discussed calling EMS to come to her house and perform an EKG at the minimum to further evaluate her actual heart rate and rhythm, to determine if this was something that would require workup in the hospital. Pt states understanding and will call EMS to perform EKG at her house.  Follow Up Instructions: I discussed the assessment and treatment plan with the patient. The patient was provided an opportunity to ask questions and all were answered. The patient agreed with the plan and demonstrated an understanding of the instructions.  A copy of instructions were sent to  the patient via MyChart unless otherwise noted below.    The patient was advised to call back or seek an in-person evaluation if the symptoms worsen or if the condition fails to improve as anticipated.  Time:  I spent 11 minutes with the patient via telehealth technology discussing the above problems/concerns.    [place], PA

## 2023-03-14 ENCOUNTER — Ambulatory Visit (INDEPENDENT_AMBULATORY_CARE_PROVIDER_SITE_OTHER): Payer: Medicare HMO | Admitting: Family Medicine

## 2023-03-14 ENCOUNTER — Encounter: Payer: Self-pay | Admitting: Family Medicine

## 2023-03-14 VITALS — BP 142/90 | HR 104 | Temp 97.7°F | Ht 61.0 in | Wt 119.8 lb

## 2023-03-14 DIAGNOSIS — I10 Essential (primary) hypertension: Secondary | ICD-10-CM | POA: Diagnosis not present

## 2023-03-14 DIAGNOSIS — E782 Mixed hyperlipidemia: Secondary | ICD-10-CM

## 2023-03-14 MED ORDER — CARVEDILOL 6.25 MG PO TABS: 6.2500 mg | ORAL_TABLET | Freq: Two times a day (BID) | ORAL | 3 refills | Status: DC

## 2023-03-14 NOTE — Patient Instructions (Signed)
Monitor BP twice a day and call in 1 week with report If BP is more than 160/110, take Lisinopril 5 mg

## 2023-03-14 NOTE — Progress Notes (Signed)
Provider:  Jacalyn Lefevre, MD  Careteam: Patient Care Team: Frederica Kuster, MD as PCP - General (Family Medicine) Edilia Bo (Podiatry)  PLACE OF SERVICE:  [facility]  Advanced Directive information    No Known Allergies  Chief Complaint  Patient presents with   Acute Visit    Patient presents today for elevated bp and heart rate reading.     HPI: Patient is a 89 y.o. female patient presents today with concerns about elevated blood pressure and heart rates.  She has a wrist monitor and we reviewed her blood pressures.  Generally they have been higher than desired and heart rates have been 100-110.  Her only medication is lisinopril 10 mg.  She denies any headache dizziness or chest pain.  She has been on lisinopril for some years She is followed by oncology and has upcoming appointment as she has a history of lung cancer.  Review of Systems:  Review of Systems  Constitutional: Negative.   HENT: Negative.    Respiratory: Negative.    Cardiovascular: Negative.   Musculoskeletal:  Positive for neck pain.  Neurological: Negative.   Psychiatric/Behavioral: Negative.    All other systems reviewed and are negative.   Past Medical History:  Diagnosis Date   Arthritis    Brain tumor    Constipation due to opioid therapy    hospitalized d/t constipation   Dyspnea    High blood pressure    Lung cancer    Osteopenia    Osteoporosis    Parathyroid adenoma    Past Surgical History:  Procedure Laterality Date   brain meningioma excesion     CATARACT EXTRACTION     CESAREAN SECTION     GALLBLADDER SURGERY     gallbladder surgery      LOBECTOMY     TONSILLECTOMY     TONSILLECTOMY     WRIST FRACTURE SURGERY     Social History:   reports that she quit smoking about 60 years ago. Her smoking use included cigarettes. She has a 57.10 pack-year smoking history. She has never used smokeless tobacco. She reports that she does not drink alcohol and does not use  drugs.  History reviewed. No pertinent family history.  Medications: Patient's Medications  New Prescriptions   CARVEDILOL (COREG) 6.25 MG TABLET    Take 1 tablet (6.25 mg total) by mouth 2 (two) times daily with a meal.  Previous Medications   ACETAMINOPHEN (TYLENOL) 650 MG CR TABLET    Take 650 mg by mouth in the morning and at bedtime.   ASPIRIN EC 81 MG TABLET    Take 81 mg by mouth daily.   CHOLECALCIFEROL (VITAMIN D3 PO)    Take 500 mg by mouth daily.   DOXYLAMINE SUCCINATE, SLEEP, (UNISOM PO)    Take by mouth.   ESTRADIOL (CLIMARA - DOSED IN MG/24 HR) 0.05 MG/24HR PATCH    Place 1 patch (0.05 mg total) onto the skin once a week.   LATANOPROST (XALATAN) 0.005 % OPHTHALMIC SOLUTION    Place 1 drop into both eyes at bedtime.   LISINOPRIL (ZESTRIL) 10 MG TABLET    TAKE 1 TABLET BY MOUTH TWICE A DAY   MULTIPLE VITAMINS-MINERALS (DAILY MULTIVITAMIN PO)    Take 1 tablet by mouth daily.   ROSUVASTATIN (CRESTOR) 5 MG TABLET    Take 1 tablet (5 mg total) by mouth every evening.   VITAMIN B-12 (CYANOCOBALAMIN) 500 MCG TABLET    Take 500 mcg by  mouth daily.  Modified Medications   No medications on file  Discontinued Medications   No medications on file    Physical Exam:  Vitals:   03/14/23 1339  BP: (!) 142/90  Pulse: (!) 104  Temp: 97.7 F (36.5 C)  SpO2: 97%  Weight: 119 lb 12.8 oz (54.3 kg)  Height:  (1.549 m)   Body mass index is 22.64 kg/m. Wt Readings from Last 3 Encounters:  03/14/23 119 lb 12.8 oz (54.3 kg)  01/30/23 117 lb 3 oz (53.2 kg)  12/20/22 119 lb (54 kg)    Physical Exam Vitals and nursing note reviewed.  Constitutional:      Appearance: Normal appearance.  HENT:     Mouth/Throat:     Mouth: Mucous membranes are moist.     Pharynx: Oropharynx is clear.  Cardiovascular:     Rate and Rhythm: Normal rate and regular rhythm.  Pulmonary:     Effort: Pulmonary effort is normal.     Breath sounds: Normal breath sounds.  Abdominal:     General:  Bowel sounds are normal.     Palpations: Abdomen is soft.  Musculoskeletal:     Comments: Gait instability; walks with cane  Skin:    General: Skin is warm and dry.  Neurological:     General: No focal deficit present.     Mental Status: She is alert and oriented to person, place, and time.     Labs reviewed: Basic Metabolic Panel: Recent Labs    05/25/22 1243 11/28/22 1002  NA 139 139  K 4.3 4.0  CL 104 106  CO2 31 28  GLUCOSE 76 78  BUN 18 16  CREATININE 0.72 0.70  CALCIUM 9.7 9.4   Liver Function Tests: Recent Labs    05/25/22 1243 11/28/22 1002  AST 19 20  ALT 15 14  ALKPHOS 70 53  BILITOT 0.3 0.5  PROT 6.8 6.8  ALBUMIN 4.1 4.1   No results for input(s): "LIPASE", "AMYLASE" in the last 8760 hours. No results for input(s): "AMMONIA" in the last 8760 hours. CBC: Recent Labs    05/25/22 1243 11/28/22 1002  WBC 4.7 3.9*  NEUTROABS 2.9 2.6  HGB 13.2 13.4  HCT 38.9 40.0  MCV 90.0 90.7  PLT 180 133*   Lipid Panel: No results for input(s): "CHOL", "HDL", "LDLCALC", "TRIG", "CHOLHDL", "LDLDIRECT" in the last 8760 hours. TSH: No results for input(s): "TSH" in the last 8760 hours. A1C: No results found for: "HGBA1C"   Assessment/Plan  1. Benign essential hypertension Will change to beta-blocker, carvedilol 6.25 mg twice daily and recheck in 1 month.  Have asked her to monitor blood pressure at home and let us know if there is no improvement    3. Moderate mixed hyperlipidemia not requiring statin therapy Continues to take rosuvastatin lipids were last assessed in October 2022 so we will recheck today   Jacalyn Lefevre, MD [facility] [phone]

## 2023-03-20 DIAGNOSIS — H401131 Primary open-angle glaucoma, bilateral, mild stage: Secondary | ICD-10-CM | POA: Diagnosis not present

## 2023-03-21 ENCOUNTER — Telehealth: Payer: Self-pay

## 2023-03-21 NOTE — Telephone Encounter (Signed)
Patient called with blood pressure readings. Patient has been getting the following blood pressure readings.   145/98 this morning 152/109 lastnight 158/89 155/99 158/110 141/94 157/102 143/100 154/106 146/94 153/98 153/110 142/89 109/82 163/104 153/109  137/102  Patient states that the medication has helped with her pulse,but not her blood pressure

## 2023-03-21 NOTE — Telephone Encounter (Signed)
Let's be sure she is taking 6.25 mg; if she is, increase to 12.5 mg BID and continue to monitor

## 2023-03-22 DIAGNOSIS — H348121 Central retinal vein occlusion, left eye, with retinal neovascularization: Secondary | ICD-10-CM | POA: Diagnosis not present

## 2023-03-22 DIAGNOSIS — H3562 Retinal hemorrhage, left eye: Secondary | ICD-10-CM | POA: Diagnosis not present

## 2023-03-22 DIAGNOSIS — H34232 Retinal artery branch occlusion, left eye: Secondary | ICD-10-CM | POA: Diagnosis not present

## 2023-03-22 DIAGNOSIS — H43812 Vitreous degeneration, left eye: Secondary | ICD-10-CM | POA: Diagnosis not present

## 2023-03-22 DIAGNOSIS — H401131 Primary open-angle glaucoma, bilateral, mild stage: Secondary | ICD-10-CM | POA: Diagnosis not present

## 2023-03-22 DIAGNOSIS — H35352 Cystoid macular degeneration, left eye: Secondary | ICD-10-CM | POA: Diagnosis not present

## 2023-03-22 DIAGNOSIS — H34812 Central retinal vein occlusion, left eye, with macular edema: Secondary | ICD-10-CM | POA: Diagnosis not present

## 2023-03-25 IMAGING — US US BIOPSY CORE LIVER
1 series · 14 of 25 positions shown · non-contrast
Comparison: none

INDICATION: [AGE] female with a history of small cell lung cancer with new
hepatic lesions concerning for metastatic disease. She presents for
biopsy of 1 of these lesions.

[Series 1: us biopsy mc & wl · 14 of 35 slices shown]
[im 1/35]
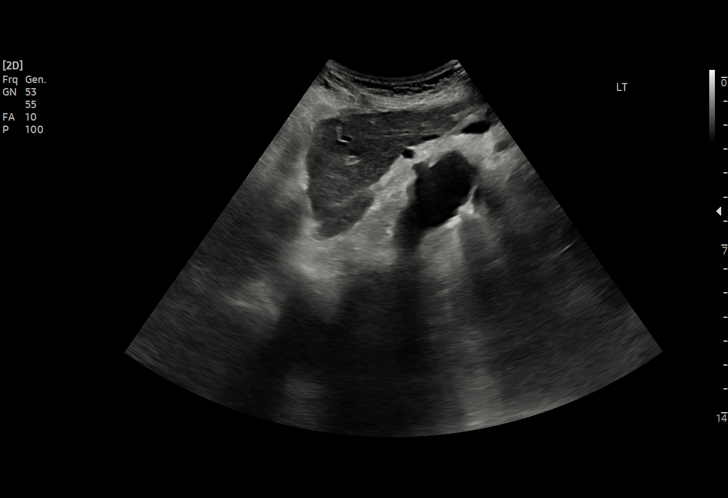
[im 3/35]
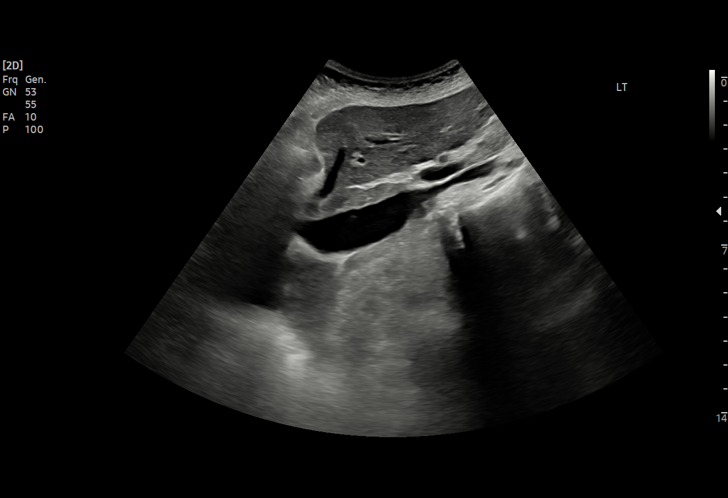
[im 6/35]
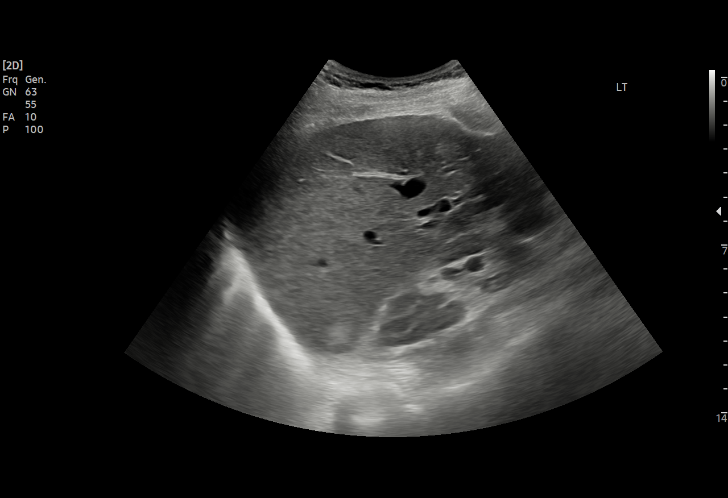
[im 9/35]
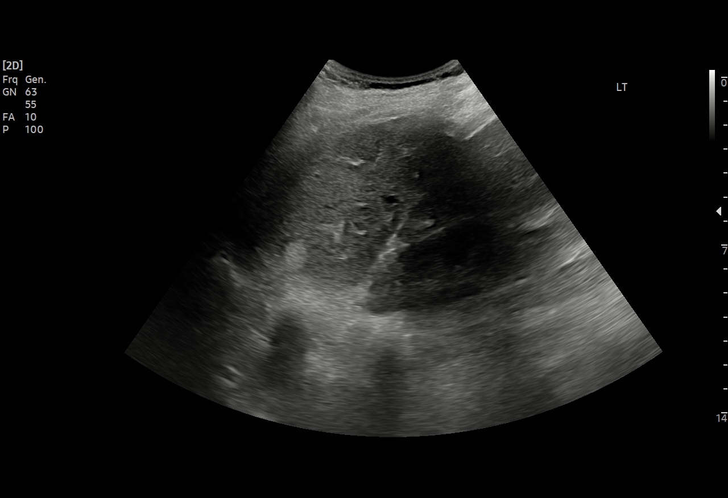
[im 12/35]
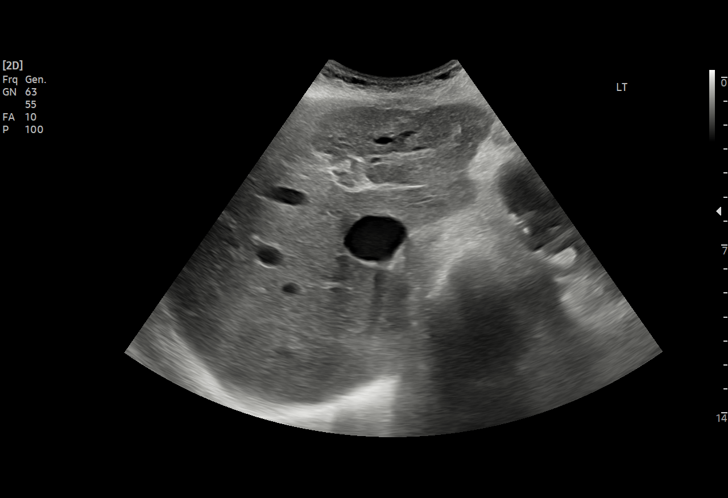
[im 13/35]
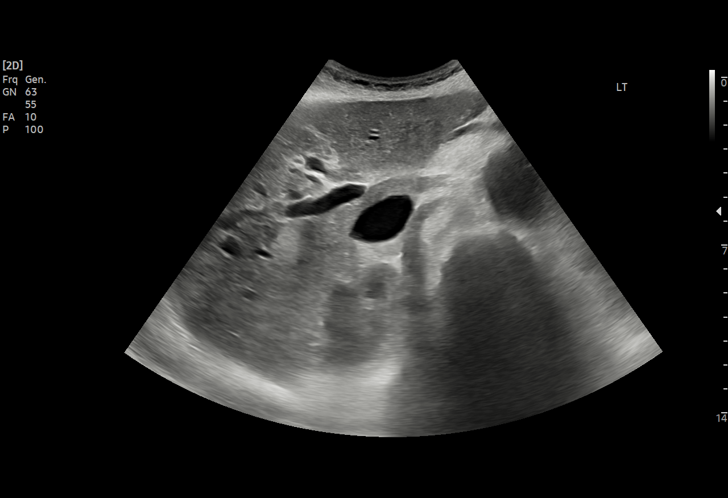
[im 16/35]
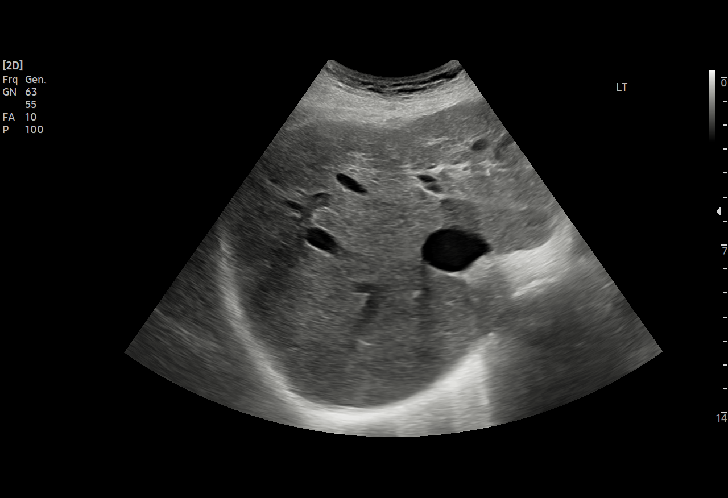
[im 19/35]
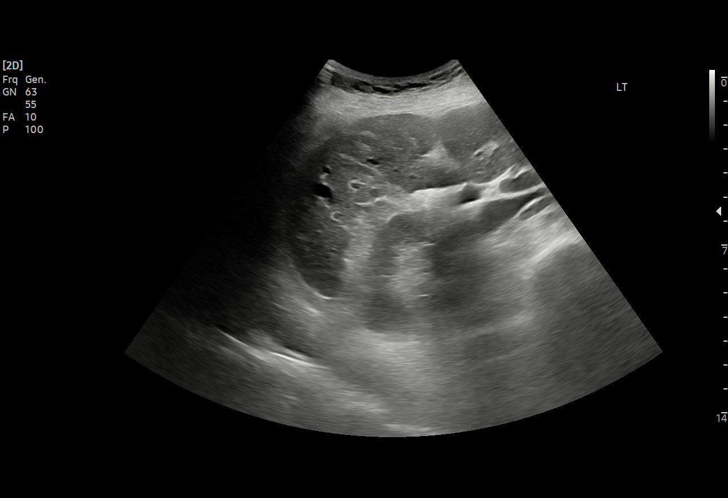
[im 22/35]
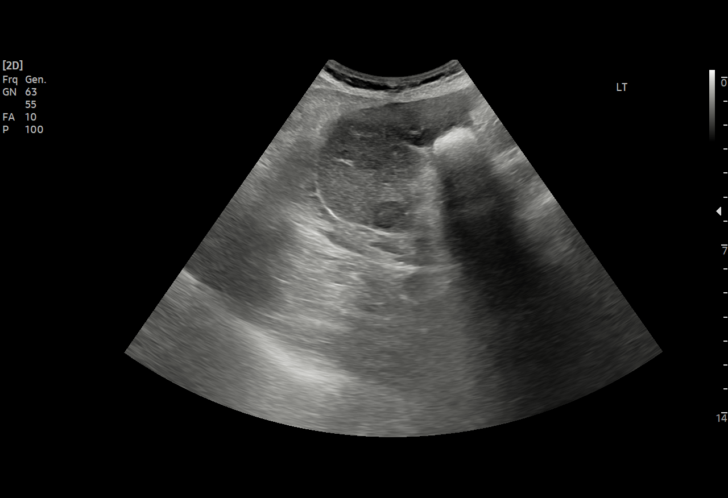
[im 23/35]
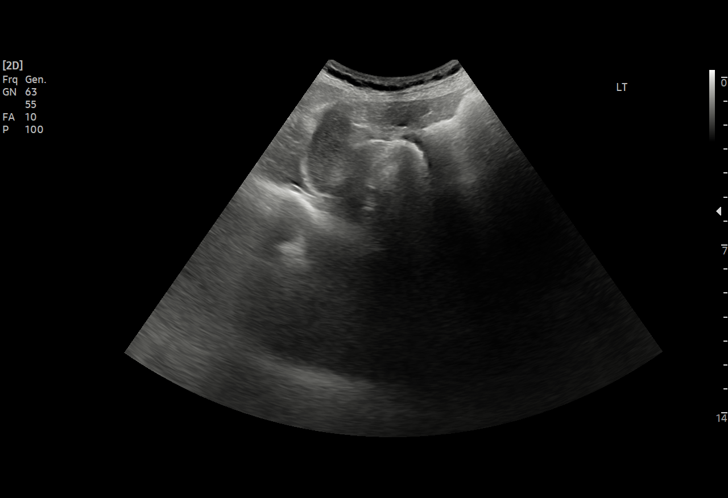
[im 26/35]
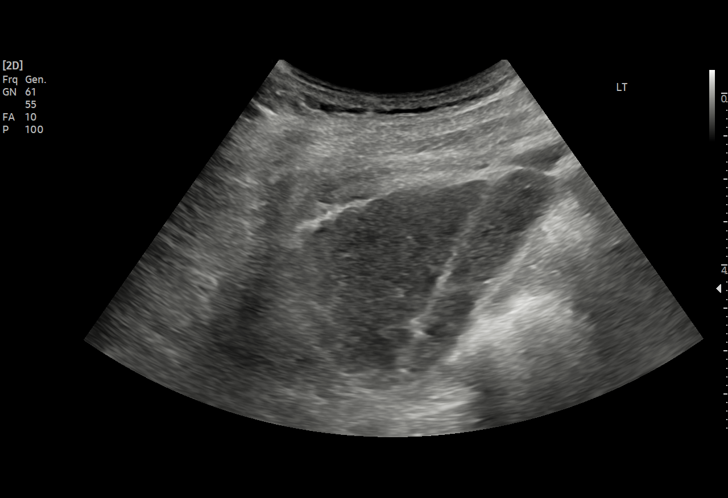
[im 29/35]
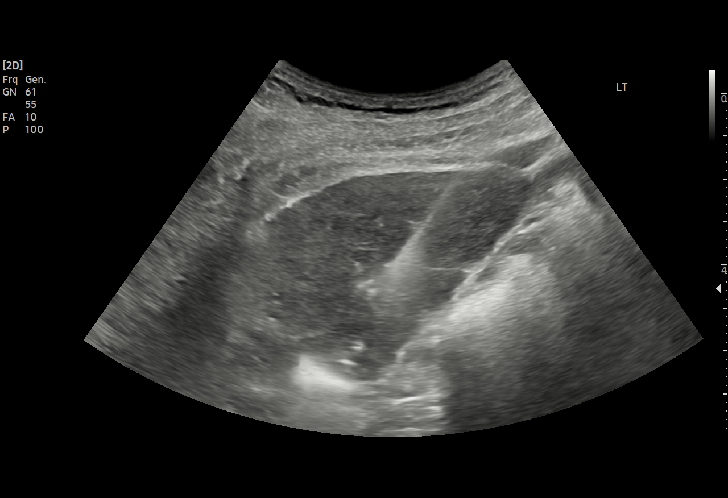
[im 32/35]
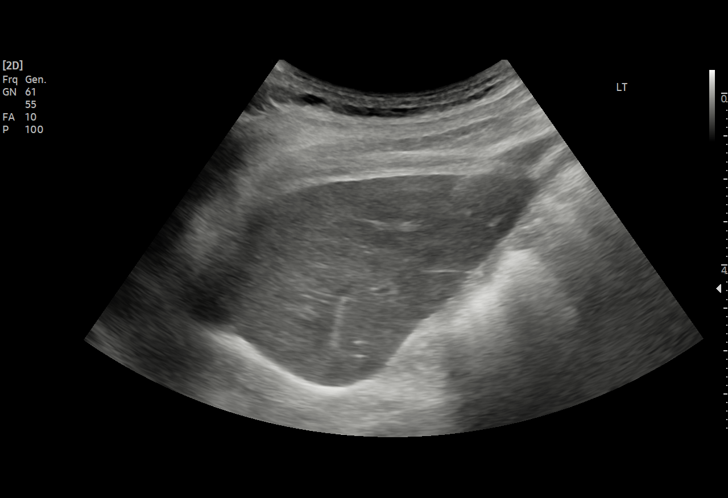
[im 35/35]
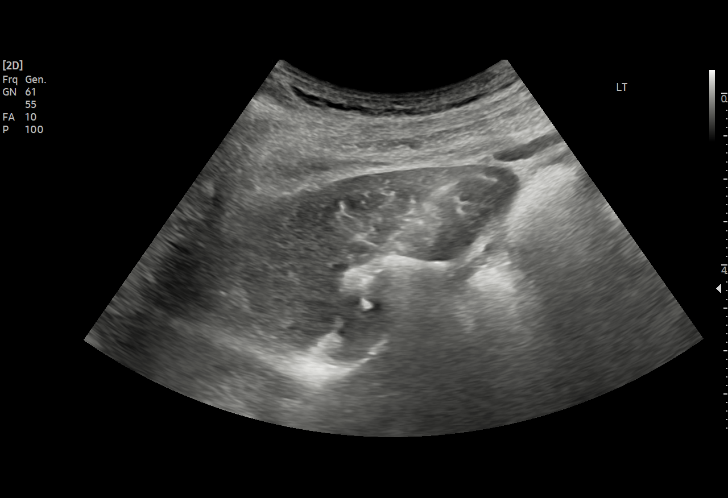

[14 of 25 positions shown; findings below may reference images not displayed]

EXAM:
ULTRASOUND BIOPSY CORE LIVER

MEDICATIONS:
None.

ANESTHESIA/SEDATION:
Moderate (conscious) sedation was employed during this procedure. A
total of Versed 1 mg and Fentanyl 50 mcg was administered
intravenously.

Moderate Sedation Time: 10 minutes. The patient's level of
consciousness and vital signs were monitored continuously by
radiology nursing throughout the procedure under my direct
supervision.

FLUOROSCOPY TIME:  None.

COMPLICATIONS:
None immediate.

PROCEDURE:
Informed written consent was obtained from the patient after a
thorough discussion of the procedural risks, benefits and
alternatives. All questions were addressed. Maximal Sterile Barrier
Technique was utilized including caps, mask, sterile gowns, sterile
gloves, sterile drape, hand hygiene and skin antiseptic. A timeout
was performed prior to the initiation of the procedure.

The right upper quadrant was interrogated with ultrasound. Lesions
demonstrated on MRI are very stealthy by ultrasound. A single lesion
in the posterior aspect of hepatic segment 6 is well visualized. A
suitable skin entry site was selected and marked. The region was
sterilely prepped and draped in the standard fashion using
chlorhexidine skin prep. Local anesthesia was attained by
infiltration with 1% lidocaine. A small dermatotomy was made. Under
real-time sonographic guidance, a 17 gauge introducer needle was
advanced into the margin of the mass. Multiple 18 gauge biopsies
were then coaxially obtained using the BioPince automated biopsy
device. Biopsy samples were placed in formalin and delivered to
pathology for further analysis.

As a 17 gauge introducer needle was removed, the biopsy tract was
embolized with a Gel-Foam slurry. Post biopsy ultrasound imaging
demonstrates no evidence of active hemorrhage or perihepatic
hematoma. The patient tolerated the procedure well.
IMPRESSION: Technically successful ultrasound-guided core biopsy of hepatic
lesion.

Of note, the hepatic lesions are not well seen by ultrasound in
comparison to the MR imaging.

## 2023-03-26 ENCOUNTER — Ambulatory Visit (INDEPENDENT_AMBULATORY_CARE_PROVIDER_SITE_OTHER): Payer: Medicare HMO | Admitting: Adult Health

## 2023-03-26 ENCOUNTER — Encounter: Payer: Self-pay | Admitting: Adult Health

## 2023-03-26 VITALS — BP 152/90 | HR 72 | Temp 97.3°F | Ht 61.0 in | Wt 117.0 lb

## 2023-03-26 DIAGNOSIS — I10 Essential (primary) hypertension: Secondary | ICD-10-CM | POA: Diagnosis not present

## 2023-03-26 MED ORDER — CARVEDILOL 12.5 MG PO TABS: 12.5000 mg | ORAL_TABLET | Freq: Two times a day (BID) | ORAL | 3 refills | Status: DC

## 2023-03-26 NOTE — Patient Instructions (Signed)
We will increase your coreg to 12.5 mg twice daily Use your current supply of 6.25 mg take 2 tablets twice daily  Continue to take lisinopril 10 mg twice daily

## 2023-03-26 NOTE — Progress Notes (Signed)
Location:  [facility]   Place of Service:   clinic    CODE STATUS: full   No Known Allergies  Chief Complaint  Patient presents with   Acute Visit    Elevated blood pressure     HPI:  This is a follow up visit for her hypertension. Her readings remain elevated 140-150's / 80-90's. She denies any headaches; no blurring of her vision. She does have intermittent dizziness. The other day she was unable to cross the street to a coffee shop for fear of her dizziness. She denies any chest pain or shortness of breath her pulse is in the 70's.  She is presently taking lisinopril 10 mg twice daily and coreg 6.25 mg twice daily.   Past Medical History:  Diagnosis Date   Arthritis    Brain tumor (HCC)    Constipation due to opioid therapy    hospitalized d/t constipation   Dyspnea    High blood pressure    Lung cancer (HCC)    Osteopenia    Osteoporosis    Parathyroid adenoma     Past Surgical History:  Procedure Laterality Date   brain meningioma excesion     CATARACT EXTRACTION     CESAREAN SECTION     GALLBLADDER SURGERY     gallbladder surgery      LOBECTOMY     TONSILLECTOMY     TONSILLECTOMY     WRIST FRACTURE SURGERY      Social History   Socioeconomic History   Marital status: Divorced    Spouse name: Not on file   Number of children: Not on file   Years of education: Not on file   Highest education level: Doctorate  Occupational History   Not on file  Tobacco Use   Smoking status: Former    Packs/day: 1.00    Years: 57.10    Additional pack years: 0.00    Total pack years: 57.10    Types: Cigarettes    Quit date: 1964    Years since quitting: 60.3   Smokeless tobacco: Never  Vaping Use   Vaping Use: Never used  Substance and Sexual Activity   Alcohol use: Never   Drug use: Never   Sexual activity: Not Currently  Other Topics Concern   Not on file  Social History Narrative   Diet: Nothing Special      Do you drink/ eat things with  caffeine?  Coffe      Marital status:   Divorced                            What year were you married ? 1951      Do you live in a house, apartment,assistred living, condo, trailer, etc.)? Apartment      Is it one or more stories? First Floor      How many persons live in your home ? One      Do you have any pets in your home ?(please list) Cat      Highest Level of education completed: PhD      Current or past profession:  Former Public house manager + Professor , Social Work      Do you exercise?      Walk                        Type & how often  Just Power Walk Daily  ADVANCED DIRECTIVES (Please bring copies)      Do you have a living will? Yes      Do you have a DNR form?    Yes                   If not, do you want to discuss one?       Do you have signed POA?HPOA forms?  Yes               If so, please bring to your appointment      FUNCTIONAL STATUS- To be completed by Spouse / child / Staff       Do you have difficulty bathing or dressing yourself ? No      Do you have difficulty preparing food or eating ? No      Do you have difficulty managing your mediation ? No      Do you have difficulty managing your finances ? No      Do you have difficulty affording your medication ? No      Social Determinants of Health   Financial Resource Strain: Low Risk  (03/10/2023)   Overall Financial Resource Strain (CARDIA)    Difficulty of Paying Living Expenses: Not hard at all  Food Insecurity: No Food Insecurity (03/10/2023)   Hunger Vital Sign    Worried About Running Out of Food in the Last Year: Never true    Ran Out of Food in the Last Year: Never true  Transportation Needs: No Transportation Needs (03/10/2023)   PRAPARE - Administrator, Civil Service (Medical): No    Lack of Transportation (Non-Medical): No  Physical Activity: Sufficiently Active (03/10/2023)   Exercise Vital Sign    Days of Exercise per Week: 3 days    Minutes of Exercise per Session: 80 min   Stress: Stress Concern Present (03/10/2023)   [employer] of Occupational Health - Occupational Stress Questionnaire    Feeling of Stress : Rather much  Social Connections: Moderately Isolated (03/10/2023)   Social Connection and Isolation Panel [NHANES]    Frequency of Communication with Friends and Family: More than three times a week    Frequency of Social Gatherings with Friends and Family: Once a week    Attends Religious Services: Never    Database administrator or Organizations: Yes    Attends Engineer, structural: More than 4 times per year    Marital Status: Divorced  Catering manager Violence: Not on file   No family history on file.    VITAL SIGNS BP (!) 152/90 (BP Location: Left Arm, Patient Position: Sitting, Cuff Size: Normal)   Pulse 72   Temp (!) 97.3 F (36.3 C) (Temporal)   Ht 5\' 1"  (1.549 m)   Wt 117 lb (53.1 kg)   SpO2 97%   BMI 22.11 kg/m   Outpatient Encounter Medications as of 03/26/2023  Medication Sig   acetaminophen (TYLENOL) 650 MG CR tablet Take 650 mg by mouth in the morning and at bedtime.   aspirin EC 81 MG tablet Take 81 mg by mouth daily.   carvedilol (COREG) 6.25 MG tablet Take 1 tablet (6.25 mg total) by mouth 2 (two) times daily with a meal.   Cholecalciferol (VITAMIN D3 PO) Take 500 mg by mouth daily.   Doxylamine Succinate, Sleep, (UNISOM PO) Take by mouth.   estradiol (CLIMARA - DOSED IN MG/24 HR) 0.05 mg/24hr patch Place 1 patch (0.05 mg total) onto  the skin once a week.   latanoprost (XALATAN) 0.005 % ophthalmic solution Place 1 drop into both eyes at bedtime.   lisinopril (ZESTRIL) 10 MG tablet TAKE 1 TABLET BY MOUTH TWICE A DAY   Multiple Vitamins-Minerals (DAILY MULTIVITAMIN PO) Take 1 tablet by mouth daily.   rosuvastatin (CRESTOR) 5 MG tablet Take 1 tablet (5 mg total) by mouth every evening.   vitamin B-12 (CYANOCOBALAMIN) 500 MCG tablet Take 500 mcg by mouth daily.   No facility-administered encounter medications  on file as of 03/26/2023.     SIGNIFICANT DIAGNOSTIC EXAMS  Review of Systems  Constitutional:  Negative for malaise/fatigue.  Respiratory:  Negative for cough and shortness of breath.   Cardiovascular:  Negative for chest pain, palpitations and leg swelling.  Gastrointestinal:  Negative for abdominal pain, constipation and heartburn.  Musculoskeletal:  Negative for back pain, joint pain and myalgias.  Skin: Negative.   Neurological:  Positive for dizziness.       Is intermittent in nature has had for a long time.   Psychiatric/Behavioral:  The patient is not nervous/anxious.     Physical Exam Constitutional:      General: She is not in acute distress.    Appearance: She is well-developed. She is not diaphoretic.  Neck:     Thyroid: No thyromegaly.  Cardiovascular:     Rate and Rhythm: Normal rate and regular rhythm.     Pulses: Normal pulses.     Heart sounds: Normal heart sounds.  Pulmonary:     Effort: Pulmonary effort is normal. No respiratory distress.     Breath sounds: Normal breath sounds.  Abdominal:     General: Bowel sounds are normal. There is no distension.     Palpations: Abdomen is soft.     Tenderness: There is no abdominal tenderness.  Musculoskeletal:        General: Normal range of motion.     Cervical back: Neck supple.     Right lower leg: No edema.     Left lower leg: No edema.  Lymphadenopathy:     Cervical: No cervical adenopathy.  Skin:    General: Skin is warm and dry.  Neurological:     Mental Status: She is alert and oriented to person, place, and time.  Psychiatric:        Mood and Affect: Mood normal.        ASSESSMENT/ PLAN:  TODAY  Primary hypertension: blood pressures are not adequately controlled. Will continue lisinopril 10 mg twice daily and will begin coreg 12.5 mg twice daily; she will need to return to clinic in 2 weeks for follow up.    Synthia Innocent NP [facility]   call [phone]

## 2023-04-09 ENCOUNTER — Ambulatory Visit: Payer: Medicare HMO | Admitting: Family

## 2023-04-11 ENCOUNTER — Encounter: Payer: Self-pay | Admitting: Family

## 2023-04-11 ENCOUNTER — Ambulatory Visit (INDEPENDENT_AMBULATORY_CARE_PROVIDER_SITE_OTHER): Payer: Medicare HMO | Admitting: Family

## 2023-04-11 VITALS — BP 110/80 | HR 88 | Temp 97.1°F | Resp 16 | Ht 61.0 in | Wt 117.0 lb

## 2023-04-11 DIAGNOSIS — R2681 Unsteadiness on feet: Secondary | ICD-10-CM | POA: Diagnosis not present

## 2023-04-11 DIAGNOSIS — R000 Tachycardia, unspecified: Secondary | ICD-10-CM | POA: Diagnosis not present

## 2023-04-11 DIAGNOSIS — I10 Essential (primary) hypertension: Secondary | ICD-10-CM | POA: Diagnosis not present

## 2023-04-11 NOTE — Progress Notes (Signed)
Provider: Maye Parkinson FNP-C  Frederica Kuster, MD  Patient Care Team: Frederica Kuster, MD as PCP - General (Family Medicine) Edilia Bo [facility])  Extended Emergency Contact Information Primary Emergency Contact: [facility] Address: [address]          [place], [place] [address] Darden Amber of [place] Home Phone: [phone] Work Phone: [phone] Mobile Phone: [phone] Relation: Son Secondary Emergency Contact: Agustina Caroli Address: [address]          [place], [place] [address] Darden Amber of [place] Home Phone: [phone] Work Phone: [phone] Mobile Phone: [phone] Relation: Relative  Code Status:  Full Code  Goals of care: Advanced Directive information    08/21/2022    3:11 PM  Advanced Directives  Does Patient Have a Medical Advance Directive? Yes  Type of Estate agent of [place];Living will;Out of facility DNR (pink MOST or yellow form)  Does patient want to make changes to medical advance directive? No - Patient declined  Copy of Healthcare Power of Attorney in Chart? No - copy requested     Chief Complaint  Patient presents with   Follow-up    Patient is here for a 2 week follow up for hypertension bp was not elevated today will re-check.    HPI:  Pt is a 89 y.o. female seen today for an acute visit for 2 weeks follow up for high blood pressure.she was here on 03/26/2023 seen by Thyra Breed for high blood pressure reading was 150/90.Home readings were 140 -150's / 80's-90's.Also had intermittent dizziness.Her Coreg was increased from 6.25 mg twice daily to 12.5 mg tablet  twice daily. She brought in her B/p machine today.B/p ranging in the 120's/80's -140's/80's with some SBP in the 150's and Dbp in the 100's.HR in the 70's -80's.states B/p readings have improved.she denies any headache,dizziness,vision changes,fatigue,chest tightness,palpitation,chest pain or shortness of  breath.  States her apple watch sometimes shows her EKG in Afib.states has never had Afib though was following up in [place] with a cardiologist. Also already has an upcoming appointment with a cardiologist here in town next month June,10 th,2024.  Also tells me usually walks 1/2 a mile at the [facility] with a Friend three times per week.Also walks at her complex in the pool back and forth in 4 feet deep.she is concerned that every time after walking in the pool upon getting out she is unable to take another step.Feels unsteady on her feet.states her friends now days gets her a chair to sit at least for 30 minutes prior to taking another step.this has helped.states no changes in the pool chemicals.has been going to the same pool for a long time. She denies any feeling of passing out,palpitation or chest pain.   Past Medical History:  Diagnosis Date   Arthritis    Brain tumor (HCC)    Constipation due to opioid therapy    hospitalized d/t constipation   Dyspnea    High blood pressure    Lung cancer (HCC)    Osteopenia    Osteoporosis    Parathyroid adenoma    Past Surgical History:  Procedure Laterality Date   brain meningioma excesion     CATARACT EXTRACTION     CESAREAN SECTION     GALLBLADDER SURGERY     gallbladder surgery      LOBECTOMY     TONSILLECTOMY     TONSILLECTOMY     WRIST FRACTURE SURGERY      No Known Allergies  Outpatient Encounter Medications as of 04/11/2023  Medication Sig   acetaminophen (TYLENOL) 650 MG CR tablet Take 650 mg by mouth in the morning and at bedtime.   aspirin EC 81 MG tablet Take 81 mg by mouth daily.   carvedilol (COREG) 12.5 MG tablet Take 1 tablet (12.5 mg total) by mouth 2 (two) times daily with a meal.   Cholecalciferol (VITAMIN D3 PO) Take 500 mg by mouth daily.   Doxylamine Succinate, Sleep, (UNISOM PO) Take by mouth.   estradiol (CLIMARA - DOSED IN MG/24 HR) 0.05 mg/24hr patch Place 1 patch (0.05 mg total) onto the skin once a week.    latanoprost (XALATAN) 0.005 % ophthalmic solution Place 1 drop into both eyes at bedtime.   lisinopril (ZESTRIL) 10 MG tablet TAKE 1 TABLET BY MOUTH TWICE A DAY   Multiple Vitamins-Minerals (DAILY MULTIVITAMIN PO) Take 1 tablet by mouth daily.   rosuvastatin (CRESTOR) 5 MG tablet Take 1 tablet (5 mg total) by mouth every evening.   vitamin B-12 (CYANOCOBALAMIN) 500 MCG tablet Take 500 mcg by mouth daily.   No facility-administered encounter medications on file as of 04/11/2023.    Review of Systems  Constitutional:  Negative for appetite change, chills, fatigue, fever and unexpected weight change.  Eyes:  Negative for pain, discharge, redness, itching and visual disturbance.  Respiratory:  Negative for cough, chest tightness, shortness of breath and wheezing.   Cardiovascular:  Negative for chest pain, palpitations and leg swelling.       Reports Afib sometimes on her apple watch   Gastrointestinal:  Negative for abdominal distention, abdominal pain, constipation, diarrhea, nausea and vomiting.  Endocrine: Negative for cold intolerance, heat intolerance, polydipsia, polyphagia and polyuria.  Genitourinary:  Negative for difficulty urinating, dysuria, flank pain, frequency and urgency.  Musculoskeletal:  Positive for gait problem. Negative for arthralgias, back pain, joint swelling, myalgias, neck pain and neck stiffness.  Skin:  Negative for color change, pallor and rash.  Neurological:  Negative for dizziness, syncope, speech difficulty, weakness, light-headedness, numbness and headaches.  Hematological:  Does not bruise/bleed easily.    Immunization History  Administered Date(s) Administered   Hepatitis A, Adult 01/09/2018, 07/09/2018   Influenza Split 09/07/2011, 10/29/2012, 08/26/2014   Influenza, High Dose Seasonal PF 08/26/2014, 09/24/2017, 08/14/2021   Influenza-Unspecified 09/21/2009, 10/28/2019, 08/31/2020, 08/17/2022   Moderna Covid-19 Vaccine Bivalent Booster 54yrs & up  03/31/2021   Moderna Sars-Covid-2 Vaccination 04/01/2022   PFIZER Comirnaty(Gray Top)Covid-19 Tri-Sucrose Vaccine 03/08/2021, 08/14/2021   PFIZER(Purple Top)SARS-COV-2 Vaccination 12/16/2019, 01/05/2020, 08/17/2020, 03/08/2021   Pfizer Covid-19 Vaccine Bivalent Booster 5y-11y 08/17/2022   Pneumococcal Conjugate-13 02/25/2014   Pneumococcal Polysaccharide-23 01/15/2007   Rsv, Bivalent, Protein Subunit Rsvpref,pf Verdis Frederickson) 08/31/2022   Td,absorbed, Preservative Free, Adult Use, Lf Unspecified 11/29/2017   Tdap 11/29/2017   Zoster Recombinat (Shingrix) 03/26/2017, 06/24/2017   Zoster, Live 11/06/2011   Pertinent  Health Maintenance Due  Topic Date Due   INFLUENZA VACCINE  06/28/2023   DEXA SCAN  Completed      08/21/2022    3:16 PM 12/04/2022    2:58 PM 12/20/2022   11:24 AM 03/14/2023    1:39 PM 03/26/2023    3:12 PM  Fall Risk  Falls in the past year? 0 0 0 0 0  Was there an injury with Fall? 0 0 0 0 0  Fall Risk Category Calculator 0 0 0 0 0  Fall Risk Category (Retired) Low Low     (RETIRED) Patient Fall Risk Level Low fall risk  Patient at Risk for Falls Due to No Fall Risks History of fall(s) No Fall Risks No Fall Risks No Fall Risks  Fall risk Follow up Falls evaluation completed Falls evaluation completed Falls evaluation completed Falls evaluation completed Falls evaluation completed   Functional Status Survey:    Vitals:   04/11/23 1444  BP: 110/80  Pulse: 88  Resp: 16  Temp: (!) 97.1 F (36.2 C)  SpO2: 97%  Weight: 117 lb (53.1 kg)  Height: 5\' 1"  (1.549 m)   Body mass index is 22.11 kg/m. Physical Exam Vitals reviewed.  Constitutional:      General: She is not in acute distress.    Appearance: Normal appearance. She is normal weight. She is not ill-appearing or diaphoretic.  HENT:     Head: Normocephalic.     Mouth/Throat:     Mouth: Mucous membranes are moist.     Pharynx: Oropharynx is clear. No oropharyngeal exudate or posterior oropharyngeal  erythema.  Eyes:     General: No scleral icterus.       Right eye: No discharge.        Left eye: No discharge.     Extraocular Movements: Extraocular movements intact.     Conjunctiva/sclera: Conjunctivae normal.     Pupils: Pupils are equal, round, and reactive to light.  Neck:     Vascular: No carotid bruit.  Cardiovascular:     Rate and Rhythm: Normal rate and regular rhythm.     Pulses: Normal pulses.     Heart sounds: Normal heart sounds. No murmur heard.    No friction rub. No gallop.  Pulmonary:     Effort: Pulmonary effort is normal. No respiratory distress.     Breath sounds: Normal breath sounds. No wheezing, rhonchi or rales.  Chest:     Chest wall: No tenderness.  Abdominal:     General: Bowel sounds are normal. There is no distension.     Palpations: Abdomen is soft. There is no mass.     Tenderness: There is no abdominal tenderness. There is no right CVA tenderness, left CVA tenderness, guarding or rebound.  Musculoskeletal:        General: No swelling or tenderness. Normal range of motion.     Cervical back: Normal range of motion. No rigidity or tenderness.     Right lower leg: No edema.     Left lower leg: No edema.     Comments: Ambulates with a cane   Lymphadenopathy:     Cervical: No cervical adenopathy.  Skin:    General: Skin is warm and dry.     Coloration: Skin is not pale.     Findings: No erythema.  Neurological:     Mental Status: She is alert and oriented to person, place, and time.     Motor: No weakness.     Coordination: Coordination normal.     Gait: Gait abnormal.  Psychiatric:        Mood and Affect: Mood normal.        Speech: Speech normal.        Behavior: Behavior normal.     Labs reviewed: Recent Labs    05/25/22 1243 11/28/22 1002  NA 139 139  K 4.3 4.0  CL 104 106  CO2 31 28  GLUCOSE 76 78  BUN 18 16  CREATININE 0.72 0.70  CALCIUM 9.7 9.4   Recent Labs    05/25/22 1243 11/28/22 1002  AST 19 20  ALT 15 14  ALKPHOS 70 53  BILITOT 0.3 0.5  PROT 6.8 6.8  ALBUMIN 4.1 4.1   Recent Labs    05/25/22 1243 11/28/22 1002  WBC 4.7 3.9*  NEUTROABS 2.9 2.6  HGB 13.2 13.4  HCT 38.9 40.0  MCV 90.0 90.7  PLT 180 133*   No results found for: "TSH" No results found for: "HGBA1C" Lab Results  Component Value Date   CHOL 140 09/13/2021   HDL 69 09/13/2021   LDLCALC 57 09/13/2021   TRIG 68 09/13/2021   CHOLHDL 2.0 09/13/2021    Significant Diagnostic Results in last 30 days:  No results found.  Assessment/Plan  1. Benign essential hypertension B/p well controlled this visit though some of her home readings runs in the 150's /100 but majority are in the 120's/80's - 140's/80's. - advised to continue to monitor B/p at home  - continue on Lisinopril and Coreg   2. Tachycardia Reports Afib on her apple watch  - Ambulatory referral to Home Health - EKG 12-Lead indicates sinus rhythm with old anterior infarct HR 64 b/min.No previous EKG for comparison.  - Has upcoming appointment June 10 th with cardiologist.   3. Unsteady gait Worst after getting out of the swimming pool.new onset.suspect possible fatigue or could be related to her B/p or Afib as she reports on her apple watch when resting. Will have work with [facility]   Family/ staff Communication: Reviewed plan of care with patient verbalized understanding.   Labs/tests ordered: - EKG 12-Lead   Next Appointment: Return if symptoms worsen or fail to improve.   Caesar Bookman, NP

## 2023-04-13 ENCOUNTER — Other Ambulatory Visit: Payer: Self-pay | Admitting: Family Medicine

## 2023-04-13 NOTE — Telephone Encounter (Signed)
Patient has request refill on medication Lisinopril. Patient medication has High Risk Warnings. Medication pend and sent to PCP Hyacinth Meeker Bertram Millard, MD

## 2023-04-26 DIAGNOSIS — I10 Essential (primary) hypertension: Secondary | ICD-10-CM | POA: Diagnosis not present

## 2023-04-26 DIAGNOSIS — M810 Age-related osteoporosis without current pathological fracture: Secondary | ICD-10-CM | POA: Diagnosis not present

## 2023-04-26 DIAGNOSIS — Z7982 Long term (current) use of aspirin: Secondary | ICD-10-CM | POA: Diagnosis not present

## 2023-04-27 ENCOUNTER — Telehealth: Payer: Self-pay

## 2023-04-27 NOTE — Telephone Encounter (Signed)
Physical Therapist Tish called from [facility] and states that she just did start of care for patient. She would like verbals orders for once a week for 1 week, twice a week for 2 weeks, and once a week for 3 weeks. Verbal orders given.

## 2023-05-07 ENCOUNTER — Encounter: Payer: Self-pay | Admitting: *Deleted

## 2023-05-07 ENCOUNTER — Encounter: Payer: Self-pay | Admitting: Internal Medicine

## 2023-05-07 ENCOUNTER — Ambulatory Visit: Payer: Medicare HMO | Attending: Internal Medicine | Admitting: Internal Medicine

## 2023-05-07 VITALS — BP 152/88 | HR 68 | Ht 60.0 in | Wt 117.4 lb

## 2023-05-07 DIAGNOSIS — R002 Palpitations: Secondary | ICD-10-CM | POA: Diagnosis not present

## 2023-05-07 DIAGNOSIS — I700 Atherosclerosis of aorta: Secondary | ICD-10-CM

## 2023-05-07 DIAGNOSIS — I2510 Atherosclerotic heart disease of native coronary artery without angina pectoris: Secondary | ICD-10-CM

## 2023-05-07 DIAGNOSIS — I10 Essential (primary) hypertension: Secondary | ICD-10-CM

## 2023-05-07 DIAGNOSIS — I2584 Coronary atherosclerosis due to calcified coronary lesion: Secondary | ICD-10-CM

## 2023-05-07 NOTE — Progress Notes (Unsigned)
Patient enrolled for [employer] to ship a 30 day cardiac event monitor to her address on file. 

## 2023-05-07 NOTE — Patient Instructions (Signed)
Medication Instructions:  No Changes In Medications at this time.   *If you need a refill on your cardiac medications before your next appointment, please call your pharmacy*  Lab Work: None Ordered At This Time.   If you have labs (blood work) drawn today and your tests are completely normal, you will receive your results only by: MyChart Message (if you have MyChart) OR A paper copy in the mail If you have any lab test that is abnormal or we need to change your treatment, we will call you to review the results.  Testing/Procedures: Your physician has requested that you have an echocardiogram. Echocardiography is a painless test that uses sound waves to create images of your heart. It provides your doctor with information about the size and shape of your heart and how well your heart's chambers and valves are working. You may receive an ultrasound enhancing agent through an IV if needed to better visualize your heart during the echo.This procedure takes approximately one hour. There are no restrictions for this procedure. This will take place at the [address].    [employer] Cardiac Event Monitor Instructions  Your physician has requested you wear your cardiac event monitor for __30___ days, (1-30). [employer] may call or text to confirm a shipping address. The monitor will be sent to a land address via UPS. [employer] will not ship a monitor to a PO BOX. It typically takes 3-5 days to receive your monitor after it has been enrolled. [employer] will assist with USPS tracking if your package is delayed. The telephone number for [employer] is [phone]. Once you have received your monitor, please review the enclosed instructions. Instruction tutorials can also be viewed under help and settings on the enclosed cell phone. Your monitor has already been registered assigning a specific monitor serial # to you.  Billing and Self Pay Discount Information  [employer] has  been provided the insurance information we had on file for you.  If your insurance has been updated, please call [employer] at [phone] to provide them with your updated insurance information.   [employer] offers a discounted Self Pay option for patients who have insurance that does not cover their cardiac event monitor or patients without insurance.  The discounted cost of a Self Pay Cardiac Event Monitor would be $225.00 , if the patient contacts [employer] at [phone] within 7 days of applying the monitor to make payment arrangements.  If the patient does not contact [employer] within 7 days of applying the monitor, the cost of the cardiac event monitor will be $350.00.  Applying the monitor  Remove cell phone from case and turn it on. The cell phone works as IT consultant and needs to be within [employer] of you at all times. The cell phone will need to be charged on a daily basis. We recommend you plug the cell phone into the enclosed charger at your bedside table every night.  Monitor batteries: You will receive two monitor batteries labelled #1 and #2. These are your recorders. Plug battery #2 onto the second connection on the enclosed charger. Keep one battery on the charger at all times. This will keep the monitor battery deactivated. It will also keep it fully charged for when you need to switch your monitor batteries. A small light will be blinking on the battery emblem when it is charging. The light on the battery emblem will remain on when the battery is fully charged.  Open package of a Monitor  strip. Insert battery #1 into black hood on strip and gently squeeze monitor battery onto connection as indicated in instruction booklet. Set aside while preparing skin.  Choose location for your strip, vertical or horizontal, as indicated in the instruction booklet. Shave to remove all hair from location. There cannot be any lotions, oils, powders, or colognes on skin where monitor is  to be applied. Wipe skin clean with enclosed Saline wipe. Dry skin completely.  Peel paper labeled #1 off the back of the Monitor strip exposing the adhesive. Place the monitor on the chest in the vertical or horizontal position shown in the instruction booklet. One arrow on the monitor strip must be pointing upward. Carefully remove paper labeled #2, attaching remainder of strip to your skin. Try not to create any folds or wrinkles in the strip as you apply it.  Firmly press and release the circle in the center of the monitor battery. You will hear a small beep. This is turning the monitor battery on. The heart emblem on the monitor battery will light up every 5 seconds if the monitor battery in turned on and connected to the patient securely. Do not push and hold the circle down as this turns the monitor battery off. The cell phone will locate the monitor battery. A screen will appear on the cell phone checking the connection of your monitor strip. This may read poor connection initially but change to good connection within the next minute. Once your monitor accepts the connection you will hear a series of 3 beeps followed by a climbing crescendo of beeps. A screen will appear on the cell phone showing the two monitor strip placement options. Touch the picture that demonstrates where you applied the monitor strip.  Your monitor strip and battery are waterproof. You are able to shower, bathe, or swim with the monitor on. They just ask you do not submerge deeper than 3 feet underwater. We recommend removing the monitor if you are swimming in a lake, river, or ocean.  Your monitor battery will need to be switched to a fully charged monitor battery approximately once a week. The cell phone will alert you of an action which needs to be made.  On the cell phone, tap for details to reveal connection status, monitor battery status, and cell phone battery status. The green dots indicates your  monitor is in good status. A red dot indicates there is something that needs your attention.  To record a symptom, click the circle on the monitor battery. In 30-60 seconds a list of symptoms will appear on the cell phone. Select your symptom and tap save. Your monitor will record a sustained or significant arrhythmia regardless of you clicking the button. Some patients do not feel the heart rhythm irregularities. [employer] will notify us of any serious or critical events.  Refer to instruction booklet for instructions on switching batteries, changing strips, the Do not disturb or Pause features, or any additional questions.  Call [employer] at [phone], to confirm your monitor is transmitting and record your baseline. They will answer any questions you may have regarding the monitor instructions at that time.  Returning the monitor to [employer]  Place all equipment back into blue box. Peel off strip of paper to expose adhesive and close box securely. There is a prepaid UPS shipping label on this box. Drop in a UPS drop box, or at a UPS facility like [employer]. You may also contact [employer] to arrange UPS to pick up monitor package  at your home.  Follow-Up: At [facility], you and your health needs are our priority.  As part of our continuing mission to provide you with exceptional heart care, we have created designated Provider Care Teams.  These Care Teams include your primary Cardiologist (physician) and Advanced Practice Providers (APPs -  Physician Assistants and Nurse Practitioners) who all work together to provide you with the care you need, when you need it.  Your next appointment:   07/19/23 at 10AM  Provider:   Parke Poisson, MD

## 2023-05-07 NOTE — Progress Notes (Signed)
Cardiology Office Note:    Date:  05/07/2023   ID:  [name], DOB [birth year], MRN [id number]  PCP:  Frederica Kuster, MD  Cardiologist:  Parke Poisson, MD  Electrophysiologist:  None   Referring MD: Frederica Kuster, MD   Chief Complaint/Reason for Referral: Palpitations and tachycardia  History of Present Illness:    Jacqueline Hunt is a 89 y.o. female with a history of lung malignancy as well as liver malignancy, hypertension, who presents for evaluation of palpitations and tachycardia at the request of her primary care physician as well as continued management of hypertension.  Patient notes for me that over the last few months she has had increase in blood pressure readings, with home blood pressure readings approaching 140s and 150s occasionally in the 160s, rare low blood pressure, and none recorded since a few presyncopal episodes many years ago.  Her Apple Watch was telling her that her heart rate was consistently 100-110 beats a minute.  She also began to experience palpitations.  Her primary care doctor had told her in the past that she had arrhythmia.  She also feels her Apple Watch has told her once that she had atrial fibrillation.  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  About 15 years ago she had an episode of prolonged standing while shopping with a friend and slid down the counter and was noted to be hypotensive.  We discussed that these likely represent vagal presyncope.  She has been on lisinopril for blood pressure control for many years and her blood pressure has been overall well-controlled till recently.  Her primary care physician placed her on carvedilol 12.5 mg twice daily, which represents an up titration from the initial therapy.  She overall has felt okay and heart rates have  been more consistently in the 80s since.  She does however on EKG today demonstrates sinus rhythm with second-degree AV block Mobitz 1.  We discussed that further up titration of therapy may warrant investigation into her underlying rhythm to ensure that there are no high-grade AV block episodes to be concerned about.  She has not had recent presyncope or syncope.  We reviewed her PET images from February 2024 for the incidental cardiac findings.  She does have severe aortic atherosclerosis as well as severe three-vessel coronary artery calcifications, and likely at least mild aortic valve calcifications though the study was not gated so difficult to exactly triangulate.  She continues on rosuvastatin 5 mg daily and aspirin 81 mg daily for the history of aortic atherosclerosis.  She has no chest pain and no change in known chronic shortness of breath at this time.  She notes her shortness of breath has been present since her right upper lobectomy.   Past Medical History:  Diagnosis Date   Arthritis    Brain tumor (HCC)    Constipation due to opioid therapy    hospitalized d/t constipation   Dyspnea    High blood pressure    Lung cancer (HCC)    Osteopenia    Osteoporosis    Parathyroid adenoma     Past Surgical History:  Procedure Laterality Date   brain meningioma excesion     CATARACT EXTRACTION     CESAREAN SECTION     GALLBLADDER SURGERY  gallbladder surgery      LOBECTOMY     TONSILLECTOMY     TONSILLECTOMY     WRIST FRACTURE SURGERY      Current Medications: Current Meds  Medication Sig   acetaminophen (TYLENOL) 650 MG CR tablet Take 650 mg by mouth in the morning and at bedtime.   aspirin EC 81 MG tablet Take 81 mg by mouth daily.   carvedilol (COREG) 12.5 MG tablet Take 1 tablet (12.5 mg total) by mouth 2 (two) times daily with a meal.   Cholecalciferol (VITAMIN D3 PO) Take 500 mg by mouth daily.   Doxylamine Succinate, Sleep, (UNISOM PO) Take by mouth.   estradiol  (CLIMARA - DOSED IN MG/24 HR) 0.05 mg/24hr patch Place 1 patch (0.05 mg total) onto the skin once a week.   latanoprost (XALATAN) 0.005 % ophthalmic solution Place 1 drop into both eyes at bedtime.   lisinopril (ZESTRIL) 10 MG tablet TAKE 1 TABLET BY MOUTH TWICE A DAY   Multiple Vitamins-Minerals (DAILY MULTIVITAMIN PO) Take 1 tablet by mouth daily.   rosuvastatin (CRESTOR) 5 MG tablet Take 1 tablet (5 mg total) by mouth every evening.   vitamin B-12 (CYANOCOBALAMIN) 500 MCG tablet Take 500 mcg by mouth daily.     Allergies:   Patient has no known allergies.   Social History   Tobacco Use   Smoking status: Former    Packs/day: 1.00    Years: 57.10    Additional pack years: 0.00    Total pack years: 57.10    Types: Cigarettes    Quit date: 1964    Years since quitting: 60.4   Smokeless tobacco: Never  Vaping Use   Vaping Use: Never used  Substance Use Topics   Alcohol use: Never   Drug use: Never     Family History: The patient's family history is not on file.  ROS:   Please see the history of present illness.    All other systems reviewed and are negative.  EKGs/Labs/Other Studies Reviewed:    The following studies were reviewed today:  EKG:  NSR, mobitz I, poor R wave progression left anterior fascicular block.  Imaging studies that I have independently reviewed today: PET CT 01/22/23 - severe 3 vessel coronary artery calcifications.   Recent Labs: 11/28/2022: ALT 14; BUN 16; Creatinine 0.70; Hemoglobin 13.4; Platelet Count 133; Potassium 4.0; Sodium 139  Recent Lipid Panel    Component Value Date/Time   CHOL 140 09/13/2021 1103   TRIG 68 09/13/2021 1103   HDL 69 09/13/2021 1103   CHOLHDL 2.0 09/13/2021 1103   LDLCALC 57 09/13/2021 1103    Physical Exam:    VS:  BP (!) 152/88 (BP Location: Left Arm, Patient Position: Sitting, Cuff Size: Normal)   Pulse 68   Ht 5' (1.524 m)   Wt 117 lb 6.4 oz (53.3 kg)   SpO2 98%   BMI 22.93 kg/m     Wt Readings from  Last 5 Encounters:  05/07/23 117 lb 6.4 oz (53.3 kg)  04/11/23 117 lb (53.1 kg)  03/26/23 117 lb (53.1 kg)  03/14/23 119 lb 12.8 oz (54.3 kg)  01/30/23 117 lb 3 oz (53.2 kg)    Constitutional: No acute distress Eyes: sclera non-icteric, normal conjunctiva and lids ENMT: normal dentition, moist mucous membranes Cardiovascular: regular rhythm, normal rate, no murmur. S1 and S2 normal. No jugular venous distention.  Respiratory: clear to auscultation bilaterally GI : normal bowel sounds, soft and nontender. No distention.  MSK: extremities warm, well perfused. No edema.  NEURO: grossly nonfocal exam, moves all extremities. PSYCH: alert and oriented x 3, normal mood and affect.   ASSESSMENT:    1. Palpitations   2. Primary hypertension   3. Aortic atherosclerosis (HCC)   4. Coronary artery calcification    PLAN:    Palpitations - Plan: ECHOCARDIOGRAM COMPLETE, CARDIAC EVENT MONITOR  Primary hypertension  Aortic atherosclerosis (HCC)  Coronary artery calcification  Patient's primary concern today is palpitations and tachycardia.  Given her medical history this warrants evaluation for atrial fibrillation.  In addition she has had up titration of beta-blockade with evidence of development of Mobitz 1 AV block.  I believe she warrants a cardiac monitor to screen for A-fib and to monitor for high-grade AV block.  If no high-grade AV block found she can likely remain on her dose of carvedilol if it is treating her blood pressure well.  She may benefit from slightly permissive elevated blood pressure given that she also has some orthostasis with quick position change.  Blood pressure goal of less than 140/90 is likely the appropriate strategy given age and overall medical history.  We will review results from an echocardiogram that we will obtain now to assess palpitations in more detail, and make further medication recommendations to follow.  Will order an event monitor for 30 days to screen  arrhythmia.  Patient has no current chest pain and has stable known possibly even improving shortness of breath with activity.  Though she has dense three-vessel coronary artery calcifications on her recent PET/CT, we will await more concerning symptoms prior to an ischemic evaluation.  Agree with continuing aspirin and statin.  Okay to continue lisinopril given current blood pressure, could even consider uptitrating this dose for slightly better blood pressure control.  Total time of encounter: 45 minutes total time of encounter, including 30 minutes spent in face-to-face patient care on the date of this encounter. This time includes coordination of care and counseling regarding above mentioned problem list. Remainder of non-face-to-face time involved reviewing chart documents/testing relevant to the patient encounter and documentation in the medical record. I have independently reviewed documentation from referring provider.   Weston Brass, MD, [facility] [place]  [facility]   Shared Decision Making/Informed Consent:       Medication Adjustments/Labs and Tests Ordered: Current medicines are reviewed at length with the patient today.  Concerns regarding medicines are outlined above.   Orders Placed This Encounter  Procedures   CARDIAC EVENT MONITOR   ECHOCARDIOGRAM COMPLETE    No orders of the defined types were placed in this encounter.   Patient Instructions  Medication Instructions:  No Changes In Medications at this time.   *If you need a refill on your cardiac medications before your next appointment, please call your pharmacy*  Lab Work: None Ordered At This Time.   If you have labs (blood work) drawn today and your tests are completely normal, you will receive your results only by: MyChart Message (if you have MyChart) OR A paper copy in the mail If you have any lab test that is abnormal or we need to change your treatment, we will call you to review the  results.  Testing/Procedures: Your physician has requested that you have an echocardiogram. Echocardiography is a painless test that uses sound waves to create images of your heart. It provides your doctor with information about the size and shape of your heart and how well your heart's chambers and valves  are working. You may receive an ultrasound enhancing agent through an IV if needed to better visualize your heart during the echo.This procedure takes approximately one hour. There are no restrictions for this procedure. This will take place at the [address].    [employer] Cardiac Event Monitor Instructions  Your physician has requested you wear your cardiac event monitor for __30___ days, (1-30). [employer] may call or text to confirm a shipping address. The monitor will be sent to a land address via UPS. [employer] will not ship a monitor to a PO BOX. It typically takes 3-5 days to receive your monitor after it has been enrolled. [employer] will assist with USPS tracking if your package is delayed. The telephone number for [employer] is [phone]. Once you have received your monitor, please review the enclosed instructions. Instruction tutorials can also be viewed under help and settings on the enclosed cell phone. Your monitor has already been registered assigning a specific monitor serial # to you.  Billing and Self Pay Discount Information  [employer] has been provided the insurance information we had on file for you.  If your insurance has been updated, please call [employer] at [phone] to provide them with your updated insurance information.   [employer] offers a discounted Self Pay option for patients who have insurance that does not cover their cardiac event monitor or patients without insurance.  The discounted cost of a Self Pay Cardiac Event Monitor would be $225.00 , if the patient contacts [employer] at [phone] within 7 days of applying the monitor  to make payment arrangements.  If the patient does not contact [employer] within 7 days of applying the monitor, the cost of the cardiac event monitor will be $350.00.  Applying the monitor  Remove cell phone from case and turn it on. The cell phone works as IT consultant and needs to be within [employer] of you at all times. The cell phone will need to be charged on a daily basis. We recommend you plug the cell phone into the enclosed charger at your bedside table every night.  Monitor batteries: You will receive two monitor batteries labelled #1 and #2. These are your recorders. Plug battery #2 onto the second connection on the enclosed charger. Keep one battery on the charger at all times. This will keep the monitor battery deactivated. It will also keep it fully charged for when you need to switch your monitor batteries. A small light will be blinking on the battery emblem when it is charging. The light on the battery emblem will remain on when the battery is fully charged.  Open package of a Monitor strip. Insert battery #1 into black hood on strip and gently squeeze monitor battery onto connection as indicated in instruction booklet. Set aside while preparing skin.  Choose location for your strip, vertical or horizontal, as indicated in the instruction booklet. Shave to remove all hair from location. There cannot be any lotions, oils, powders, or colognes on skin where monitor is to be applied. Wipe skin clean with enclosed Saline wipe. Dry skin completely.  Peel paper labeled #1 off the back of the Monitor strip exposing the adhesive. Place the monitor on the chest in the vertical or horizontal position shown in the instruction booklet. One arrow on the monitor strip must be pointing upward. Carefully remove paper labeled #2, attaching remainder of strip to your skin. Try not to create any folds or wrinkles in the strip as you apply it.  Firmly press and release the circle in the  center of the monitor battery. You will hear a small beep. This is turning the monitor battery on. The heart emblem on the monitor battery will light up every 5 seconds if the monitor battery in turned on and connected to the patient securely. Do not push and hold the circle down as this turns the monitor battery off. The cell phone will locate the monitor battery. A screen will appear on the cell phone checking the connection of your monitor strip. This may read poor connection initially but change to good connection within the next minute. Once your monitor accepts the connection you will hear a series of 3 beeps followed by a climbing crescendo of beeps. A screen will appear on the cell phone showing the two monitor strip placement options. Touch the picture that demonstrates where you applied the monitor strip.  Your monitor strip and battery are waterproof. You are able to shower, bathe, or swim with the monitor on. They just ask you do not submerge deeper than 3 feet underwater. We recommend removing the monitor if you are swimming in a lake, river, or ocean.  Your monitor battery will need to be switched to a fully charged monitor battery approximately once a week. The cell phone will alert you of an action which needs to be made.  On the cell phone, tap for details to reveal connection status, monitor battery status, and cell phone battery status. The green dots indicates your monitor is in good status. A red dot indicates there is something that needs your attention.  To record a symptom, click the circle on the monitor battery. In 30-60 seconds a list of symptoms will appear on the cell phone. Select your symptom and tap save. Your monitor will record a sustained or significant arrhythmia regardless of you clicking the button. Some patients do not feel the heart rhythm irregularities. [employer] will notify us of any serious or critical events.  Refer to instruction booklet for  instructions on switching batteries, changing strips, the Do not disturb or Pause features, or any additional questions.  Call [employer] at [phone], to confirm your monitor is transmitting and record your baseline. They will answer any questions you may have regarding the monitor instructions at that time.  Returning the monitor to [employer]  Place all equipment back into blue box. Peel off strip of paper to expose adhesive and close box securely. There is a prepaid UPS shipping label on this box. Drop in a UPS drop box, or at a UPS facility like [employer]. You may also contact [employer] to arrange UPS to pick up monitor package at your home.  Follow-Up: At [facility], you and your health needs are our priority.  As part of our continuing mission to provide you with exceptional heart care, we have created designated Provider Care Teams.  These Care Teams include your primary Cardiologist (physician) and Advanced Practice Providers (APPs -  Physician Assistants and Nurse Practitioners) who all work together to provide you with the care you need, when you need it.  Your next appointment:   07/19/23 at 10AM  Provider:   Parke Poisson, MD

## 2023-05-12 DIAGNOSIS — R002 Palpitations: Secondary | ICD-10-CM

## 2023-05-17 NOTE — Addendum Note (Signed)
Addended by: Corey Harold T on: 05/17/2023 11:01 AM   Modules accepted: Orders

## 2023-05-18 ENCOUNTER — Other Ambulatory Visit: Payer: Self-pay | Admitting: Family Medicine

## 2023-05-24 DIAGNOSIS — H34232 Retinal artery branch occlusion, left eye: Secondary | ICD-10-CM | POA: Diagnosis not present

## 2023-05-24 DIAGNOSIS — H43812 Vitreous degeneration, left eye: Secondary | ICD-10-CM | POA: Diagnosis not present

## 2023-05-24 DIAGNOSIS — H401131 Primary open-angle glaucoma, bilateral, mild stage: Secondary | ICD-10-CM | POA: Diagnosis not present

## 2023-05-24 DIAGNOSIS — H35352 Cystoid macular degeneration, left eye: Secondary | ICD-10-CM | POA: Diagnosis not present

## 2023-05-24 DIAGNOSIS — H3562 Retinal hemorrhage, left eye: Secondary | ICD-10-CM | POA: Diagnosis not present

## 2023-05-24 DIAGNOSIS — H34812 Central retinal vein occlusion, left eye, with macular edema: Secondary | ICD-10-CM | POA: Diagnosis not present

## 2023-05-25 ENCOUNTER — Ambulatory Visit (HOSPITAL_COMMUNITY): Payer: Medicare HMO | Attending: Internal Medicine

## 2023-05-25 DIAGNOSIS — R9431 Abnormal electrocardiogram [ECG] [EKG]: Secondary | ICD-10-CM | POA: Diagnosis not present

## 2023-05-25 DIAGNOSIS — I2510 Atherosclerotic heart disease of native coronary artery without angina pectoris: Secondary | ICD-10-CM | POA: Insufficient documentation

## 2023-05-25 DIAGNOSIS — I081 Rheumatic disorders of both mitral and tricuspid valves: Secondary | ICD-10-CM | POA: Diagnosis not present

## 2023-05-25 DIAGNOSIS — R002 Palpitations: Secondary | ICD-10-CM | POA: Insufficient documentation

## 2023-05-25 DIAGNOSIS — R000 Tachycardia, unspecified: Secondary | ICD-10-CM | POA: Insufficient documentation

## 2023-05-25 DIAGNOSIS — Z87891 Personal history of nicotine dependence: Secondary | ICD-10-CM | POA: Diagnosis not present

## 2023-05-25 DIAGNOSIS — I340 Nonrheumatic mitral (valve) insufficiency: Secondary | ICD-10-CM | POA: Diagnosis not present

## 2023-05-25 DIAGNOSIS — Z85118 Personal history of other malignant neoplasm of bronchus and lung: Secondary | ICD-10-CM | POA: Diagnosis not present

## 2023-05-25 DIAGNOSIS — I700 Atherosclerosis of aorta: Secondary | ICD-10-CM | POA: Diagnosis not present

## 2023-05-25 DIAGNOSIS — I361 Nonrheumatic tricuspid (valve) insufficiency: Secondary | ICD-10-CM | POA: Diagnosis not present

## 2023-05-25 DIAGNOSIS — Z8505 Personal history of malignant neoplasm of liver: Secondary | ICD-10-CM | POA: Insufficient documentation

## 2023-05-25 LAB — ECHOCARDIOGRAM COMPLETE: S' Lateral: 2.8 cm

## 2023-05-28 ENCOUNTER — Other Ambulatory Visit: Payer: Medicare HMO

## 2023-05-30 ENCOUNTER — Ambulatory Visit: Payer: Medicare HMO | Admitting: Internal Medicine

## 2023-06-09 ENCOUNTER — Other Ambulatory Visit: Payer: Self-pay | Admitting: Family Medicine

## 2023-06-09 DIAGNOSIS — I10 Essential (primary) hypertension: Secondary | ICD-10-CM

## 2023-06-11 ENCOUNTER — Telehealth: Payer: Self-pay

## 2023-06-11 NOTE — Telephone Encounter (Signed)
   Cardiac Monitor Alert  Date of alert:  06/11/2023   Patient Name: Jacqueline Hunt  DOB: [birth year]  MRN: [id number]   [facility] Cardiologist: Parke Poisson, MD  [facility] EP:  None    Monitor Information: Cardiac Event Monitor [[employer]]  Reason:  Palpitations  Ordering provider:  Dr. Jacques Navy    Alert 2nd degree AV Block, Type I With 9 beat run of V-tach This is the 1st alert for this rhythm.   Next Cardiology Appointment   Date:  07/19/23 @ 10am  Provider:  Dr. Jacques Navy   The patient was contacted today.  She is asymptomatic. Arrhythmia, symptoms and history reviewed with Dr. Rubie Maid.   Plan:  Continue to monitor, pt is sending [employer] back in the next day or 2, we will anticipate those results.    Bernita Buffy, RN  06/11/2023 3:42 PM

## 2023-06-13 ENCOUNTER — Encounter: Payer: Self-pay | Admitting: Family Medicine

## 2023-06-13 ENCOUNTER — Ambulatory Visit (INDEPENDENT_AMBULATORY_CARE_PROVIDER_SITE_OTHER): Payer: Medicare HMO | Admitting: Family Medicine

## 2023-06-13 VITALS — BP 140/88 | HR 67 | Temp 97.1°F | Resp 18 | Ht 60.0 in | Wt 113.2 lb

## 2023-06-13 DIAGNOSIS — E782 Mixed hyperlipidemia: Secondary | ICD-10-CM

## 2023-06-13 DIAGNOSIS — H6120 Impacted cerumen, unspecified ear: Secondary | ICD-10-CM | POA: Diagnosis not present

## 2023-06-13 DIAGNOSIS — H34812 Central retinal vein occlusion, left eye, with macular edema: Secondary | ICD-10-CM | POA: Diagnosis not present

## 2023-06-13 DIAGNOSIS — I700 Atherosclerosis of aorta: Secondary | ICD-10-CM

## 2023-06-13 DIAGNOSIS — C3491 Malignant neoplasm of unspecified part of right bronchus or lung: Secondary | ICD-10-CM

## 2023-06-13 NOTE — Progress Notes (Signed)
Provider:  Jacalyn Lefevre, MD  Careteam: Patient Care Team: Frederica Kuster, MD as PCP - General (Family Medicine) Parke Poisson, MD as PCP - Cardiology (Cardiology) Edilia Bo (Podiatry)  PLACE OF SERVICE:  [facility]  Advanced Directive information Does Patient Have a Medical Advance Directive?: No, Would patient like information on creating a medical advance directive?: No - Patient declined  No Known Allergies  Chief Complaint  Patient presents with   Medical Management of Chronic Issues    Patient is here for a 68M follow up for  HTN. HLD, and other chronic conditions      HPI: Patient is a 89 y.o. female patient seen recently by cardiology.  Wore monitor to rule out A-fib.  Just completed the study the last few days and results are not available. She continues to have some left-sided neck pain.  She takes arthritis strength Tylenol 2 in AM and 2 in p.m. that helps but she still has some pain.  Suggested application of topical Biofreeze to see if that might help and if not after several weeks may add 1 Aleve to current regimen. Also seeing retinologist for lefteye problems, central vein retinal occlusion. She continues to have cancer involving the lung and liver but she seems to be doing well from a symptomatic view. In reviewing her labs she continues to take rosuvastatin for lipids, which have not been assessed in almost 2 years  Review of Systems:  Review of Systems  Constitutional: Negative.   Respiratory: Negative.    Cardiovascular:  Positive for palpitations.  Genitourinary: Negative.   Musculoskeletal:  Positive for neck pain.  Skin: Negative.   Neurological: Negative.   Psychiatric/Behavioral: Negative.      Past Medical History:  Diagnosis Date   Arthritis    Brain tumor (HCC)    Constipation due to opioid therapy    hospitalized d/t constipation   Dyspnea    High blood pressure    Lung cancer (HCC)    Osteopenia    Osteoporosis     Parathyroid adenoma    Past Surgical History:  Procedure Laterality Date   brain meningioma excesion     CATARACT EXTRACTION     CESAREAN SECTION     GALLBLADDER SURGERY     gallbladder surgery      LOBECTOMY     TONSILLECTOMY     TONSILLECTOMY     WRIST FRACTURE SURGERY     Social History:   reports that she quit smoking about 60 years ago. Her smoking use included cigarettes. She has never used smokeless tobacco. She reports that she does not drink alcohol and does not use drugs.  History reviewed. No pertinent family history.  Medications: Patient's Medications  New Prescriptions   No medications on file  Previous Medications   ACETAMINOPHEN (TYLENOL) 650 MG CR TABLET    Take 650 mg by mouth in the morning and at bedtime.   ASPIRIN EC 81 MG TABLET    Take 81 mg by mouth daily.   CARVEDILOL (COREG) 12.5 MG TABLET    Take 1 tablet (12.5 mg total) by mouth 2 (two) times daily with a meal.   CHOLECALCIFEROL (VITAMIN D3 PO)    Take 500 mg by mouth daily.   DOXYLAMINE SUCCINATE, SLEEP, (UNISOM PO)    Take by mouth.   ESTRADIOL (CLIMARA - DOSED IN MG/24 HR) 0.05 MG/24HR PATCH    Place 1 patch (0.05 mg total) onto the skin once a week.  LATANOPROST (XALATAN) 0.005 % OPHTHALMIC SOLUTION    Place 1 drop into both eyes at bedtime.   LISINOPRIL (ZESTRIL) 10 MG TABLET    TAKE 1 TABLET BY MOUTH TWICE A DAY   MULTIPLE VITAMINS-MINERALS (DAILY MULTIVITAMIN PO)    Take 1 tablet by mouth daily.   ROSUVASTATIN (CRESTOR) 5 MG TABLET    TAKE 1 TABLET BY MOUTH EVERY DAY IN THE EVENING   VITAMIN B-12 (CYANOCOBALAMIN) 500 MCG TABLET    Take 500 mcg by mouth daily.  Modified Medications   No medications on file  Discontinued Medications   No medications on file    Physical Exam:  Vitals:   06/13/23 0908 06/13/23 1052  BP: (!) 140/80 (!) 140/88  Pulse:  67  Resp:  18  Temp:  (!) 97.1 F (36.2 C)  SpO2:  96%  Weight:  113 lb 3.2 oz (51.3 kg)  Height: 5' (1.524 m) 5' (1.524 m)   Body  mass index is 22.11 kg/m. Wt Readings from Last 3 Encounters:  06/13/23 113 lb 3.2 oz (51.3 kg)  05/07/23 117 lb 6.4 oz (53.3 kg)  04/11/23 117 lb (53.1 kg)    Physical Exam Vitals and nursing note reviewed.  Constitutional:      Appearance: Normal appearance.  HENT:     Right Ear: Ear canal normal.     Ears:     Comments: Right EAC full of cerumen.  Will be irrigated Cardiovascular:     Rate and Rhythm: Normal rate and regular rhythm.  Pulmonary:     Effort: Pulmonary effort is normal.     Breath sounds: Normal breath sounds.  Neurological:     Mental Status: She is alert.     Labs reviewed: Basic Metabolic Panel: Recent Labs    11/28/22 1002  NA 139  K 4.0  CL 106  CO2 28  GLUCOSE 78  BUN 16  CREATININE 0.70  CALCIUM 9.4   Liver Function Tests: Recent Labs    11/28/22 1002  AST 20  ALT 14  ALKPHOS 53  BILITOT 0.5  PROT 6.8  ALBUMIN 4.1   No results for input(s): "LIPASE", "AMYLASE" in the last 8760 hours. No results for input(s): "AMMONIA" in the last 8760 hours. CBC: Recent Labs    11/28/22 1002  WBC 3.9*  NEUTROABS 2.6  HGB 13.4  HCT 40.0  MCV 90.7  PLT 133*   Lipid Panel: No results for input(s): "CHOL", "HDL", "LDLCALC", "TRIG", "CHOLHDL", "LDLDIRECT" in the last 8760 hours. TSH: No results for input(s): "TSH" in the last 8760 hours. A1C: No results found for: "HGBA1C"   Assessment/Plan  1. Moderate mixed hyperlipidemia not requiring statin therapy Plan to check lipids today.  LFTs have been done by oncology and those are within normal limits  2. Adenocarcinoma of right lung, stage 1 (HCC) Patient has primary cancer of the lung but also metastatic to liver and brain but seems to be asymptomatic currently and is followed by oncology  3. Aortic atherosclerosis (HCC) Continues on statin and blood pressure pill  4. Central retinal vein occlusion with macular edema of left eye Followed by retinologist and is receiving injections and  laser treatments for this problem   Jacalyn Lefevre, MD [facility] [phone]

## 2023-06-14 ENCOUNTER — Encounter: Payer: Self-pay | Admitting: Internal Medicine

## 2023-06-14 LAB — LIPID PANEL
Cholesterol: 142 mg/dL (ref <200)
HDL: 70 mg/dL (ref >=50)
LDL Cholesterol (Calc): 55 mg/dL (calc)
Non-HDL Cholesterol (Calc): 72 mg/dL (calc) (ref <130)
Total CHOL/HDL Ratio: 2 (calc) (ref <5.0)
Triglycerides: 89 mg/dL (ref <150)

## 2023-06-19 ENCOUNTER — Encounter: Payer: Self-pay | Admitting: Internal Medicine

## 2023-06-20 ENCOUNTER — Ambulatory Visit: Payer: Medicare HMO | Attending: Internal Medicine

## 2023-06-20 ENCOUNTER — Other Ambulatory Visit: Payer: Self-pay | Admitting: Internal Medicine

## 2023-06-20 DIAGNOSIS — R002 Palpitations: Secondary | ICD-10-CM

## 2023-06-21 ENCOUNTER — Telehealth: Payer: Self-pay | Admitting: Physician Assistant

## 2023-06-21 ENCOUNTER — Telehealth: Payer: Self-pay

## 2023-06-21 NOTE — Telephone Encounter (Signed)
TC from patient due to c/o feeling nauseated and "clammy" today, denies vomiting and diarrhea. She also reports urinary frequency and feeling "pressure on urethra." She denies burning w/ urination, states no odor or abnormal color noted. She is inquiring about whether these symptoms are related to her disease progression. She is also wanting to notify us of new onset of R hip pain which radiates down her leg. She is taking Tylenol Arthritis for chronic neck pain and has not taken any additional medication for the hip pain. Pt aware of appointment w/ provider on 06/25/23. Will ask that provider advise.

## 2023-06-21 NOTE — Telephone Encounter (Signed)
Per Dr. Asa Lente response: I do not think her urinary symptoms are related. She may UA by her PCP or urgent care to check for UTI   TC to patient to notify of Dr. Asa Lente recommendation. Reminded of upcoming appt w/ PA. Instructed to seek urgent medical attention for worsening symptoms. Pt verbalizes understanding.

## 2023-06-22 ENCOUNTER — Ambulatory Visit (INDEPENDENT_AMBULATORY_CARE_PROVIDER_SITE_OTHER): Payer: Medicare HMO

## 2023-06-22 ENCOUNTER — Ambulatory Visit (HOSPITAL_COMMUNITY)
Admission: EM | Admit: 2023-06-22 | Discharge: 2023-06-22 | Disposition: A | Discharge status: Home / Self Care | Payer: Medicare HMO | Source: Home / Self Care

## 2023-06-22 ENCOUNTER — Encounter: Payer: Self-pay | Admitting: Internal Medicine

## 2023-06-22 ENCOUNTER — Encounter (HOSPITAL_COMMUNITY): Payer: Self-pay

## 2023-06-22 ENCOUNTER — Ambulatory Visit: Payer: Medicare HMO | Admitting: Internal Medicine

## 2023-06-22 VITALS — BP 120/70 | HR 74 | Ht 60.0 in | Wt 111.0 lb

## 2023-06-22 DIAGNOSIS — I2510 Atherosclerotic heart disease of native coronary artery without angina pectoris: Secondary | ICD-10-CM | POA: Diagnosis not present

## 2023-06-22 DIAGNOSIS — M25551 Pain in right hip: Secondary | ICD-10-CM

## 2023-06-22 DIAGNOSIS — I441 Atrioventricular block, second degree: Secondary | ICD-10-CM | POA: Diagnosis not present

## 2023-06-22 DIAGNOSIS — I700 Atherosclerosis of aorta: Secondary | ICD-10-CM

## 2023-06-22 DIAGNOSIS — R002 Palpitations: Secondary | ICD-10-CM | POA: Diagnosis not present

## 2023-06-22 DIAGNOSIS — I10 Essential (primary) hypertension: Secondary | ICD-10-CM | POA: Diagnosis not present

## 2023-06-22 DIAGNOSIS — R350 Frequency of micturition: Secondary | ICD-10-CM | POA: Diagnosis not present

## 2023-06-22 DIAGNOSIS — I2584 Coronary atherosclerosis due to calcified coronary lesion: Secondary | ICD-10-CM

## 2023-06-22 LAB — POCT URINALYSIS DIP (MANUAL ENTRY)
Bilirubin, UA: NEGATIVE
Blood, UA: NEGATIVE
Glucose, UA: NEGATIVE mg/dL
Ketones, POC UA: NEGATIVE mg/dL
Leukocytes, UA: NEGATIVE
Nitrite, UA: NEGATIVE
Protein Ur, POC: NEGATIVE mg/dL
Spec Grav, UA: 1.025 (ref 1.010–1.025)
Urobilinogen, UA: 0.2 E.U./dL
pH, UA: 6 (ref 5.0–8.0)

## 2023-06-22 MED ORDER — AMLODIPINE BESYLATE 5 MG PO TABS: 5.0000 mg | ORAL_TABLET | Freq: Every day | ORAL | 3 refills | Status: DC

## 2023-06-22 NOTE — Discharge Instructions (Signed)
I will call you if there is anything abnormal on your xray. If you don't hear from me in the next 2-3 hours, everything looks good!  Please keep appointment with your oncologist for Monday.  Continue tylenol and aleve as needed for the pain.  Please go to the emergency department if symptoms worsen, especially if they become severe.

## 2023-06-22 NOTE — ED Triage Notes (Signed)
Onset of urinary symptoms for 4 days. Patient having urinary frequency worse at night. No pain with urination. Having clamminess and nausea in the mornings.   Pain in the right hips and leg going down into the knee. Patient using a can to walk. No known falls or injuries.

## 2023-06-22 NOTE — Patient Instructions (Addendum)
Medication Instructions:  Your physician has recommended you make the following change in your medication:   -Stop taking carvedilol.  -Start amlodipine (norvasc) 5mg  once daily.  *If you need a refill on your cardiac medications before your next appointment, please call your pharmacy*    Follow-Up: At [facility], you and your health needs are our priority.  As part of our continuing mission to provide you with exceptional heart care, we have created designated Provider Care Teams.  These Care Teams include your primary Cardiologist (physician) and Advanced Practice Providers (APPs -  Physician Assistants and Nurse Practitioners) who all work together to provide you with the care you need, when you need it.  We recommend signing up for the patient portal called "MyChart".  Sign up information is provided on this After Visit Summary.  MyChart is used to connect with patients for Virtual Visits (Telemedicine).  Patients are able to view lab/test results, encounter notes, upcoming appointments, etc.  Non-urgent messages can be sent to your provider as well.   To learn more about what you can do with MyChart, go to [email or web address].    Your next appointment:   Thursday, August 22nd @ 10am  Provider:   Parke Poisson, MD

## 2023-06-22 NOTE — ED Provider Notes (Signed)
[facility]    CSN: [id number] Arrival date & time: 06/22/23  1122      History   Chief Complaint Chief Complaint  Patient presents with   Urinary Tract Infection   Hip Pain    HPI Jacqueline Hunt is a 89 y.o. female.  4 day history of urinary frequency and bladder pressure No dysuria. Reports mild nausea in the mornings. No vomiting or diarrhea. No fevers. Normal BMs  Also having R hip pain that radiates into upper thigh. Denies fall, injury, or trauma. Pain resolves if she lays down and elevates the legs. Takes tylenol arthritis. Her PCP recommended aleve prn which helps temporarily. No paresthesias. Osteoporosis history.  Sees oncology for liver and lung cancer Next appointment in 3 days. She notified her provider about symptoms and they recommended having a UA done at [facility]  Past Medical History:  Diagnosis Date   Arthritis    Brain tumor (HCC)    Constipation due to opioid therapy    hospitalized d/t constipation   Dyspnea    High blood pressure    Lung cancer (HCC)    Osteopenia    Osteoporosis    Parathyroid adenoma     Patient Active Problem List   Diagnosis Date Noted   Neck pain 12/20/2022   Central retinal vein occlusion with macular edema of left eye 08/14/2022   Neuroendocrine carcinoma metastatic to liver (HCC) 11/22/2021   Cough 05/31/2021   Wrist pain, acute 04/19/2021   Malignant neoplasm of upper lobe of right lung (HCC) 02/01/2021   Herpes simplex 02/01/2021   Benign neoplasm of colon 02/01/2021   Malignant neoplasm of brain (HCC) 02/01/2021   Parathyroid adenoma 02/01/2021   Adenocarcinoma of right lung, stage 1 (HCC) 09/21/2020   History of lung cancer 08/30/2020   Aortic atherosclerosis (HCC) 08/30/2020   Senile osteoporosis 08/30/2020   History of wrist fracture 08/30/2020   High blood pressure 08/30/2020   History of meningioma 08/30/2020   Primary osteoarthritis involving multiple joints 08/30/2020   Primary open angle  glaucoma (POAG) of both eyes, mild stage 08/30/2020   Drug-induced constipation 07/14/2019   Hyperlipidemia 06/25/2019   Multiple thyroid nodules 06/25/2019   Lung nodule 06/10/2019   ASNHL (asymmetrical sensorineural hearing loss) 07/26/2018   Osteoarthritis 11/29/2017   Hot flashes due to surgical menopause 04/12/2017   Skin macule 03/16/2017   Benign nevus 09/22/2016   Seborrheic keratoses 09/22/2016   Skin cancer screening 09/22/2016   Primary hyperparathyroidism (HCC) 07/18/2016   Rheumatoid pannus of cervical spine (HCC) 11/10/2015   History of total hysterectomy with bilateral salpingo-oophorectomy (BSO) 11/26/2014   Mild cognitive disorder 11/26/2014   Osteopenia 11/26/2014   Restless leg 11/26/2014   Insomnia 12/24/2012   Obstructive sleep apnea (adult) (pediatric) 12/24/2012    Past Surgical History:  Procedure Laterality Date   brain meningioma excesion     CATARACT EXTRACTION     CESAREAN SECTION     GALLBLADDER SURGERY     gallbladder surgery      LOBECTOMY     TONSILLECTOMY     TONSILLECTOMY     WRIST FRACTURE SURGERY      OB History   No obstetric history on file.      Home Medications    Prior to Admission medications   Medication Sig Start Date End Date Taking? Authorizing Provider  acetaminophen (TYLENOL) 650 MG CR tablet Take 650 mg by mouth in the morning and at bedtime.   Yes [provider]  aspirin EC 81 MG tablet Take 81 mg by mouth daily.   Yes [provider]  carvedilol (COREG) 12.5 MG tablet Take 1 tablet (12.5 mg total) by mouth 2 (two) times daily with a meal. 03/26/23  Yes Sharee Holster, NP  Cholecalciferol (VITAMIN D3 PO) Take 500 mg by mouth daily.   Yes [provider]  Doxylamine Succinate, Sleep, (UNISOM PO) Take by mouth.   Yes [provider]  estradiol (CLIMARA - DOSED IN MG/24 HR) 0.05 mg/24hr patch Place 1 patch (0.05 mg total) onto the skin once a week. 11/10/22  Yes Ngetich, Dinah C, NP   latanoprost (XALATAN) 0.005 % ophthalmic solution Place 1 drop into both eyes at bedtime.   Yes [provider]  lisinopril (ZESTRIL) 10 MG tablet TAKE 1 TABLET BY MOUTH TWICE A DAY 04/17/23  Yes Frederica Kuster, MD  Multiple Vitamins-Minerals (DAILY MULTIVITAMIN PO) Take 1 tablet by mouth daily.   Yes [provider]  Naproxen Sodium (ALEVE) 220 MG CAPS  06/13/23  Yes [provider]  rosuvastatin (CRESTOR) 5 MG tablet TAKE 1 TABLET BY MOUTH EVERY DAY IN THE EVENING 05/18/23  Yes Frederica Kuster, MD  vitamin B-12 (CYANOCOBALAMIN) 500 MCG tablet Take 500 mcg by mouth daily.   Yes [provider]    Family History History reviewed. No pertinent family history.  Social History Social History   Tobacco Use   Smoking status: Former    Current packs/day: 0.00    Types: Cigarettes    Quit date: 1964    Years since quitting: 60.6   Smokeless tobacco: Never  Vaping Use   Vaping status: Never Used  Substance Use Topics   Alcohol use: Never   Drug use: Never     Allergies   Patient has no known allergies.   Review of Systems Review of Systems As per HPI  Physical Exam Triage Vital Signs ED Triage Vitals  Encounter Vitals Group     BP      Systolic BP Percentile      Diastolic BP Percentile      Pulse      Resp      Temp      Temp src      SpO2      Weight      Height      Head Circumference      Peak Flow      Pain Score      Pain Loc      Pain Education      Exclude from Growth Chart    No data found.  Updated Vital Signs BP (!) 139/90 (BP Location: Left Arm)   Pulse 66   Temp 97.7 F (36.5 C) (Oral)   Resp 18   Ht 5' (1.524 m)   Wt 113 lb 1.5 oz (51.3 kg)   SpO2 93%   BMI 22.09 kg/m    Physical Exam Vitals and nursing note reviewed.  Constitutional:      General: She is not in acute distress. HENT:     Mouth/Throat:     Mouth: Mucous membranes are moist.     Pharynx: Oropharynx is clear.  Eyes:      Conjunctiva/sclera: Conjunctivae normal.     Pupils: Pupils are equal, round, and reactive to light.  Cardiovascular:     Rate and Rhythm: Normal rate and regular rhythm.     Heart sounds: Normal heart sounds.  Pulmonary:  Effort: Pulmonary effort is normal.     Breath sounds: Normal breath sounds.  Abdominal:     General: Bowel sounds are normal.     Palpations: Abdomen is soft.     Tenderness: There is no abdominal tenderness. There is no right CVA tenderness, left CVA tenderness, guarding or rebound.  Musculoskeletal:        General: No tenderness, deformity or signs of injury. Normal range of motion.     Right hip: No bony tenderness. Normal range of motion.     Left hip: No bony tenderness. Normal range of motion.     Right lower leg: No edema.     Left lower leg: No edema.  Neurological:     Mental Status: She is alert and oriented to person, place, and time.     Gait: Gait normal.     Comments: Ambulates with cane, baseline     UC Treatments / Results  Labs (all labs ordered are listed, but only abnormal results are displayed) Labs Reviewed  POCT URINALYSIS DIP (MANUAL ENTRY) - Normal    EKG  Radiology DG Hip Unilat With Pelvis 2-3 Views Right  Result Date: 06/22/2023 CLINICAL DATA:  Right hip pain. EXAM: DG HIP (WITH OR WITHOUT PELVIS) 2-3V RIGHT COMPARISON:  None Available. FINDINGS: There is no evidence of hip fracture or dislocation. There is no evidence of arthropathy or other focal bone abnormality. IMPRESSION: Negative. Electronically Signed   By: Lupita Raider M.D.   On: 06/22/2023 14:08    Procedures Procedures   Medications Ordered in UC Medications - No data to display  Initial Impression / Assessment and Plan / UC Course  I have reviewed the triage vital signs and the nursing notes.  Pertinent labs & imaging results that were available during my care of the patient were reviewed by me and considered in my medical decision making (see chart for  details).  UA is unremarkable. Low concern for UTI at this time. Patient agrees; states does not seem like UTI to her. Will monitor symptoms and return if any discomfort or pain.   Patient reports she has a cardiology appointment in 45 minutes that she needs to get to. They called her requesting she be seen today due to changes on her Holter monitor. RRR in urgent care. I have advised she follow with her oncologist at her appointment in 3 days regarding her symptoms. Strict ED precautions for any worsening of symptoms. In the meantime continue tylenol and the recommended aleve for pain. Patient agreeable to plan, no questions at this time.   Right hip xray is negative. Images independently reviewed by me, agree with radiology interpretation.  Final Clinical Impressions(s) / UC Diagnoses   Final diagnoses:  Urinary frequency  Right hip pain     Discharge Instructions      I will call you if there is anything abnormal on your xray. If you don't hear from me in the next 2-3 hours, everything looks good!  Please keep appointment with your oncologist for Monday.  Continue tylenol and aleve as needed for the pain.  Please go to the emergency department if symptoms worsen, especially if they become severe.       ED Prescriptions   None    PDMP not reviewed this encounter.   Marlow Baars, [place] 06/22/23 1438

## 2023-06-22 NOTE — Progress Notes (Unsigned)
Cardiology Office Note:    Date:  06/22/2023   ID:  Jacqueline Hunt, DOB Feb 20, 1928, MRN [id number]  PCP:  Frederica Kuster, MD  Cardiologist:  Parke Poisson, MD  Electrophysiologist:  None   Referring MD: Frederica Kuster, MD   Chief Complaint/Reason for Referral: Palpitations and tachycardia  History of Present Illness:    Jacqueline Hunt is a 89 y.o. female with a history of lung malignancy as well as liver malignancy, hypertension, who presents for evaluation of palpitations and tachycardia at the request of her primary care physician as well as continued management of hypertension.  Patient notes for me that over the last few months she has had increase in blood pressure readings, with home blood pressure readings approaching 140s and 150s occasionally in the 160s, rare low blood pressure, and none recorded since a few presyncopal episodes many years ago.  Her Apple Watch was telling her that her heart rate was consistently 100-110 beats a minute.  She also began to experience palpitations.  Her primary care doctor had told her in the past that she had arrhythmia.  She also feels her Apple Watch has told her once that she had atrial fibrillation.  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  About 15 years ago she had an episode of prolonged standing while shopping with a friend and slid down the counter and was noted to be hypotensive.  We discussed that these likely represent vagal presyncope.  She has been on lisinopril for blood pressure control for many years and her blood pressure has been overall well-controlled till recently.  Her primary care physician placed her on carvedilol 12.5 mg twice daily, which represents an up titration from the initial therapy.  She overall has felt okay and heart rates have  been more consistently in the 80s since.  She does however on EKG today demonstrates sinus rhythm with second-degree AV block Mobitz 1.  We discussed that further up titration of therapy may warrant investigation into her underlying rhythm to ensure that there are no high-grade AV block episodes to be concerned about.  She has not had recent presyncope or syncope.  We reviewed her PET images from February 2024 for the incidental cardiac findings.  She does have severe aortic atherosclerosis as well as severe three-vessel coronary artery calcifications, and likely at least mild aortic valve calcifications though the study was not gated so difficult to exactly triangulate.  She continues on rosuvastatin 5 mg daily and aspirin 81 mg daily for the history of aortic atherosclerosis.  She has no chest pain and no change in known chronic shortness of breath at this time.  She notes her shortness of breath has been present since her right upper lobectomy.   Past Medical History:  Diagnosis Date   Arthritis    Brain tumor (HCC)    Constipation due to opioid therapy    hospitalized d/t constipation   Dyspnea    High blood pressure    Lung cancer (HCC)    Osteopenia    Osteoporosis    Parathyroid adenoma     Past Surgical History:  Procedure Laterality Date   brain meningioma excesion     CATARACT EXTRACTION     CESAREAN SECTION     GALLBLADDER SURGERY  gallbladder surgery      LOBECTOMY     TONSILLECTOMY     TONSILLECTOMY     WRIST FRACTURE SURGERY      Current Medications: Current Meds  Medication Sig   acetaminophen (TYLENOL) 650 MG CR tablet Take 650 mg by mouth in the morning and at bedtime.   aspirin EC 81 MG tablet Take 81 mg by mouth daily.   carvedilol (COREG) 12.5 MG tablet Take 1 tablet (12.5 mg total) by mouth 2 (two) times daily with a meal.   Cholecalciferol (VITAMIN D3 PO) Take 500 mg by mouth daily.   Doxylamine Succinate, Sleep, (UNISOM PO) Take by mouth.   estradiol  (CLIMARA - DOSED IN MG/24 HR) 0.05 mg/24hr patch Place 1 patch (0.05 mg total) onto the skin once a week.   latanoprost (XALATAN) 0.005 % ophthalmic solution Place 1 drop into both eyes at bedtime.   lisinopril (ZESTRIL) 10 MG tablet TAKE 1 TABLET BY MOUTH TWICE A DAY   Multiple Vitamins-Minerals (DAILY MULTIVITAMIN PO) Take 1 tablet by mouth daily.   Naproxen Sodium (ALEVE) 220 MG CAPS    rosuvastatin (CRESTOR) 5 MG tablet TAKE 1 TABLET BY MOUTH EVERY DAY IN THE EVENING   vitamin B-12 (CYANOCOBALAMIN) 500 MCG tablet Take 500 mcg by mouth daily.     Allergies:   Patient has no known allergies.   Social History   Tobacco Use   Smoking status: Former    Current packs/day: 0.00    Types: Cigarettes    Quit date: 1964    Years since quitting: 60.6   Smokeless tobacco: Never  Vaping Use   Vaping status: Never Used  Substance Use Topics   Alcohol use: Never   Drug use: Never     Family History: The patient's family history is not on file.  ROS:   Please see the history of present illness.    All other systems reviewed and are negative.  EKGs/Labs/Other Studies Reviewed:    The following studies were reviewed today:  EKG:  NSR, mobitz I, poor R wave progression left anterior fascicular block.  Imaging studies that I have independently reviewed today: PET CT 01/22/23 - severe 3 vessel coronary artery calcifications.   Recent Labs: 11/28/2022: ALT 14; BUN 16; Creatinine 0.70; Hemoglobin 13.4; Platelet Count 133; Potassium 4.0; Sodium 139  Recent Lipid Panel    Component Value Date/Time   CHOL 142 06/13/2023 1146   TRIG 89 06/13/2023 1146   HDL 70 06/13/2023 1146   CHOLHDL 2.0 06/13/2023 1146   LDLCALC 55 06/13/2023 1146    Physical Exam:    VS:  BP 120/70   Pulse 74   Ht 5' (1.524 m)   Wt 111 lb (50.3 kg)   SpO2 96%   BMI 21.68 kg/m     Wt Readings from Last 5 Encounters:  06/22/23 111 lb (50.3 kg)  06/22/23 113 lb 1.5 oz (51.3 kg)  06/13/23 113 lb 3.2 oz (51.3  kg)  05/07/23 117 lb 6.4 oz (53.3 kg)  04/11/23 117 lb (53.1 kg)    Constitutional: No acute distress Eyes: sclera non-icteric, normal conjunctiva and lids ENMT: normal dentition, moist mucous membranes Cardiovascular: regular rhythm, normal rate, no murmur. S1 and S2 normal. No jugular venous distention.  Respiratory: clear to auscultation bilaterally GI : normal bowel sounds, soft and nontender. No distention.   MSK: extremities warm, well perfused. No edema.  NEURO: grossly nonfocal exam, moves all extremities. PSYCH: alert and oriented x 3, normal  mood and affect.   ASSESSMENT:    No diagnosis found.  PLAN:    No diagnosis found.  Patient's primary concern today is palpitations and tachycardia.  Given her medical history this warrants evaluation for atrial fibrillation.  In addition she has had up titration of beta-blockade with evidence of development of Mobitz 1 AV block.  I believe she warrants a cardiac monitor to screen for A-fib and to monitor for high-grade AV block.  If no high-grade AV block found she can likely remain on her dose of carvedilol if it is treating her blood pressure well.  She may benefit from slightly permissive elevated blood pressure given that she also has some orthostasis with quick position change.  Blood pressure goal of less than 140/90 is likely the appropriate strategy given age and overall medical history.  We will review results from an echocardiogram that we will obtain now to assess palpitations in more detail, and make further medication recommendations to follow.  Will order an event monitor for 30 days to screen arrhythmia.  Patient has no current chest pain and has stable known possibly even improving shortness of breath with activity.  Though she has dense three-vessel coronary artery calcifications on her recent PET/CT, we will await more concerning symptoms prior to an ischemic evaluation.  Agree with continuing aspirin and statin.  Okay to  continue lisinopril given current blood pressure, could even consider uptitrating this dose for slightly better blood pressure control.  Total time of encounter: 45 minutes total time of encounter, including 30 minutes spent in face-to-face patient care on the date of this encounter. This time includes coordination of care and counseling regarding above mentioned problem list. Remainder of non-face-to-face time involved reviewing chart documents/testing relevant to the patient encounter and documentation in the medical record. I have independently reviewed documentation from referring provider.   Weston Brass, MD, [facility]  [facility]   Shared Decision Making/Informed Consent:       Medication Adjustments/Labs and Tests Ordered: Current medicines are reviewed at length with the patient today.  Concerns regarding medicines are outlined above.   No orders of the defined types were placed in this encounter.   No orders of the defined types were placed in this encounter.   There are no Patient Instructions on file for this visit.

## 2023-06-23 NOTE — Progress Notes (Unsigned)
[facility] OFFICE PROGRESS NOTE  Frederica Kuster, MD [address] [place] [address]  DIAGNOSIS:  1) stage Ib (T2 a, N0, M0) non-small cell lung cancer, adenocarcinoma predominantly acinar with lipidic features diagnosed in August 2020. Molecular studies showed negative for EGFR mutation and negative PD-L1 expression. 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  PRIOR THERAPY: Status post right upper lobectomy with lymph node dissection in [place]).   CURRENT THERAPY: Observation   INTERVAL HISTORY: Jacqueline Hunt 89 y.o. female returns to the clinic today for a follow-up visit.  The patient was last seen on 01/30/2023 by Dr. Arbutus Ped.  She did not have any concerning complaints at that time.  However, she did have a PET scan at that time which showed focal radiotracer activity in the loops of the small bowel in the right lower quadrant concerning for primary neuroendocrine tumor in addition to multifocal radiotracer avid hepatic metastases and solitary large nodal metastasis ventral to the aorta.  Given that the patient is asymptomatic and her age, she was given the option of observation until she became symptomatic versus proceeding with treatment with monthly octreotide injections.  The patient was not interested in treatment as she was asymptomatic.   In the interval since last being seen, she had sent a Mychart message about uncontrollable diarrhea and abdominal pain.  The patient tells me today that this only occurred 1 time.  However, she was worried about it because when she had diarrhea 1 other time in her life she was diagnosed with a brain tumor the following day. She is concerned that this is related to her neuroendocrine tumor.   A few days later, she went to a urgent care for a urinary tract infection workup and hip pain.  She reported 4-day history of urinary frequency and bladder pressure.   She also reported right hip pain radiating down her upper thigh.  The pain resolves with laying down and elevating legs.  She takes Tylenol for arthritis. Her x-ray was negative for hip fracture or dislocation.  States that her PCP allows her to take Aleve 1 time per day if needed.  She also mentions she has arthritis in her spine. She  Her UA was negative for infection.  She mentions that she has chronic neck pain and has history of arthritis.  She is wondering if she can be referred to pain management.  Denies any recent falls or injuries.    Today, she is feeling well.   She denies any fever, chills, or night sweats.  She reports she has a good appetite but she did lose a couple pounds.  She denies any abdominal pain at this time.  Denies any unusual shortness of breath besides her normal dyspnea on exertion since having a lobectomy.  Denies any cough, chest pain, or hemoptysis.  She felt nauseated with the pain few days ago.  Denies any vomiting.  Denies any jaundice or itching.  Denies any flushing or wheezing.   Of note, the patient states that she recently saw her cardiologist and she is scheduled to start a new antihypertensive in a few days.  Her blood pressures little elevated today because she has not picked up her new prescription at this time.  She is here today for evaluation and repeat blood work.  MEDICAL HISTORY: Past Medical History:  Diagnosis Date   Arthritis    Brain tumor (HCC)    Constipation due  to opioid therapy    hospitalized d/t constipation   Dyspnea    High blood pressure    Lung cancer (HCC)    Osteopenia    Osteoporosis    Parathyroid adenoma     ALLERGIES:  has No Known Allergies.  MEDICATIONS:  Current Outpatient Medications  Medication Sig Dispense Refill   acetaminophen (TYLENOL) 650 MG CR tablet Take 650 mg by mouth in the morning and at bedtime.     aspirin EC 81 MG tablet Take 81 mg by mouth daily.     Cholecalciferol (VITAMIN D3 PO) Take 500 mg by  mouth daily.     Doxylamine Succinate, Sleep, (UNISOM PO) Take by mouth.     estradiol (CLIMARA - DOSED IN MG/24 HR) 0.05 mg/24hr patch Place 1 patch (0.05 mg total) onto the skin once a week. 12 patch 3   latanoprost (XALATAN) 0.005 % ophthalmic solution Place 1 drop into both eyes at bedtime.     lisinopril (ZESTRIL) 10 MG tablet TAKE 1 TABLET BY MOUTH TWICE A DAY 180 tablet 1   Multiple Vitamins-Minerals (DAILY MULTIVITAMIN PO) Take 1 tablet by mouth daily.     Naproxen Sodium (ALEVE) 220 MG CAPS Take 1 capsule by mouth daily as needed.     rosuvastatin (CRESTOR) 5 MG tablet TAKE 1 TABLET BY MOUTH EVERY DAY IN THE EVENING 90 tablet 1   vitamin B-12 (CYANOCOBALAMIN) 500 MCG tablet Take 500 mcg by mouth daily.     No current facility-administered medications for this visit.    SURGICAL HISTORY:  Past Surgical History:  Procedure Laterality Date   brain meningioma excesion     CATARACT EXTRACTION     CESAREAN SECTION     GALLBLADDER SURGERY     gallbladder surgery      LOBECTOMY     TONSILLECTOMY     TONSILLECTOMY     WRIST FRACTURE SURGERY      REVIEW OF SYSTEMS:   Review of Systems  Constitutional: Positive for mild weight loss. Negative for appetite change, chills, fatigue, fever and unexpected weight change.  HENT: Negative for mouth sores, nosebleeds, sore throat and trouble swallowing.   Eyes: Negative for eye problems and icterus.  Respiratory: Positive for stable dyspnea on exertion. Negative for cough, hemoptysis, and wheezing.   Cardiovascular: Negative for chest pain and leg swelling.  Gastrointestinal: Negative for abdominal pain, constipation, diarrhea (resolved), nausea (resolved) and vomiting.  Genitourinary: Negative for bladder incontinence, difficulty urinating, dysuria, frequency and hematuria.   Musculoskeletal: Positive for chronic cervical spine pain and right hip pain. Negative for back pain, gait problem, neck pain and neck stiffness.  Skin: Negative for  itching and rash.  Neurological: Negative for dizziness, extremity weakness, gait problem, headaches, light-headedness and seizures.  Hematological: Negative for adenopathy. Does not bruise/bleed easily.  Psychiatric/Behavioral: Negative for confusion, depression and sleep disturbance. The patient is not nervous/anxious.     PHYSICAL EXAMINATION:  Blood pressure (!) 162/98, pulse 98, temperature 97.7 F (36.5 C), temperature source Temporal, resp. rate 17, weight 112 lb 3.2 oz (50.9 kg), SpO2 98%.  ECOG PERFORMANCE STATUS: 1  Physical Exam  Constitutional: Oriented to person, place, and time and elderly appearing and in no distress.   HENT:  Head: Normocephalic and atraumatic.  Mouth/Throat: Oropharynx is clear and moist. No oropharyngeal exudate.  Eyes: Conjunctivae are normal. Right eye exhibits no discharge. Left eye exhibits no discharge. No scleral icterus.  Neck: Normal range of motion. Neck supple.  Cardiovascular: Normal rate,  regular rhythm, normal heart sounds and intact distal pulses.   Pulmonary/Chest: Effort normal and breath sounds normal. No respiratory distress. No wheezes. No rales.  Abdominal: Soft. Bowel sounds are normal. Exhibits no distension and no mass. There is no tenderness.  Musculoskeletal: Normal range of motion. Exhibits no edema.  Lymphadenopathy:    No cervical adenopathy.  Neurological: Alert and oriented to person, place, and time. Exhibits muscle wasting. Gait normal. Coordination normal. Uses a cane.  Skin: Skin is warm and dry. No rash noted. Not diaphoretic. No erythema. No pallor.  Psychiatric: Mood, memory and judgment normal.  Vitals reviewed.  LABORATORY DATA: Lab Results  Component Value Date   WBC 4.5 06/25/2023   HGB 13.3 06/25/2023   HCT 38.8 06/25/2023   MCV 91.9 06/25/2023   PLT 142 (L) 06/25/2023      Chemistry      Component Value Date/Time   NA 138 06/25/2023 1018   K 4.2 06/25/2023 1018   CL 102 06/25/2023 1018   CO2 29  06/25/2023 1018   BUN 21 06/25/2023 1018   BUN 20 03/12/2020 0000   CREATININE 0.77 06/25/2023 1018   CREATININE 0.67 08/30/2020 1512   GLU 179 03/12/2020 0000      Component Value Date/Time   CALCIUM 9.4 06/25/2023 1018   ALKPHOS 46 06/25/2023 1018   AST 19 06/25/2023 1018   ALT 20 06/25/2023 1018   BILITOT 0.5 06/25/2023 1018       RADIOGRAPHIC STUDIES:  DG Hip Unilat With Pelvis 2-3 Views Right  Result Date: 06/22/2023 CLINICAL DATA:  Right hip pain. EXAM: DG HIP (WITH OR WITHOUT PELVIS) 2-3V RIGHT COMPARISON:  None Available. FINDINGS: There is no evidence of hip fracture or dislocation. There is no evidence of arthropathy or other focal bone abnormality. IMPRESSION: Negative. Electronically Signed   By: Lupita Raider M.D.   On: 06/22/2023 14:08   CARDIAC EVENT MONITOR  Result Date: 06/22/2023 Impressions: Second degree AVB mobitz II dectected on at least 5 occasions throughout the day. Otherwise second degree AVB Mobitz I detected. No atrial fibrillation. 9 beat episode of nonsustained VT. No symptoms documented.     ASSESSMENT/PLAN:  This is a very pleasant 89 year old Caucasian female diagnosed with: 1) stage Ib (T2a, N0, M0) non-small cell lung cancer, adenocarcinoma which is predominantly acinar with lipidic features.  She was diagnosed in August 2020.  She is status post right upper lobectomy with lymph node dissection [place].  Her molecular studies are negative for EGFR mutation negative PD-L1 expression she is on observation from her lung cancer. 2) well-differentiated neuroendocrine carcinoma of gastrointestinal primary.  Was seen by Dr. Lattie Corns at [facility] cancer center who recommended PET dotatate scan and treatment with octreotide if she becomes symptomatic.  Her staging PET scan showed radiotracer activity in the right lower quadrant concerning for a primary neuroendocrine tumor in addition to multifocal radiotracer avid hepatic metastases and solitary  large nodal metastasis ventral to the aorta.  The patient sent a MyChart message recently reporting diarrhea.  Her symptoms only occurred once. Denies persistent diarrhea.   She had repeat CBC, CMP, and chromogranin A testing performed today.  The patient was seen with Dr. Arbutus Ped today. Her CBC and CMP were WNL. Her chromogranin A is pending. I had ordered GI pathogen panel testing and C. difficile testing to rule out other etiologies of her diarrhea. However, her stools are now soft formed and normal. Therefore, she does not need this test at  this time.  Given that her diarrhea only occurred one time and lack of continuous symptoms, likely unrelated to her neuroendocrine tumor. However, if her diarrhea becomes more persistent, then we can consider starting treatment.   She is scheduled to follow up early September. We will keep that appointment as scheduled. We will arrange for a restaging CT scan of the chest, abdomen, and pelvis prior this this appointment.   We will refer the patient to orthopedics per patient request to see what her options are regarding her cervical arthritis and hip pain.   Her hypertension, she will pick up her new antihypertensive prescription and start taking this as directed by her cardiologist  The patient was advised to call immediately if she has any concerning symptoms in the interval. The patient voices understanding of current disease status and treatment options and is in agreement with the current care plan. All questions were answered. The patient knows to call the clinic with any problems, questions or concerns. We can certainly see the patient much sooner if necessary   Orders Placed This Encounter  Procedures   CT CHEST ABDOMEN PELVIS W CONTRAST    Standing Status:   Future    Standing Expiration Date:   06/24/2024    Order Specific Question:   If indicated for the ordered procedure, I authorize the administration of contrast media per Radiology  protocol    Answer:   Yes    Order Specific Question:   Does the patient have a contrast media/X-ray dye allergy?    Answer:   No    Order Specific Question:   Preferred imaging location?    Answer:   [facility]    Order Specific Question:   If indicated for the ordered procedure, I authorize the administration of oral contrast media per Radiology protocol    Answer:   Yes   CBC with Differential (Cancer Center Only)    Standing Status:   Future    Standing Expiration Date:   06/24/2024   CMP (Cancer Center only)    Standing Status:   Future    Standing Expiration Date:   06/24/2024   Chromogranin A    Standing Status:   Future    Standing Expiration Date:   06/24/2024      Johnette Abraham Gertude Benito, PA-C 06/25/23  ADDENDUM: Hematology/Oncology Attending: I had a face-to-face encounter with the patient today.  I reviewed her records, lab and recommended her care plan.  This is a very pleasant 89 years old white female with history of a stage Ib non-small cell lung cancer diagnosed in August 2020 as well as well-differentiated neuroendocrine carcinoma, carcinoid tumor of gastrointestinal origin involving the liver diagnosed in December 2022. The patient is status post right upper lobectomy with lymph node dissection [place] at the time of her diagnosis of the lung cancer and currently on observation.  She has been feeling fine with no concerning complaints except for 1 recent episode of diarrhea.  The patient was concerned about connection to her neuroendocrine tumor and requested to be seen urgently for evaluation.  She did not have any other recurrent episodes since that time and she is feeling fine.  She has no current nausea, vomiting, diarrhea or constipation. Her blood work today is unremarkable.  I assured the patient that this could be a coincidental event and may not be related to her neuroendocrine tumor but will continue with close monitoring for her.  I recommended  for the  patient to have repeat CT scan of the chest, abdomen and pelvis as previously planned in September 2024.  She will come back for follow-up visit at that time.  The patient was advised to call immediately if she has any other concerning symptoms in the interval. The total time spent in the appointment was 30 minutes. Disclaimer: This note was dictated with voice recognition software. Similar sounding words can inadvertently be transcribed and may be missed upon review. Lajuana Matte, MD

## 2023-06-25 ENCOUNTER — Other Ambulatory Visit: Payer: Self-pay | Admitting: Physician Assistant

## 2023-06-25 ENCOUNTER — Inpatient Hospital Stay: Payer: Medicare HMO | Admitting: Physician Assistant

## 2023-06-25 ENCOUNTER — Inpatient Hospital Stay: Payer: Medicare HMO | Attending: Physician Assistant

## 2023-06-25 VITALS — BP 162/98 | HR 98 | Temp 97.7°F | Resp 17 | Wt 112.2 lb

## 2023-06-25 DIAGNOSIS — C3491 Malignant neoplasm of unspecified part of right bronchus or lung: Secondary | ICD-10-CM

## 2023-06-25 DIAGNOSIS — M479 Spondylosis, unspecified: Secondary | ICD-10-CM | POA: Insufficient documentation

## 2023-06-25 DIAGNOSIS — R634 Abnormal weight loss: Secondary | ICD-10-CM | POA: Insufficient documentation

## 2023-06-25 DIAGNOSIS — I10 Essential (primary) hypertension: Secondary | ICD-10-CM | POA: Diagnosis not present

## 2023-06-25 DIAGNOSIS — C3411 Malignant neoplasm of upper lobe, right bronchus or lung: Secondary | ICD-10-CM | POA: Diagnosis not present

## 2023-06-25 DIAGNOSIS — R197 Diarrhea, unspecified: Secondary | ICD-10-CM | POA: Diagnosis not present

## 2023-06-25 DIAGNOSIS — Z79899 Other long term (current) drug therapy: Secondary | ICD-10-CM | POA: Insufficient documentation

## 2023-06-25 DIAGNOSIS — G8929 Other chronic pain: Secondary | ICD-10-CM | POA: Insufficient documentation

## 2023-06-25 DIAGNOSIS — R0609 Other forms of dyspnea: Secondary | ICD-10-CM | POA: Insufficient documentation

## 2023-06-25 DIAGNOSIS — C7B8 Other secondary neuroendocrine tumors: Secondary | ICD-10-CM | POA: Diagnosis not present

## 2023-06-25 DIAGNOSIS — Z902 Acquired absence of lung [part of]: Secondary | ICD-10-CM | POA: Diagnosis not present

## 2023-06-25 DIAGNOSIS — C7A8 Other malignant neuroendocrine tumors: Secondary | ICD-10-CM

## 2023-06-25 DIAGNOSIS — Z9089 Acquired absence of other organs: Secondary | ICD-10-CM | POA: Insufficient documentation

## 2023-06-25 DIAGNOSIS — I441 Atrioventricular block, second degree: Secondary | ICD-10-CM | POA: Insufficient documentation

## 2023-06-25 DIAGNOSIS — M25551 Pain in right hip: Secondary | ICD-10-CM | POA: Insufficient documentation

## 2023-06-25 DIAGNOSIS — M25559 Pain in unspecified hip: Secondary | ICD-10-CM

## 2023-06-25 DIAGNOSIS — M542 Cervicalgia: Secondary | ICD-10-CM | POA: Diagnosis not present

## 2023-06-25 DIAGNOSIS — M47812 Spondylosis without myelopathy or radiculopathy, cervical region: Secondary | ICD-10-CM | POA: Insufficient documentation

## 2023-06-25 DIAGNOSIS — R109 Unspecified abdominal pain: Secondary | ICD-10-CM | POA: Diagnosis not present

## 2023-06-25 LAB — CMP (CANCER CENTER ONLY)
ALT: 20 U/L (ref 0–44)
AST: 19 U/L (ref 15–41)
Albumin: 4.2 g/dL (ref 3.5–5.0)
Alkaline Phosphatase: 46 U/L (ref 38–126)
Anion gap: 7 (ref 5–15)
BUN: 21 mg/dL (ref 8–23)
CO2: 29 mmol/L (ref 22–32)
Calcium: 9.4 mg/dL (ref 8.9–10.3)
Chloride: 102 mmol/L (ref 98–111)
Creatinine: 0.77 mg/dL (ref 0.44–1.00)
GFR, Estimated: >60 mL/min (ref >60)
Glucose, Bld: 95 mg/dL (ref 70–99)
Potassium: 4.2 mmol/L (ref 3.5–5.1)
Sodium: 138 mmol/L (ref 135–145)
Total Bilirubin: 0.5 mg/dL (ref 0.3–1.2)
Total Protein: 6.4 g/dL — ABNORMAL LOW (ref 6.5–8.1)

## 2023-06-25 LAB — CBC WITH DIFFERENTIAL (CANCER CENTER ONLY)
Abs Immature Granulocytes: 0.01 10*3/uL (ref 0.00–0.07)
Basophils Absolute: 0 10*3/uL (ref 0.0–0.1)
Basophils Relative: 0 %
Eosinophils Absolute: 0.1 10*3/uL (ref 0.0–0.5)
Eosinophils Relative: 2 %
HCT: 38.8 % (ref 36.0–46.0)
Hemoglobin: 13.3 g/dL (ref 12.0–15.0)
Immature Granulocytes: 0 %
Lymphocytes Relative: 22 %
Lymphs Abs: 1 10*3/uL (ref 0.7–4.0)
MCH: 31.5 pg (ref 26.0–34.0)
MCHC: 34.3 g/dL (ref 30.0–36.0)
MCV: 91.9 fL (ref 80.0–100.0)
Monocytes Absolute: 0.3 10*3/uL (ref 0.1–1.0)
Monocytes Relative: 7 %
Neutro Abs: 3.2 10*3/uL (ref 1.7–7.7)
Neutrophils Relative %: 69 %
Platelet Count: 142 10*3/uL — ABNORMAL LOW (ref 150–400)
RBC: 4.22 MIL/uL (ref 3.87–5.11)
RDW: 12.8 % (ref 11.5–15.5)
WBC Count: 4.5 10*3/uL (ref 4.0–10.5)
nRBC: 0 % (ref 0.0–0.2)

## 2023-06-27 ENCOUNTER — Telehealth: Payer: Self-pay | Admitting: Internal Medicine

## 2023-06-27 NOTE — Telephone Encounter (Signed)
Patient states that she feels amlodipine is not helping her. Her BP has still been high. She did just start start taking amlodipine yesterday. She his also concerned about her heart rate. Did advise that being that she just started yesterday to give the medicine a chance to work. Also to continue to monitor her BP and HR. Discussed ED precautions. Will forward to provider  130/56 hr 103  159/122 hr 108 148/107 hr 102 172/118 hr 107

## 2023-06-27 NOTE — Telephone Encounter (Signed)
Pt c/o medication issue:  1. Name of Medication: amLODIPine Besylate 5 mg Oral Daily  2. How are you currently taking this medication (dosage and times per day)?   3. Are you having a reaction (difficulty breathing--STAT)? No  4. What is your medication issue? Pt is requesting a callback regarding her feeling like this medication is not helping her at all. She stated MD told her to call into the office if any questions or concerns about it. Please advise

## 2023-06-28 NOTE — Telephone Encounter (Signed)
Patient is returning call back go talk with Dr. Jacques Navy or nurse

## 2023-06-28 NOTE — Telephone Encounter (Signed)
Patient Scheduled for Monday with DOD. Discussed ED precautions. Verbalized understanding

## 2023-06-28 NOTE — Telephone Encounter (Signed)
Patient called and stated googled amlodipine and she feel she has having a lot of its symptoms. She has been sweating, dizziness, and upset stomach and she feels its causing her heart rate to increase. She also states it says you should not take if you have liver disease and she has stage 4 liver cancer. She no longer wants to take amlodipine. She has already taken one today. Advised to hold for now and we will give her a call back with further recommendations.

## 2023-06-29 ENCOUNTER — Other Ambulatory Visit: Payer: Self-pay | Admitting: Adult Health

## 2023-06-29 NOTE — Telephone Encounter (Signed)
Requested medication is not on active medication list  

## 2023-07-02 ENCOUNTER — Ambulatory Visit: Payer: Medicare HMO | Attending: Cardiovascular Disease | Admitting: Cardiovascular Disease

## 2023-07-02 ENCOUNTER — Encounter: Payer: Self-pay | Admitting: Cardiovascular Disease

## 2023-07-02 VITALS — BP 135/90 | HR 122 | Ht 60.0 in | Wt 110.4 lb

## 2023-07-02 DIAGNOSIS — I2510 Atherosclerotic heart disease of native coronary artery without angina pectoris: Secondary | ICD-10-CM | POA: Diagnosis not present

## 2023-07-02 DIAGNOSIS — I2584 Coronary atherosclerosis due to calcified coronary lesion: Secondary | ICD-10-CM | POA: Diagnosis not present

## 2023-07-02 DIAGNOSIS — I700 Atherosclerosis of aorta: Secondary | ICD-10-CM

## 2023-07-02 DIAGNOSIS — R000 Tachycardia, unspecified: Secondary | ICD-10-CM | POA: Diagnosis not present

## 2023-07-02 DIAGNOSIS — I441 Atrioventricular block, second degree: Secondary | ICD-10-CM

## 2023-07-02 MED ORDER — CARVEDILOL 3.125 MG PO TABS: 3.1250 mg | ORAL_TABLET | Freq: Two times a day (BID) | ORAL | 3 refills | Status: DC

## 2023-07-02 NOTE — Patient Instructions (Addendum)
Medication Instructions:  Decrease Carvedilol to 3.125 mg twice a day Continue all other medications *If you need a refill on your cardiac medications before your next appointment, please call your pharmacy*   Lab Work: None ordered   Testing/Procedures: None ordered   Follow-Up: At [facility], you and your health needs are our priority.  As part of our continuing mission to provide you with exceptional heart care, we have created designated Provider Care Teams.  These Care Teams include your primary Cardiologist (physician) and Advanced Practice Providers (APPs -  Physician Assistants and Nurse Practitioners) who all work together to provide you with the care you need, when you need it.  We recommend signing up for the patient portal called "MyChart".  Sign up information is provided on this After Visit Summary.  MyChart is used to connect with patients for Virtual Visits (Telemedicine).  Patients are able to view lab/test results, encounter notes, upcoming appointments, etc.  Non-urgent messages can be sent to your provider as well.   To learn more about what you can do with MyChart, go to [email or web address].    Your next appointment:  Keep appointment Thurs 8/22 at 10:00 am    Provider:  Dr.Acharya   Check Blood Pressure daily send readings to Dr.Acharya Fri 8/9

## 2023-07-02 NOTE — Progress Notes (Signed)
Cardiology Office Note:  .   Date:  07/08/2023  ID:  Jacqueline Hunt, DOB Jan 29, 1928, MRN [id number] PCP: Frederica Kuster, MD  [facility] Providers Cardiologist:  Parke Poisson, MD    History of Present Illness: .   Jacqueline Hunt is a 89 y.o. female with a history of hypercholesterolemia, hypertension and asymptomatic second-degree AV block who has had recent problems with management of her blood pressure and tachycardia.  She brings a detailed record of her heart rate and blood pressure readings.  Over the last couple of weeks her typical blood pressure has been 130-150/90-108 and her heart rate has consistently been over 100 bpm, usually over 110 bpm.  She stopped taking carvedilol about 2 weeks ago.  While she was taking the medication her blood pressure was still high with diastolics in the 90s, but her heart rate was typically in the 60-90 range.  ECG today shows sinus tachycardia with occasional PVCs.  The heart rate is 122 bpm.  There is 1: 1 AV conduction.  Her problems started when she had to discontinue carvedilol 12.5 mg twice daily because her event monitor disclosed evidence of second-degree AV block.  It was interpreted as showing both Mobitz type I and Mobitz type II AV block, but him on my review of the tracings all of the episodes of AV block can be explained by Mobitz type I/Wenckebach mechanism.  Sometimes she has 2: 1 AV block, but there is never high-grade AV block.  Her monitor also showed a 9 beat episode of nonsustained VT.  Her Apple Watch has told her once that she had atrial fibrillation, but this was not seen on the wearable event monitor.  She is currently taking lisinopril 10 mg twice daily.  She started amlodipine 5 mg daily on July 30, when she stopped the carvedilol.  She complains of becoming very easily dyspneic ever since she underwent a right upper lobectomy for lung cancer.  She has "stage IV liver cancer".  Also has evidence of severe  coronary artery calcifications and aortic atherosclerosis on PET/CT studies.  She has never complained of chest pain either at rest or with activity  ROS: She has never experienced syncope.  She had a few presyncopal events many years ago with a pattern suggesting a vasovagal events. Studies Reviewed: Marland Kitchen   EKG Interpretation Date/Time:  Monday July 02 2023 16:49:02 EDT Ventricular Rate:  113 PR Interval:  164 QRS Duration:  82 QT Interval:  338 QTC Calculation: 463 R Axis:   -46  Text Interpretation: Sinus tachycardia with occasional Premature ventricular complexes Left anterior fascicular block Cannot rule out Inferior infarct (masked by fascicular block?) , age undetermined Anterior infarct , age undetermined No previous ECGs available Confirmed by , Rachelle Hora [phone]) on 07/02/2023 5:16:46 PM  I personally reviewed her event monitor from 06/20/2023.  There are episodes of 2: 1 AV block, but I see no evidence of high-grade AV block.  The QRS is always narrow doing periods of second-degree AV block.  I think she has Mobitz type I/Wenckebach cycles.  There was no evidence of severe bradycardia or pauses (the minimum heart rate was 50 bpm during sinus bradycardia with 1: 1 AV conduction), the minimum heart rate was 40 bpm during sinus rhythm with 2: 1 AV block.  These episodes of heart rate in the 40-50 range all occurred during expected sleep hours   Risk Assessment/Calculations:          Physical Exam:  VS:  BP (!) 135/90 (BP Location: Left Arm, Patient Position: Sitting, Cuff Size: Small)   Pulse (!) 122   Ht 5' (1.524 m)   Wt 110 lb 6.4 oz (50.1 kg)   SpO2 97%   BMI 21.56 kg/m    Wt Readings from Last 3 Encounters:  07/02/23 110 lb 6.4 oz (50.1 kg)  06/25/23 112 lb 3.2 oz (50.9 kg)  06/22/23 111 lb (50.3 kg)    GEN: Well nourished, well developed in no acute distress.  Appears very lean NECK: No JVD; No carotid bruits CARDIAC: Tachycardia, occasional ectopic beats on a  background of RRR, no murmurs, rubs, gallops RESPIRATORY:  Clear to auscultation without rales, wheezing or rhonchi  ABDOMEN: Soft, non-tender, non-distended EXTREMITIES:  No edema; No deformity   ASSESSMENT AND PLAN: .   Second-degree AV block: On my review, I think this is exclusively due to suprahisian block.  The highest grade is 2: 1.  The QRS is always narrow.  The events happen exclusively during expected sleep hour.  There is never severe bradycardia.  The episodes are not symptomatic.  Her priority is clearly on quality of life and the tachycardia and PVC related palpitations is more bothersome and the bradycardia.  I suggested we can safely use a smaller dose of beta-blocker while monitoring for symptomatic bradycardia.  She wears a smart watch.  Will resume her carvedilol at a lower dose of 3.125 mg twice daily. HTN: Has been difficult to control ever since she stopped her carvedilol, but some of the readings are likely erroneous due to the irregular rhythm.  Continue lisinopril and amlodipine.  Start carvedilol 3.25 mg twice daily.  There is room to increase both the lisinopril and the amlodipine dose if necessary.  Bring a log of her blood pressure to her follow-up appointment. Coronary calcification/aortic atherosclerosis: Asymptomatic.  Her overall prognosis is related to her malignancy.  Aggressive evaluation does not appear to be indicated.  She is on aspirin and statin.       Dispo:  Patient Instructions  Medication Instructions:  Decrease Carvedilol to 3.125 mg twice a day Continue all other medications *If you need a refill on your cardiac medications before your next appointment, please call your pharmacy*   Lab Work: None ordered   Testing/Procedures: None ordered   Follow-Up: At [facility], you and your health needs are our priority.  As part of our continuing mission to provide you with exceptional heart care, we have created designated Provider Care  Teams.  These Care Teams include your primary Cardiologist (physician) and Advanced Practice Providers (APPs -  Physician Assistants and Nurse Practitioners) who all work together to provide you with the care you need, when you need it.  We recommend signing up for the patient portal called "MyChart".  Sign up information is provided on this After Visit Summary.  MyChart is used to connect with patients for Virtual Visits (Telemedicine).  Patients are able to view lab/test results, encounter notes, upcoming appointments, etc.  Non-urgent messages can be sent to your provider as well.   To learn more about what you can do with MyChart, go to [email or web address].    Your next appointment:  Keep appointment Thurs 8/22 at 10:00 am    Provider:  Dr.Acharya   Check Blood Pressure daily send readings to Dr.Acharya Fri 8/9     Signed, Thurmon Fair, MD

## 2023-07-06 ENCOUNTER — Encounter: Payer: Self-pay | Admitting: Internal Medicine

## 2023-07-06 DIAGNOSIS — H401131 Primary open-angle glaucoma, bilateral, mild stage: Secondary | ICD-10-CM | POA: Diagnosis not present

## 2023-07-06 DIAGNOSIS — Z961 Presence of intraocular lens: Secondary | ICD-10-CM | POA: Diagnosis not present

## 2023-07-06 DIAGNOSIS — H524 Presbyopia: Secondary | ICD-10-CM | POA: Diagnosis not present

## 2023-07-06 DIAGNOSIS — H348122 Central retinal vein occlusion, left eye, stable: Secondary | ICD-10-CM | POA: Diagnosis not present

## 2023-07-07 DIAGNOSIS — Z0100 Encounter for examination of eyes and vision without abnormal findings: Secondary | ICD-10-CM | POA: Diagnosis not present

## 2023-07-19 ENCOUNTER — Ambulatory Visit: Payer: Medicare HMO | Attending: Internal Medicine | Admitting: Internal Medicine

## 2023-07-19 ENCOUNTER — Encounter: Payer: Self-pay | Admitting: Internal Medicine

## 2023-07-19 ENCOUNTER — Ambulatory Visit: Payer: Medicare HMO | Admitting: Internal Medicine

## 2023-07-19 VITALS — BP 110/76 | HR 68 | Ht 60.0 in | Wt 111.6 lb

## 2023-07-19 DIAGNOSIS — R002 Palpitations: Secondary | ICD-10-CM | POA: Diagnosis not present

## 2023-07-19 DIAGNOSIS — I10 Essential (primary) hypertension: Secondary | ICD-10-CM

## 2023-07-19 DIAGNOSIS — R000 Tachycardia, unspecified: Secondary | ICD-10-CM | POA: Diagnosis not present

## 2023-07-19 NOTE — Patient Instructions (Signed)
Medication Instructions:  Your physician recommends that you continue on your current medications as directed. Please refer to the Current Medication list given to you today.  *If you need a refill on your cardiac medications before your next appointment, please call your pharmacy*  Follow-Up: At [facility], you and your health needs are our priority.  As part of our continuing mission to provide you with exceptional heart care, we have created designated Provider Care Teams.  These Care Teams include your primary Cardiologist (physician) and Advanced Practice Providers (APPs -  Physician Assistants and Nurse Practitioners) who all work together to provide you with the care you need, when you need it.  We recommend signing up for the patient portal called "MyChart".  Sign up information is provided on this After Visit Summary.  MyChart is used to connect with patients for Virtual Visits (Telemedicine).  Patients are able to view lab/test results, encounter notes, upcoming appointments, etc.  Non-urgent messages can be sent to your provider as well.   To learn more about what you can do with MyChart, go to [email or web address].    Your next appointment:   6 month(s)  Provider:   Gayatri A Acharya, MD      

## 2023-07-19 NOTE — Progress Notes (Signed)
Cardiology Office Note:  .   Date:  07/20/2023  ID:  Jacqueline Hunt, DOB [birth year], MRN [id number] PCP: [name], MD  [facility] Providers Cardiologist:  Parke Poisson, MD     History of Present Illness: .    Jacqueline Hunt is a 89 y.o. female with a history of hypercholesterolemia, hypertension and asymptomatic second-degree AV block who has had recent problems with management of her blood pressure and tachycardia.  Since her last visit she was seen by my colleague Dr. Royann Shivers.  He felt her episodes on monitor were more consistent with 2-1 conduction though Mobitz type I Wenckebach cycles.  Appreciate his opinion and expertise.  In light of this she was restarted on a low-dose of carvedilol given symptomatic tachycardia and hypertension.  She has felt much better on low-dose carvedilol and today on EKG demonstrates Mobitz 1 second-degree AV block.  No episodes of dizziness, lightheadedness, or syncope.  She brings a BP log with her today which shows grossly normal readings and normal heart rates.  We participated in shared decision making and will continue the current regimen as set out by her last visit with Dr. Royann Shivers.  She is comfortable with this plan.   Studies Reviewed: Marland Kitchen   EKG Interpretation Date/Time:  Thursday July 19 2023 10:11:45 EDT Ventricular Rate:  68 PR Interval:    QRS Duration:  84 QT Interval:  416 QTC Calculation: 442 R Axis:   -41  Text Interpretation: Sinus rhythm with 2nd degree A-V block (Mobitz I) Left axis deviation Low voltage QRS Confirmed by Weston Brass ([id number]) on 07/19/2023 10:36:25 AM     Risk Assessment/Calculations:          Physical Exam:   VS:  BP 110/76 (BP Location: Left Arm, Patient Position: Sitting, Cuff Size: Normal)   Pulse 68   Ht 5' (1.524 m)   Wt 111 lb 9.6 oz (50.6 kg)   SpO2 96%   BMI 21.80 kg/m    Wt Readings from Last 3 Encounters:  07/19/23 111 lb 9.6 oz (50.6 kg)  07/02/23 110 lb 6.4 oz  (50.1 kg)  06/25/23 112 lb 3.2 oz (50.9 kg)    GEN: Well nourished, well developed in no acute distress.   NECK: No JVD; No carotid bruits CARDIAC: RRR, no murmurs. RESPIRATORY:  Clear to auscultation without rales, wheezing or rhonchi  ABDOMEN: Soft, non-tender, non-distended EXTREMITIES:  No edema; No deformity   ASSESSMENT AND PLAN: .   Second-degree AV block: She has resumed her carvedilol at a lower dose of 3.125 mg twice daily, and is comfortable with this dosing.  No worrisome findings on EKG today nor by exam or report. HTN: Continue current regimen of lisinopril 10 mg daily, amlodipine 5 mg daily, and carvedilol 3.125 twice daily.  She is currently splitting pills of her prior carvedilol prescription, but she feels certain this is a dose of 3.125 mg twice daily.  I have asked her to confirm when she returns home. Coronary calcification/aortic atherosclerosis: Asymptomatic.  We had considered further testing, but she defers.       Patient Instructions  Medication Instructions:  Your physician recommends that you continue on your current medications as directed. Please refer to the Current Medication list given to you today.  *If you need a refill on your cardiac medications before your next appointment, please call your pharmacy*    Follow-Up: At [facility], you and your health needs are our priority.  As part of our  continuing mission to provide you with exceptional heart care, we have created designated Provider Care Teams.  These Care Teams include your primary Cardiologist (physician) and Advanced Practice Providers (APPs -  Physician Assistants and Nurse Practitioners) who all work together to provide you with the care you need, when you need it.  We recommend signing up for the patient portal called "MyChart".  Sign up information is provided on this After Visit Summary.  MyChart is used to connect with patients for Virtual Visits (Telemedicine).  Patients are able  to view lab/test results, encounter notes, upcoming appointments, etc.  Non-urgent messages can be sent to your provider as well.   To learn more about what you can do with MyChart, go to [email or web address].    Your next appointment:   6 month(s)  Provider:   Parke Poisson, MD

## 2023-07-23 ENCOUNTER — Encounter (HOSPITAL_COMMUNITY): Payer: Self-pay

## 2023-07-23 ENCOUNTER — Ambulatory Visit (HOSPITAL_COMMUNITY)
Admission: RE | Admit: 2023-07-23 | Discharge: 2023-07-23 | Disposition: A | Discharge status: Home / Self Care | Payer: Medicare HMO | Source: Ambulatory Visit | Attending: Physician Assistant | Admitting: Physician Assistant

## 2023-07-23 DIAGNOSIS — C7B8 Other secondary neuroendocrine tumors: Secondary | ICD-10-CM | POA: Diagnosis not present

## 2023-07-23 DIAGNOSIS — I700 Atherosclerosis of aorta: Secondary | ICD-10-CM | POA: Diagnosis not present

## 2023-07-23 DIAGNOSIS — R197 Diarrhea, unspecified: Secondary | ICD-10-CM

## 2023-07-23 DIAGNOSIS — C3491 Malignant neoplasm of unspecified part of right bronchus or lung: Secondary | ICD-10-CM

## 2023-07-23 DIAGNOSIS — C7A8 Other malignant neuroendocrine tumors: Secondary | ICD-10-CM | POA: Diagnosis not present

## 2023-07-23 DIAGNOSIS — C779 Secondary and unspecified malignant neoplasm of lymph node, unspecified: Secondary | ICD-10-CM | POA: Diagnosis not present

## 2023-07-23 DIAGNOSIS — C7B02 Secondary carcinoid tumors of liver: Secondary | ICD-10-CM | POA: Diagnosis not present

## 2023-07-23 MED ORDER — IOHEXOL 300 MG/ML  SOLN
100.0000 mL | Freq: Once | INTRAMUSCULAR | Status: AC | PRN
  Administered 2023-07-23: 100 mL via INTRAVENOUS

## 2023-07-23 MED ORDER — IOHEXOL 9 MG/ML PO SOLN
1000.0000 mL | Freq: Once | ORAL | Status: AC
  Administered 2023-07-23: 1000 mL via ORAL

## 2023-07-23 MED ORDER — IOHEXOL 9 MG/ML PO SOLN: 1000.0000 mL | ORAL | Status: DC

## 2023-07-23 MED ORDER — SODIUM CHLORIDE (PF) 0.9 % IJ SOLN
INTRAMUSCULAR | Status: AC
  Filled 2023-07-23: qty 50

## 2023-07-31 ENCOUNTER — Inpatient Hospital Stay: Payer: Medicare HMO | Admitting: Internal Medicine

## 2023-07-31 ENCOUNTER — Inpatient Hospital Stay: Payer: Medicare HMO | Attending: Physician Assistant

## 2023-07-31 VITALS — BP 133/93 | HR 96 | Temp 97.3°F | Resp 16 | Ht 60.0 in | Wt 112.0 lb

## 2023-07-31 DIAGNOSIS — C3411 Malignant neoplasm of upper lobe, right bronchus or lung: Secondary | ICD-10-CM | POA: Diagnosis not present

## 2023-07-31 DIAGNOSIS — I517 Cardiomegaly: Secondary | ICD-10-CM | POA: Insufficient documentation

## 2023-07-31 DIAGNOSIS — I2510 Atherosclerotic heart disease of native coronary artery without angina pectoris: Secondary | ICD-10-CM | POA: Insufficient documentation

## 2023-07-31 DIAGNOSIS — J398 Other specified diseases of upper respiratory tract: Secondary | ICD-10-CM | POA: Diagnosis not present

## 2023-07-31 DIAGNOSIS — Z79899 Other long term (current) drug therapy: Secondary | ICD-10-CM | POA: Insufficient documentation

## 2023-07-31 DIAGNOSIS — Q676 Pectus excavatum: Secondary | ICD-10-CM | POA: Diagnosis not present

## 2023-07-31 DIAGNOSIS — C7A8 Other malignant neuroendocrine tumors: Secondary | ICD-10-CM | POA: Insufficient documentation

## 2023-07-31 DIAGNOSIS — C7B8 Other secondary neuroendocrine tumors: Secondary | ICD-10-CM | POA: Diagnosis not present

## 2023-07-31 DIAGNOSIS — I700 Atherosclerosis of aorta: Secondary | ICD-10-CM | POA: Diagnosis not present

## 2023-07-31 DIAGNOSIS — Z9089 Acquired absence of other organs: Secondary | ICD-10-CM | POA: Insufficient documentation

## 2023-07-31 DIAGNOSIS — K5909 Other constipation: Secondary | ICD-10-CM | POA: Diagnosis not present

## 2023-07-31 DIAGNOSIS — Z902 Acquired absence of lung [part of]: Secondary | ICD-10-CM | POA: Insufficient documentation

## 2023-07-31 DIAGNOSIS — Q453 Other congenital malformations of pancreas and pancreatic duct: Secondary | ICD-10-CM | POA: Diagnosis not present

## 2023-07-31 DIAGNOSIS — M47814 Spondylosis without myelopathy or radiculopathy, thoracic region: Secondary | ICD-10-CM | POA: Diagnosis not present

## 2023-07-31 DIAGNOSIS — J984 Other disorders of lung: Secondary | ICD-10-CM | POA: Diagnosis not present

## 2023-07-31 LAB — CBC WITH DIFFERENTIAL (CANCER CENTER ONLY)
Abs Immature Granulocytes: 0.01 10*3/uL (ref 0.00–0.07)
Basophils Absolute: 0 10*3/uL (ref 0.0–0.1)
Basophils Relative: 0 %
Eosinophils Absolute: 0.1 10*3/uL (ref 0.0–0.5)
Eosinophils Relative: 2 %
HCT: 37.8 % (ref 36.0–46.0)
Hemoglobin: 13.1 g/dL (ref 12.0–15.0)
Immature Granulocytes: 0 %
Lymphocytes Relative: 35 %
Lymphs Abs: 1.1 10*3/uL (ref 0.7–4.0)
MCH: 32.3 pg (ref 26.0–34.0)
MCHC: 34.7 g/dL (ref 30.0–36.0)
MCV: 93.1 fL (ref 80.0–100.0)
Monocytes Absolute: 0.2 10*3/uL (ref 0.1–1.0)
Monocytes Relative: 7 %
Neutro Abs: 1.8 10*3/uL (ref 1.7–7.7)
Neutrophils Relative %: 56 %
Platelet Count: 143 10*3/uL — ABNORMAL LOW (ref 150–400)
RBC: 4.06 MIL/uL (ref 3.87–5.11)
RDW: 13 % (ref 11.5–15.5)
WBC Count: 3.2 10*3/uL — ABNORMAL LOW (ref 4.0–10.5)
nRBC: 0 % (ref 0.0–0.2)

## 2023-07-31 LAB — CMP (CANCER CENTER ONLY)
ALT: 28 U/L (ref 0–44)
AST: 26 U/L (ref 15–41)
Albumin: 4.2 g/dL (ref 3.5–5.0)
Alkaline Phosphatase: 50 U/L (ref 38–126)
Anion gap: 5 (ref 5–15)
BUN: 21 mg/dL (ref 8–23)
CO2: 31 mmol/L (ref 22–32)
Calcium: 9.6 mg/dL (ref 8.9–10.3)
Chloride: 101 mmol/L (ref 98–111)
Creatinine: 0.78 mg/dL (ref 0.44–1.00)
GFR, Estimated: >60 mL/min (ref >60)
Glucose, Bld: 80 mg/dL (ref 70–99)
Potassium: 5.2 mmol/L — ABNORMAL HIGH (ref 3.5–5.1)
Sodium: 137 mmol/L (ref 135–145)
Total Bilirubin: 0.5 mg/dL (ref 0.3–1.2)
Total Protein: 6.8 g/dL (ref 6.5–8.1)

## 2023-07-31 NOTE — Progress Notes (Signed)
[facility] [phone]   [phone]  OFFICE PROGRESS NOTE  Jacqueline Sheffield, MD [address] [place] [address]  DIAGNOSIS:  1) stage Ib (T2 a, N0, M0) non-small cell lung cancer, adenocarcinoma predominantly acinar with lipidic features diagnosed in August 2020. Molecular studies showed negative for EGFR mutation and negative PD-L1 expression. 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   PRIOR THERAPY:  Status post right upper lobectomy with lymph node dissection in [place]).   CURRENT THERAPY: Observation.  INTERVAL HISTORY: Jacqueline Hunt 89 y.o. female returns to the clinic today for follow-up visit.  The patient is feeling fine today with no concerning complaints.  She denied having any current chest pain, shortness of breath, cough or hemoptysis.  She has no nausea, vomiting, diarrhea or constipation.  She has no headache or visual changes.  She has no recent weight loss or night sweats.  The patient has no fever or chills.  She is here today for evaluation with repeat CT scan of the chest, abdomen and pelvis for restaging of her disease.  MEDICAL HISTORY: Past Medical History:  Diagnosis Date   Arthritis    Brain tumor (HCC)    Constipation due to opioid therapy    hospitalized d/t constipation   Dyspnea    High blood pressure    Lung cancer (HCC)    Osteopenia    Osteoporosis    Parathyroid adenoma     ALLERGIES:  has No Known Allergies.  MEDICATIONS:  Current Outpatient Medications  Medication Sig Dispense Refill   acetaminophen (TYLENOL) 650 MG CR tablet Take 650 mg by mouth in the morning and at bedtime.     amLODipine (NORVASC) 5 MG tablet Take 5 mg by mouth daily.     aspirin EC 81 MG tablet Take 81 mg by mouth daily.     carvedilol (COREG) 3.125 MG tablet Take 1 tablet (3.125 mg total) by mouth 2 (two) times daily. 180 tablet  3   Cholecalciferol (VITAMIN D3 PO) Take 500 mg by mouth daily.     Doxylamine Succinate, Sleep, (UNISOM PO) Take by mouth.     estradiol (CLIMARA - DOSED IN MG/24 HR) 0.05 mg/24hr patch Place 1 patch (0.05 mg total) onto the skin once a week. 12 patch 3   latanoprost (XALATAN) 0.005 % ophthalmic solution Place 1 drop into both eyes at bedtime.     lisinopril (ZESTRIL) 10 MG tablet TAKE 1 TABLET BY MOUTH TWICE A DAY 180 tablet 1   Multiple Vitamins-Minerals (DAILY MULTIVITAMIN PO) Take 1 tablet by mouth daily.     Naproxen Sodium (ALEVE) 220 MG CAPS Take 1 capsule by mouth daily as needed.     rosuvastatin (CRESTOR) 5 MG tablet TAKE 1 TABLET BY MOUTH EVERY DAY IN THE EVENING 90 tablet 1   vitamin B-12 (CYANOCOBALAMIN) 500 MCG tablet Take 500 mcg by mouth daily.     No current facility-administered medications for this visit.    SURGICAL HISTORY:  Past Surgical History:  Procedure Laterality Date   brain meningioma excesion     CATARACT EXTRACTION     CESAREAN SECTION     GALLBLADDER SURGERY     gallbladder surgery      LOBECTOMY     TONSILLECTOMY     TONSILLECTOMY     WRIST FRACTURE SURGERY      REVIEW OF SYSTEMS:  A comprehensive review of systems was  negative.   PHYSICAL EXAMINATION: General appearance: alert, cooperative, and no distress Head: Normocephalic, without obvious abnormality, atraumatic Neck: no adenopathy, no JVD, supple, symmetrical, trachea midline, and thyroid not enlarged, symmetric, no tenderness/mass/nodules Lymph nodes: Cervical, supraclavicular, and axillary nodes normal. Resp: clear to auscultation bilaterally Back: symmetric, no curvature. ROM normal. No CVA tenderness. Cardio: regular rate and rhythm, S1, S2 normal, no murmur, click, rub or gallop GI: soft, non-tender; bowel sounds normal; no masses,  no organomegaly Extremities: extremities normal, atraumatic, no cyanosis or edema  ECOG PERFORMANCE STATUS: 1 - Symptomatic but completely  ambulatory  Blood pressure (!) 133/93, pulse 96, temperature (!) 97.3 F (36.3 C), temperature source Temporal, resp. rate 16, height 5' (1.524 m), weight 112 lb (50.8 kg), SpO2 99%.  LABORATORY DATA: Lab Results  Component Value Date   WBC 4.5 06/25/2023   HGB 13.3 06/25/2023   HCT 38.8 06/25/2023   MCV 91.9 06/25/2023   PLT 142 (L) 06/25/2023      Chemistry      Component Value Date/Time   NA 138 06/25/2023 1018   K 4.2 06/25/2023 1018   CL 102 06/25/2023 1018   CO2 29 06/25/2023 1018   BUN 21 06/25/2023 1018   BUN 20 03/12/2020 0000   CREATININE 0.77 06/25/2023 1018   CREATININE 0.67 08/30/2020 1512   GLU 179 03/12/2020 0000      Component Value Date/Time   CALCIUM 9.4 06/25/2023 1018   ALKPHOS 46 06/25/2023 1018   AST 19 06/25/2023 1018   ALT 20 06/25/2023 1018   BILITOT 0.5 06/25/2023 1018       RADIOGRAPHIC STUDIES: CT CHEST ABDOMEN PELVIS W CONTRAST  Result Date: 07/27/2023 CLINICAL DATA:  Neuroendocrine carcinoma with metastasis to liver. Nausea. Chronic constipation. * Tracking Code: BO * EXAM: CT CHEST, ABDOMEN, AND PELVIS WITH CONTRAST TECHNIQUE: Multidetector CT imaging of the chest, abdomen and pelvis was performed following the standard protocol during bolus administration of intravenous contrast. RADIATION DOSE REDUCTION: This exam was performed according to the departmental dose-optimization program which includes automated exposure control, adjustment of the mA and/or kV according to patient size and/or use of iterative reconstruction technique. CONTRAST:  OMNIPAQUE IOHEXOL 300 MG/ML  SOLN COMPARISON:  Dotatate PET 01/22/2023. Chest and abdominal CTs of 05/25/2022. FINDINGS: CT CHEST FINDINGS Cardiovascular: Aortic atherosclerosis. Ascending aortic dilatation at 4.2 cm on 60/7 is similar. Tortuous descending thoracic aorta. Moderate cardiomegaly, accentuated by a pectus excavatum deformity. Multivessel coronary artery atherosclerosis. No central  pulmonary embolism, on this non-dedicated study. Mediastinum/Nodes: No supraclavicular adenopathy. No mediastinal or hilar adenopathy. Lungs/Pleura: No pleural fluid. Posterolateral right-sided tracheal diverticulum. Right apical pleural thickening. Status post right upper lobectomy. Right middle lobe scarring and mild mucoid impaction are unchanged. Right lower lobe pleural-based soft tissue density including up to 8 mm on 73/15 is unchanged, favoring scarring. Right lower lobe vague ground-glass nodules x2 on the order of 3-4 mm including on 69/15 and 55/15 are unchanged. Posterior left upper lobe 5 mm nodule with possible minimal cavitation, similar on 45/15. Musculoskeletal: Lower thoracic spondylosis. S shaped thoracolumbar spine curvature. CT ABDOMEN PELVIS FINDINGS Hepatobiliary: Hypervascular hepatic metastasis are again identified. These are optimally compared to the prior CT using portal venous phase images today. Direct comparison to prior PET is challenging secondary to noncontrast CT technique on that exam. High right hepatic lobe lesion measures 1.6 cm on 85/7 versus 1.0 cm on 05/25/2022. Capsular based posterior right hepatic lobe lesion measures 2.5 x 1.7 cm on 94/7 today versus  2.3 x 1.8 cm on 05/25/2022 (when remeasured). Central right hepatic lobe subtle lesion measures 1.4 x 1.3 cm on 105/7 versus 1.3 x 1.3 cm on 05/25/2022 (when remeasured). Cholecystectomy with upper normal common duct. Pancreas: Pancreas divisum, with a dorsal duct entering the duodenum on 116/7. No duct dilatation or acute inflammation. Spleen: Normal in size, without focal abnormality. Adrenals/Urinary Tract: Normal adrenal glands. Bilateral low-density renal lesions including at up to 1.2 cm on the right are likely cysts and complex cysts and do not warrant specific imaging follow-up. No hydronephrosis. Normal urinary bladder. Stomach/Bowel: Normal stomach, without wall thickening. Normal colon. No small bowel mass  identified and no correlate for the Dotatate PET finding. Vascular/Lymphatic: Advanced aortic and branch vessel atherosclerosis. Small bowel mesenteric adenopathy again identified. Example mass at the level of the aortic bifurcation measures 2.4 x 2.2 cm on 138/7. Similar to the prior PET (when remeasured). No pelvic sidewall adenopathy. Reproductive: Hysterectomy.  No adnexal mass. Other: No significant free fluid.  Pelvic floor laxity. Musculoskeletal: Osteopenia. IMPRESSION: CT CHEST IMPRESSION 1.  No acute process or evidence of metastatic disease in the chest. 2. Status post right upper lobectomy. 3. Similar small pulmonary nodules and areas of presumed scarring. 4. Similar ascending aortic dilatation at 4.2 cm. 5. Coronary artery atherosclerosis. Aortic Atherosclerosis (ICD10-I70.0). CT ABDOMEN AND PELVIS IMPRESSION 1. Hepatic metastasis, most directly comparable to CT of 05/25/2022. Similar to minimally progressive since that exam. 2. Lower abdominal nodal metastasis, similar to the PET of 01/22/2023. 3. No small bowel mass to correspond to the tracer affinity on Dotatate PET. 4. Pancreas divisum Electronically Signed   By: Jeronimo Greaves M.D.   On: 07/27/2023 12:25     ASSESSMENT AND PLAN: This is a very pleasant 89 years old white female diagnosed with stage Ib (T2 a, N0, M0) non-small cell lung cancer, adenocarcinoma predominantly acinar with lipidic features diagnosed in August 2020 status post right upper lobectomy with lymph node dissection in [place]). Molecular studies showed negative for EGFR mutation and negative PD-L1 expression. The patient is currently on observation and she is feeling fine today with no concerning complaints. Her recent scan of the chest showed no concerning findings for disease recurrence or progression in the chest but there was suspicious liver lesion that was seen on previous PET scan when she was in [place] and they were not hypermetabolic but still  concerning.  I recommended for the patient to have MRI of the liver for evaluation of this lesion and to rule out metastatic disease. I ordered MRI of the liver that was performed recently and unfortunately showed concerning findings for liver metastasis. The patient underwent ultrasound-guided core biopsy of one of the liver lesion and the final pathology was consistent with well-differentiated neuroendocrine carcinoma of gastrointestinal primary. She was seen by second opinion by Dr. Lattie Corns at [facility] cancer center and he recommended for the patient to consider PET Dotatate and treatment with octreotide after the scan if she becomes symptomatic. The patient is currently on observation. She had repeat CT scan of the chest, abdomen and pelvis performed recently.  I personally and independently reviewed the scan images and discussed the result with the patient today. Her scan showed no concerning findings for disease recurrence or metastasis except for a slight increase in the liver lesions. I recommended for her to continue on observation with repeat CT scan of the chest, abdomen and pelvis in 6 months. She was advised to call immediately if  she has any other concerning symptoms in the interval.    The patient voices understanding of current disease status and treatment options and is in agreement with the current care plan.  All questions were answered. The patient knows to call the clinic with any problems, questions or concerns. We can certainly see the patient much sooner if necessary.  The total time spent in the appointment was 20 minutes.  Disclaimer: This note was dictated with voice recognition software. Similar sounding words can inadvertently be transcribed and may not be corrected upon review.

## 2023-08-01 ENCOUNTER — Other Ambulatory Visit: Payer: Medicare HMO

## 2023-08-01 ENCOUNTER — Ambulatory Visit: Payer: Medicare HMO | Admitting: Internal Medicine

## 2023-08-01 LAB — CHROMOGRANIN A: Chromogranin A (ng/mL): 158.4 ng/mL — ABNORMAL HIGH (ref 0.0–101.8)

## 2023-08-02 DIAGNOSIS — H3562 Retinal hemorrhage, left eye: Secondary | ICD-10-CM | POA: Diagnosis not present

## 2023-08-02 DIAGNOSIS — H401131 Primary open-angle glaucoma, bilateral, mild stage: Secondary | ICD-10-CM | POA: Diagnosis not present

## 2023-08-02 DIAGNOSIS — H34812 Central retinal vein occlusion, left eye, with macular edema: Secondary | ICD-10-CM | POA: Diagnosis not present

## 2023-08-02 DIAGNOSIS — H348121 Central retinal vein occlusion, left eye, with retinal neovascularization: Secondary | ICD-10-CM | POA: Diagnosis not present

## 2023-08-02 DIAGNOSIS — H35352 Cystoid macular degeneration, left eye: Secondary | ICD-10-CM | POA: Diagnosis not present

## 2023-08-02 DIAGNOSIS — H43812 Vitreous degeneration, left eye: Secondary | ICD-10-CM | POA: Diagnosis not present

## 2023-08-02 DIAGNOSIS — H34232 Retinal artery branch occlusion, left eye: Secondary | ICD-10-CM | POA: Diagnosis not present

## 2023-08-30 ENCOUNTER — Encounter: Payer: Self-pay | Admitting: Family

## 2023-08-30 ENCOUNTER — Ambulatory Visit (INDEPENDENT_AMBULATORY_CARE_PROVIDER_SITE_OTHER): Payer: Medicare HMO | Admitting: Family

## 2023-08-30 VITALS — BP 120/80 | HR 90 | Temp 97.7°F | Resp 16 | Ht 60.0 in | Wt 111.6 lb

## 2023-08-30 DIAGNOSIS — Z0000 Encounter for general adult medical examination without abnormal findings: Secondary | ICD-10-CM

## 2023-08-30 NOTE — Progress Notes (Signed)
Subjective:   Jacqueline Hunt is a 89 y.o. female who presents for Medicare Annual (Subsequent) preventive examination.  Visit Complete: In person  Patient Medicare AWV questionnaire was completed by the patient on 08/30/2023; I have confirmed that all information answered by patient is correct and no changes since this date.  Cardiac Risk Factors include: advanced age (>66men, >74 women);hypertension;smoking/ tobacco exposure     Objective:    Today's Vitals   08/30/23 1047 08/30/23 1118  BP: 120/80   Pulse: 90   Resp: 16   Temp: 97.7 F (36.5 C)   SpO2: 99%   Weight: 111 lb 9.6 oz (50.6 kg)   Height: 5' (1.524 m)   PainSc:  7    Body mass index is 21.8 kg/m.     08/30/2023   10:46 AM 06/13/2023    9:09 AM 08/21/2022    3:11 PM 05/12/2022    2:34 PM 11/15/2021   11:58 AM 08/17/2021   10:33 AM 12/02/2020   10:16 AM  Advanced Directives  Does Patient Have a Medical Advance Directive? No No Yes Yes Yes Yes Yes  Type of Surveyor, minerals;Living will;Out of facility DNR (pink MOST or yellow form) Healthcare Power of [place];Living will;Out of facility DNR (pink MOST or yellow form) Healthcare Power of [place];Living will Healthcare Power of [place];Living will Healthcare Power of [place];Out of facility DNR (pink MOST or yellow form)  Does patient want to make changes to medical advance directive?   No - Patient declined No - Patient declined No - Patient declined No - Patient declined No - Patient declined  Copy of Healthcare Power of Attorney in Chart?   No - copy requested No - copy requested No - copy requested No - copy requested Yes - validated most recent copy scanned in chart (See row information)  Would patient like information on creating a medical advance directive?  No - Patient declined         Current Medications (verified) Outpatient Encounter Medications as of 08/30/2023  Medication Sig   acetaminophen (TYLENOL) 650 MG CR tablet  Take 650 mg by mouth in the morning and at bedtime.   amLODipine (NORVASC) 5 MG tablet Take 5 mg by mouth daily.   aspirin EC 81 MG tablet Take 81 mg by mouth daily.   carvedilol (COREG) 3.125 MG tablet Take 1 tablet (3.125 mg total) by mouth 2 (two) times daily.   Cholecalciferol (VITAMIN D3 PO) Take 500 mg by mouth daily.   Doxylamine Succinate, Sleep, (UNISOM PO) Take by mouth.   estradiol (CLIMARA - DOSED IN MG/24 HR) 0.05 mg/24hr patch Place 1 patch (0.05 mg total) onto the skin once a week.   latanoprost (XALATAN) 0.005 % ophthalmic solution Place 1 drop into both eyes at bedtime.   lisinopril (ZESTRIL) 10 MG tablet TAKE 1 TABLET BY MOUTH TWICE A DAY   Multiple Vitamins-Minerals (DAILY MULTIVITAMIN PO) Take 1 tablet by mouth daily.   Naproxen Sodium (ALEVE) 220 MG CAPS Take 1 capsule by mouth daily as needed.   rosuvastatin (CRESTOR) 5 MG tablet TAKE 1 TABLET BY MOUTH EVERY DAY IN THE EVENING   vitamin B-12 (CYANOCOBALAMIN) 500 MCG tablet Take 500 mcg by mouth daily.   No facility-administered encounter medications on file as of 08/30/2023.    Allergies (verified) Patient has no known allergies.   History: Past Medical History:  Diagnosis Date   Arthritis    Brain tumor (HCC)    Constipation  due to opioid therapy    hospitalized d/t constipation   Dyspnea    High blood pressure    Lung cancer (HCC)    Osteopenia    Osteoporosis    Parathyroid adenoma    Past Surgical History:  Procedure Laterality Date   brain meningioma excesion     CATARACT EXTRACTION     CESAREAN SECTION     GALLBLADDER SURGERY     gallbladder surgery      LOBECTOMY     TONSILLECTOMY     TONSILLECTOMY     WRIST FRACTURE SURGERY     No family history on file. Social History   Socioeconomic History   Marital status: Divorced    Spouse name: Not on file   Number of children: Not on file   Years of education: Not on file   Highest education level: Doctorate  Occupational History   Not on  file  Tobacco Use   Smoking status: Former    Current packs/day: 0.00    Types: Cigarettes    Quit date: 1964    Years since quitting: 60.7   Smokeless tobacco: Never  Vaping Use   Vaping status: Never Used  Substance and Sexual Activity   Alcohol use: Never   Drug use: Never   Sexual activity: Not Currently  Other Topics Concern   Not on file  Social History Narrative   Diet: Nothing Special      Do you drink/ eat things with caffeine?  Coffe      Marital status:   Divorced                            What year were you married ? 1951      Do you live in a house, apartment,assistred living, condo, trailer, etc.)? Apartment      Is it one or more stories? First Floor      How many persons live in your home ? One      Do you have any pets in your home ?(please list) Cat      Highest Level of education completed: PhD      Current or past profession:  Former Public house manager + Professor , Social Work      Do you exercise?      Walk                        Type & how often  Just Power Walk Daily      ADVANCED DIRECTIVES (Please bring copies)      Do you have a living will? Yes      Do you have a DNR form?    Yes                   If not, do you want to discuss one?       Do you have signed POA?HPOA forms?  Yes               If so, please bring to your appointment      FUNCTIONAL STATUS- To be completed by Spouse / child / Staff       Do you have difficulty bathing or dressing yourself ? No      Do you have difficulty preparing food or eating ? No      Do you have difficulty managing your mediation ? No  Do you have difficulty managing your finances ? No      Do you have difficulty affording your medication ? No      Social Determinants of Health   Financial Resource Strain: Low Risk  (03/10/2023)   Overall Financial Resource Strain (CARDIA)    Difficulty of Paying Living Expenses: Not hard at all  Food Insecurity: No Food Insecurity (03/10/2023)   Hunger Vital Sign     Worried About Running Out of Food in the Last Year: Never true    Ran Out of Food in the Last Year: Never true  Transportation Needs: No Transportation Needs (03/10/2023)   PRAPARE - Administrator, Civil Service (Medical): No    Lack of Transportation (Non-Medical): No  Physical Activity: Sufficiently Active (03/10/2023)   Exercise Vital Sign    Days of Exercise per Week: 3 days    Minutes of Exercise per Session: 80 min  Stress: Stress Concern Present (03/10/2023)   [employer] of Occupational Health - Occupational Stress Questionnaire    Feeling of Stress : Rather much  Social Connections: Moderately Isolated (03/10/2023)   Social Connection and Isolation Panel [NHANES]    Frequency of Communication with Friends and Family: More than three times a week    Frequency of Social Gatherings with Friends and Family: Once a week    Attends Religious Services: Never    Database administrator or Organizations: Yes    Attends Engineer, structural: More than 4 times per year    Marital Status: Divorced    Tobacco Counseling Counseling given: Not Answered   Clinical Intake:  Pre-visit preparation completed: No  Pain : 0-10 Pain Score: 7  (but 3-4 after taking pain medication) Pain Type: Chronic pain Pain Location: Neck Pain Orientation: Posterior Pain Radiating Towards: left shoulder Pain Descriptors / Indicators: Aching Pain Onset: More than a month ago Pain Frequency: Constant Pain Relieving Factors: ASA and aleve Effect of Pain on Daily Activities: No  Pain Relieving Factors: ASA and aleve  BMI - recorded: 21.8 Nutritional Status: BMI of 19-24  Normal Nutritional Risks: None Diabetes: No  How often do you need to have someone help you when you read instructions, pamphlets, or other written materials from your doctor or pharmacy?: 1 - Never What is the last grade level you completed in school?: Phd  Interpreter Needed?: No      Activities of  Daily Living    08/30/2023   11:27 AM  In your present state of health, do you have any difficulty performing the following activities:  Hearing? 1  Vision? 0  Difficulty concentrating or making decisions? 0  Walking or climbing stairs? 1  Comment use a cane  Dressing or bathing? 0  Doing errands, shopping? 1  Comment uses Lift or Counsellor and eating ? N  Using the Toilet? N  In the past six months, have you accidently leaked urine? Y  Do you have problems with loss of bowel control? N  Managing your Medications? N  Managing your Finances? N  Housekeeping or managing your Housekeeping? Y  Comment Has a Programmer, applications    Patient Care Team: [name], MD as PCP - General (Internal Medicine) Parke Poisson, MD as PCP - Cardiology (Cardiology) Edilia Bo (Podiatry)  Indicate any recent Medical Services you may have received from other than [facility] providers in the past year (date may be approximate).     Assessment:   This  is a routine wellness examination for Lumina.  Hearing/Vision screen No results found.   Goals Addressed             This Visit's Progress    Patient Stated   Not on track    I would like to do exercises daily to keep her gait steady        Depression Screen    08/30/2023   10:46 AM 12/04/2022    2:58 PM 08/17/2021   10:27 AM 12/02/2020   10:15 AM 08/30/2020    1:29 PM  PHQ 2/9 Scores  PHQ - 2 Score 0 0 0 0 0    Fall Risk    08/30/2023   10:46 AM 06/13/2023    9:09 AM 03/26/2023    3:12 PM 03/14/2023    1:39 PM 12/20/2022   11:24 AM  Fall Risk   Falls in the past year? 0 0 0 0 0  Number falls in past yr:  0 0 0 0  Injury with Fall?  0 0 0 0  Risk for fall due to :  No Fall Risks No Fall Risks No Fall Risks No Fall Risks  Follow up  Falls evaluation completed Falls evaluation completed Falls evaluation completed Falls evaluation completed    MEDICARE RISK AT HOME: Medicare Risk at Home Any stairs in or around  the home?: No If so, are there any without handrails?: No Home free of loose throw rugs in walkways, pet beds, electrical cords, etc?: No Adequate lighting in your home to reduce risk of falls?: No Life alert?: No Use of a cane, walker or w/c?: Yes Grab bars in the bathroom?: No Shower chair or bench in shower?: Yes Elevated toilet seat or a handicapped toilet?: No  TIMED UP AND GO:  Was the test performed?  Yes  Length of time to ambulate 10 feet: 15 sec Gait slow and steady with assistive device    Cognitive Function:    08/30/2023   10:49 AM  MMSE - Mini Mental State Exam  Orientation to time 5  Orientation to Place 5  Registration 3  Attention/ Calculation 5  Recall 2  Language- name 2 objects 2  Language- repeat 1  Language- follow 3 step command 3  Language- read & follow direction 1  Write a sentence 1  Copy design 1  Total score 29        08/21/2022    3:16 PM 08/17/2021   10:34 AM  6CIT Screen  What Year? 0 points 0 points  What month? 0 points 0 points  What time? 0 points 0 points  Count back from 20 0 points 0 points  Months in reverse 0 points 0 points  Repeat phrase 0 points 0 points  Total Score 0 points 0 points    Immunizations Immunization History  Administered Date(s) Administered   Hepatitis A, Adult 01/09/2018, 07/09/2018   Influenza Split 09/07/2011, 10/29/2012, 08/26/2014   Influenza, High Dose Seasonal PF 08/26/2014, 09/24/2017, 08/14/2021   Influenza-Unspecified 09/21/2009, 10/28/2019, 08/31/2020, 08/17/2022   Moderna Covid-19 Vaccine Bivalent Booster 71yrs & up 03/31/2021   Moderna Sars-Covid-2 Vaccination 04/01/2022   PFIZER Comirnaty(Gray Top)Covid-19 Tri-Sucrose Vaccine 03/08/2021, 08/14/2021   PFIZER(Purple Top)SARS-COV-2 Vaccination 12/16/2019, 01/05/2020, 08/17/2020, 03/08/2021   Pfizer Covid-19 Vaccine Bivalent Booster 5y-11y 08/17/2022   Pneumococcal Conjugate-13 02/25/2014   Pneumococcal Polysaccharide-23 01/15/2007   Rsv,  Bivalent, Protein Subunit Rsvpref,pf Verdis Frederickson) 08/31/2022   Td,absorbed, Preservative Free, Adult Use, Lf Unspecified 11/29/2017   Tdap 11/29/2017  Zoster Recombinant(Shingrix) 03/26/2017, 06/24/2017   Zoster, Live 11/06/2011    TDAP status: Up to date  Flu Vaccine status: Up to date  Pneumococcal vaccine status: Up to date  Covid-19 vaccine status: Information provided on how to obtain vaccines.   Qualifies for Shingles Vaccine? Yes   Zostavax completed No   Shingrix Completed?: Yes  Screening Tests Health Maintenance  Topic Date Due   COVID-19 Vaccine (10 - 2023-24 season) 07/29/2023   Medicare Annual Wellness (AWV)  08/29/2024   DTaP/Tdap/Td (3 - Td or Tdap) 11/30/2027   Pneumonia Vaccine 24+ Years old  Completed   INFLUENZA VACCINE  Completed   DEXA SCAN  Completed   Zoster Vaccines- Shingrix  Completed   HPV VACCINES  Aged Out    Health Maintenance  Health Maintenance Due  Topic Date Due   COVID-19 Vaccine (10 - 2023-24 season) 07/29/2023    Colorectal cancer screening: No longer required.   Mammogram status: No longer required due to advance age .  Bone Density status: Completed 05/06/2021. Results reflect: Bone density results: OSTEOPOROSIS. Repeat every 5 years.  Lung Cancer Screening: (Low Dose CT Chest recommended if Age 75-80 years, 20 pack-year currently smoking OR have quit w/in 15years.) does not qualify.   Lung Cancer Screening Referral: No   Additional Screening:  Hepatitis C Screening: does not qualify; Completed No   Vision Screening: Recommended annual ophthalmology exams for early detection of glaucoma and other disorders of the eye. Is the patient up to date with their annual eye exam?  No  Who is the provider or what is the name of the office in which the patient attends annual eye exams? Dr.Bowen  If pt is not established with a provider, would they like to be referred to a provider to establish care? No .   Dental Screening:  Recommended annual dental exams for proper oral hygiene  Diabetic Foot Exam: Diabetic Foot Exam: Completed N/A   Community Resource Referral / Chronic Care Management: CRR required this visit?  No   CCM required this visit?  No     Plan:     I have personally reviewed and noted the following in the patient's chart:   Medical and social history Use of alcohol, tobacco or illicit drugs  Current medications and supplements including opioid prescriptions. Patient is not currently taking opioid prescriptions. Functional ability and status Nutritional status Physical activity Advanced directives List of other physicians Hospitalizations, surgeries, and ER visits in previous 12 months Vitals Screenings to include cognitive, depression, and falls Referrals and appointments  In addition, I have reviewed and discussed with patient certain preventive protocols, quality metrics, and best practice recommendations. A written personalized care plan for preventive services as well as general preventive health recommendations were provided to patient.     Caesar Bookman, NP   08/30/2023   After Visit Summary: (In Person-Printed) AVS printed and given to the patient  Nurse Notes: Up to date

## 2023-08-30 NOTE — Patient Instructions (Signed)
Ms. Hertenstein , Thank you for taking time to come for your Medicare Wellness Visit. I appreciate your ongoing commitment to your health goals. Please review the following plan we discussed and let me know if I can assist you in the future.   Screening recommendations/referrals: Colonoscopy : N/A  Mammogram N/A Bone Density : Up to date  Recommended yearly ophthalmology/optometry visit for glaucoma screening and checkup Recommended yearly dental visit for hygiene and checkup  Vaccinations: Influenza vaccine- due annually in September/October Pneumococcal vaccine : Up to date  Tdap vaccine : Up to date  Shingles vaccine : Up to date     Advanced directives: No   Conditions/risks identified:  advanced age (>28men, >48 women);hypertension;smoking/ tobacco exposure  Next appointment: 1 year    Preventive Care 40 Years and Older, Female Preventive care refers to lifestyle choices and visits with your health care provider that can promote health and wellness. What does preventive care include? A yearly physical exam. This is also called an annual well check. Dental exams once or twice a year. Routine eye exams. Ask your health care provider how often you should have your eyes checked. Personal lifestyle choices, including: Daily care of your teeth and gums. Regular physical activity. Eating a healthy diet. Avoiding tobacco and drug use. Limiting alcohol use. Practicing safe sex. Taking low-dose aspirin every day. Taking vitamin and mineral supplements as recommended by your health care provider. What happens during an annual well check? The services and screenings done by your health care provider during your annual well check will depend on your age, overall health, lifestyle risk factors, and family history of disease. Counseling  Your health care provider may ask you questions about your: Alcohol use. Tobacco use. Drug use. Emotional well-being. Home and relationship  well-being. Sexual activity. Eating habits. History of falls. Memory and ability to understand (cognition). Work and work Astronomer. Reproductive health. Screening  You may have the following tests or measurements: Height, weight, and BMI. Blood pressure. Lipid and cholesterol levels. These may be checked every 5 years, or more frequently if you are over 57 years old. Skin check. Lung cancer screening. You may have this screening every year starting at age 52 if you have a 30-pack-year history of smoking and currently smoke or have quit within the past 15 years. Fecal occult blood test (FOBT) of the stool. You may have this test every year starting at age 60. Flexible sigmoidoscopy or colonoscopy. You may have a sigmoidoscopy every 5 years or a colonoscopy every 10 years starting at age 47. Hepatitis C blood test. Hepatitis B blood test. Sexually transmitted disease (STD) testing. Diabetes screening. This is done by checking your blood sugar (glucose) after you have not eaten for a while (fasting). You may have this done every 1-3 years. Bone density scan. This is done to screen for osteoporosis. You may have this done starting at age 39. Mammogram. This may be done every 1-2 years. Talk to your health care provider about how often you should have regular mammograms. Talk with your health care provider about your test results, treatment options, and if necessary, the need for more tests. Vaccines  Your health care provider may recommend certain vaccines, such as: Influenza vaccine. This is recommended every year. Tetanus, diphtheria, and acellular pertussis (Tdap, Td) vaccine. You may need a Td booster every 10 years. Zoster vaccine. You may need this after age 57. Pneumococcal 13-valent conjugate (PCV13) vaccine. One dose is recommended after age 50. Pneumococcal polysaccharide (PPSV23)  vaccine. One dose is recommended after age 25. Talk to your health care provider about which  screenings and vaccines you need and how often you need them. This information is not intended to replace advice given to you by your health care provider. Make sure you discuss any questions you have with your health care provider. Document Released: 12/10/2015 Document Revised: 08/02/2016 Document Reviewed: 09/14/2015 Elsevier Interactive Patient Education  2017 [employer].  Fall Prevention in the Home Falls can cause injuries. They can happen to people of all ages. There are many things you can do to make your home safe and to help prevent falls. What can I do on the outside of my home? Regularly fix the edges of walkways and driveways and fix any cracks. Remove anything that might make you trip as you walk through a door, such as a raised step or threshold. Trim any bushes or trees on the path to your home. Use bright outdoor lighting. Clear any walking paths of anything that might make someone trip, such as rocks or tools. Regularly check to see if handrails are loose or broken. Make sure that both sides of any steps have handrails. Any raised decks and porches should have guardrails on the edges. Have any leaves, snow, or ice cleared regularly. Use sand or salt on walking paths during winter. Clean up any spills in your garage right away. This includes oil or grease spills. What can I do in the bathroom? Use night lights. Install grab bars by the toilet and in the tub and shower. Do not use towel bars as grab bars. Use non-skid mats or decals in the tub or shower. If you need to sit down in the shower, use a plastic, non-slip stool. Keep the floor dry. Clean up any water that spills on the floor as soon as it happens. Remove soap buildup in the tub or shower regularly. Attach bath mats securely with double-sided non-slip rug tape. Do not have throw rugs and other things on the floor that can make you trip. What can I do in the bedroom? Use night lights. Make sure that you have a  light by your bed that is easy to reach. Do not use any sheets or blankets that are too big for your bed. They should not hang down onto the floor. Have a firm chair that has side arms. You can use this for support while you get dressed. Do not have throw rugs and other things on the floor that can make you trip. What can I do in the kitchen? Clean up any spills right away. Avoid walking on wet floors. Keep items that you use a lot in easy-to-reach places. If you need to reach something above you, use a strong step stool that has a grab bar. Keep electrical cords out of the way. Do not use floor polish or wax that makes floors slippery. If you must use wax, use non-skid floor wax. Do not have throw rugs and other things on the floor that can make you trip. What can I do with my stairs? Do not leave any items on the stairs. Make sure that there are handrails on both sides of the stairs and use them. Fix handrails that are broken or loose. Make sure that handrails are as long as the stairways. Check any carpeting to make sure that it is firmly attached to the stairs. Fix any carpet that is loose or worn. Avoid having throw rugs at the top or bottom  of the stairs. If you do have throw rugs, attach them to the floor with carpet tape. Make sure that you have a light switch at the top of the stairs and the bottom of the stairs. If you do not have them, ask someone to add them for you. What else can I do to help prevent falls? Wear shoes that: Do not have high heels. Have rubber bottoms. Are comfortable and fit you well. Are closed at the toe. Do not wear sandals. If you use a stepladder: Make sure that it is fully opened. Do not climb a closed stepladder. Make sure that both sides of the stepladder are locked into place. Ask someone to hold it for you, if possible. Clearly mark and make sure that you can see: Any grab bars or handrails. First and last steps. Where the edge of each step  is. Use tools that help you move around (mobility aids) if they are needed. These include: Canes. Walkers. Scooters. Crutches. Turn on the lights when you go into a dark area. Replace any light bulbs as soon as they burn out. Set up your furniture so you have a clear path. Avoid moving your furniture around. If any of your floors are uneven, fix them. If there are any pets around you, be aware of where they are. Review your medicines with your doctor. Some medicines can make you feel dizzy. This can increase your chance of falling. Ask your doctor what other things that you can do to help prevent falls. This information is not intended to replace advice given to you by your health care provider. Make sure you discuss any questions you have with your health care provider. Document Released: 09/09/2009 Document Revised: 04/20/2016 Document Reviewed: 12/18/2014 Elsevier Interactive Patient Education  2017 [employer].

## 2023-10-21 ENCOUNTER — Other Ambulatory Visit: Payer: Self-pay | Admitting: Family

## 2023-10-25 DIAGNOSIS — I440 Atrioventricular block, first degree: Secondary | ICD-10-CM | POA: Diagnosis not present

## 2023-10-25 DIAGNOSIS — Z85118 Personal history of other malignant neoplasm of bronchus and lung: Secondary | ICD-10-CM | POA: Diagnosis not present

## 2023-10-25 DIAGNOSIS — Z9181 History of falling: Secondary | ICD-10-CM | POA: Diagnosis not present

## 2023-10-25 DIAGNOSIS — S79911A Unspecified injury of right hip, initial encounter: Secondary | ICD-10-CM | POA: Diagnosis not present

## 2023-10-25 DIAGNOSIS — E785 Hyperlipidemia, unspecified: Secondary | ICD-10-CM | POA: Diagnosis not present

## 2023-10-25 DIAGNOSIS — Z5982 Transportation insecurity: Secondary | ICD-10-CM | POA: Diagnosis not present

## 2023-10-25 DIAGNOSIS — Y92018 Other place in single-family (private) house as the place of occurrence of the external cause: Secondary | ICD-10-CM | POA: Diagnosis not present

## 2023-10-25 DIAGNOSIS — Z902 Acquired absence of lung [part of]: Secondary | ICD-10-CM | POA: Diagnosis not present

## 2023-10-25 DIAGNOSIS — C7B00 Secondary carcinoid tumors, unspecified site: Secondary | ICD-10-CM | POA: Diagnosis not present

## 2023-10-25 DIAGNOSIS — G4733 Obstructive sleep apnea (adult) (pediatric): Secondary | ICD-10-CM | POA: Diagnosis not present

## 2023-10-25 DIAGNOSIS — Z87891 Personal history of nicotine dependence: Secondary | ICD-10-CM | POA: Diagnosis not present

## 2023-10-25 DIAGNOSIS — Y92000 Kitchen of unspecified non-institutional (private) residence as  the place of occurrence of the external cause: Secondary | ICD-10-CM | POA: Diagnosis not present

## 2023-10-25 DIAGNOSIS — D62 Acute posthemorrhagic anemia: Secondary | ICD-10-CM | POA: Diagnosis not present

## 2023-10-25 DIAGNOSIS — Z9049 Acquired absence of other specified parts of digestive tract: Secondary | ICD-10-CM | POA: Diagnosis not present

## 2023-10-25 DIAGNOSIS — Z86011 Personal history of benign neoplasm of the brain: Secondary | ICD-10-CM | POA: Diagnosis not present

## 2023-10-25 DIAGNOSIS — Y929 Unspecified place or not applicable: Secondary | ICD-10-CM | POA: Diagnosis not present

## 2023-10-25 DIAGNOSIS — S72001S Fracture of unspecified part of neck of right femur, sequela: Secondary | ICD-10-CM | POA: Diagnosis not present

## 2023-10-25 DIAGNOSIS — Z967 Presence of other bone and tendon implants: Secondary | ICD-10-CM | POA: Diagnosis not present

## 2023-10-25 DIAGNOSIS — N959 Unspecified menopausal and perimenopausal disorder: Secondary | ICD-10-CM | POA: Diagnosis not present

## 2023-10-25 DIAGNOSIS — G4700 Insomnia, unspecified: Secondary | ICD-10-CM | POA: Diagnosis not present

## 2023-10-25 DIAGNOSIS — C3411 Malignant neoplasm of upper lobe, right bronchus or lung: Secondary | ICD-10-CM | POA: Diagnosis not present

## 2023-10-25 DIAGNOSIS — Z8673 Personal history of transient ischemic attack (TIA), and cerebral infarction without residual deficits: Secondary | ICD-10-CM | POA: Diagnosis not present

## 2023-10-25 DIAGNOSIS — C3491 Malignant neoplasm of unspecified part of right bronchus or lung: Secondary | ICD-10-CM | POA: Diagnosis not present

## 2023-10-25 DIAGNOSIS — R000 Tachycardia, unspecified: Secondary | ICD-10-CM | POA: Diagnosis not present

## 2023-10-25 DIAGNOSIS — I2510 Atherosclerotic heart disease of native coronary artery without angina pectoris: Secondary | ICD-10-CM | POA: Diagnosis not present

## 2023-10-25 DIAGNOSIS — W1839XA Other fall on same level, initial encounter: Secondary | ICD-10-CM | POA: Diagnosis not present

## 2023-10-25 DIAGNOSIS — Z7982 Long term (current) use of aspirin: Secondary | ICD-10-CM | POA: Diagnosis not present

## 2023-10-25 DIAGNOSIS — Z9889 Other specified postprocedural states: Secondary | ICD-10-CM | POA: Diagnosis not present

## 2023-10-25 DIAGNOSIS — K5900 Constipation, unspecified: Secondary | ICD-10-CM | POA: Diagnosis not present

## 2023-10-25 DIAGNOSIS — I498 Other specified cardiac arrhythmias: Secondary | ICD-10-CM | POA: Diagnosis not present

## 2023-10-25 DIAGNOSIS — Y9389 Activity, other specified: Secondary | ICD-10-CM | POA: Diagnosis not present

## 2023-10-25 DIAGNOSIS — R0902 Hypoxemia: Secondary | ICD-10-CM | POA: Diagnosis not present

## 2023-10-25 DIAGNOSIS — C3490 Malignant neoplasm of unspecified part of unspecified bronchus or lung: Secondary | ICD-10-CM | POA: Diagnosis not present

## 2023-10-25 DIAGNOSIS — I10 Essential (primary) hypertension: Secondary | ICD-10-CM | POA: Diagnosis not present

## 2023-10-25 DIAGNOSIS — Z9079 Acquired absence of other genital organ(s): Secondary | ICD-10-CM | POA: Diagnosis not present

## 2023-10-25 DIAGNOSIS — G2581 Restless legs syndrome: Secondary | ICD-10-CM | POA: Diagnosis not present

## 2023-10-25 DIAGNOSIS — Z79899 Other long term (current) drug therapy: Secondary | ICD-10-CM | POA: Diagnosis not present

## 2023-10-25 DIAGNOSIS — S72001A Fracture of unspecified part of neck of right femur, initial encounter for closed fracture: Secondary | ICD-10-CM | POA: Diagnosis not present

## 2023-10-25 DIAGNOSIS — Z90722 Acquired absence of ovaries, bilateral: Secondary | ICD-10-CM | POA: Diagnosis not present

## 2023-10-25 DIAGNOSIS — Z9981 Dependence on supplemental oxygen: Secondary | ICD-10-CM | POA: Diagnosis not present

## 2023-10-25 DIAGNOSIS — Z79891 Long term (current) use of opiate analgesic: Secondary | ICD-10-CM | POA: Diagnosis not present

## 2023-10-25 DIAGNOSIS — R197 Diarrhea, unspecified: Secondary | ICD-10-CM | POA: Diagnosis not present

## 2023-10-25 DIAGNOSIS — W541XXA Struck by dog, initial encounter: Secondary | ICD-10-CM | POA: Diagnosis not present

## 2023-10-25 DIAGNOSIS — W01190D Fall on same level from slipping, tripping and stumbling with subsequent striking against furniture, subsequent encounter: Secondary | ICD-10-CM | POA: Diagnosis not present

## 2023-10-25 DIAGNOSIS — E210 Primary hyperparathyroidism: Secondary | ICD-10-CM | POA: Diagnosis not present

## 2023-10-25 DIAGNOSIS — S72001D Fracture of unspecified part of neck of right femur, subsequent encounter for closed fracture with routine healing: Secondary | ICD-10-CM | POA: Diagnosis not present

## 2023-10-25 DIAGNOSIS — C787 Secondary malignant neoplasm of liver and intrahepatic bile duct: Secondary | ICD-10-CM | POA: Diagnosis not present

## 2023-10-25 DIAGNOSIS — R0602 Shortness of breath: Secondary | ICD-10-CM | POA: Diagnosis not present

## 2023-10-25 DIAGNOSIS — Z90710 Acquired absence of both cervix and uterus: Secondary | ICD-10-CM | POA: Diagnosis not present

## 2023-10-25 DIAGNOSIS — S79919A Unspecified injury of unspecified hip, initial encounter: Secondary | ICD-10-CM | POA: Diagnosis not present

## 2023-10-30 DIAGNOSIS — Z85118 Personal history of other malignant neoplasm of bronchus and lung: Secondary | ICD-10-CM | POA: Diagnosis not present

## 2023-10-30 DIAGNOSIS — H47012 Ischemic optic neuropathy, left eye: Secondary | ICD-10-CM | POA: Diagnosis not present

## 2023-10-30 DIAGNOSIS — Z79891 Long term (current) use of opiate analgesic: Secondary | ICD-10-CM | POA: Diagnosis not present

## 2023-10-30 DIAGNOSIS — R1915 Other abnormal bowel sounds: Secondary | ICD-10-CM | POA: Diagnosis not present

## 2023-10-30 DIAGNOSIS — Z90710 Acquired absence of both cervix and uterus: Secondary | ICD-10-CM | POA: Diagnosis not present

## 2023-10-30 DIAGNOSIS — E210 Primary hyperparathyroidism: Secondary | ICD-10-CM | POA: Diagnosis not present

## 2023-10-30 DIAGNOSIS — W01190D Fall on same level from slipping, tripping and stumbling with subsequent striking against furniture, subsequent encounter: Secondary | ICD-10-CM | POA: Diagnosis not present

## 2023-10-30 DIAGNOSIS — R0902 Hypoxemia: Secondary | ICD-10-CM | POA: Diagnosis not present

## 2023-10-30 DIAGNOSIS — K5900 Constipation, unspecified: Secondary | ICD-10-CM | POA: Diagnosis not present

## 2023-10-30 DIAGNOSIS — Z8673 Personal history of transient ischemic attack (TIA), and cerebral infarction without residual deficits: Secondary | ICD-10-CM | POA: Diagnosis not present

## 2023-10-30 DIAGNOSIS — G4733 Obstructive sleep apnea (adult) (pediatric): Secondary | ICD-10-CM | POA: Diagnosis not present

## 2023-10-30 DIAGNOSIS — Z87891 Personal history of nicotine dependence: Secondary | ICD-10-CM | POA: Diagnosis not present

## 2023-10-30 DIAGNOSIS — I10 Essential (primary) hypertension: Secondary | ICD-10-CM | POA: Diagnosis not present

## 2023-10-30 DIAGNOSIS — R140 Abdominal distension (gaseous): Secondary | ICD-10-CM | POA: Diagnosis not present

## 2023-10-30 DIAGNOSIS — Z7982 Long term (current) use of aspirin: Secondary | ICD-10-CM | POA: Diagnosis not present

## 2023-10-30 DIAGNOSIS — S72001D Fracture of unspecified part of neck of right femur, subsequent encounter for closed fracture with routine healing: Secondary | ICD-10-CM | POA: Diagnosis not present

## 2023-10-30 DIAGNOSIS — S72001A Fracture of unspecified part of neck of right femur, initial encounter for closed fracture: Secondary | ICD-10-CM | POA: Diagnosis not present

## 2023-10-30 DIAGNOSIS — N959 Unspecified menopausal and perimenopausal disorder: Secondary | ICD-10-CM | POA: Diagnosis not present

## 2023-10-30 DIAGNOSIS — G4700 Insomnia, unspecified: Secondary | ICD-10-CM | POA: Diagnosis not present

## 2023-10-30 DIAGNOSIS — Z7189 Other specified counseling: Secondary | ICD-10-CM | POA: Diagnosis not present

## 2023-10-30 DIAGNOSIS — C3491 Malignant neoplasm of unspecified part of right bronchus or lung: Secondary | ICD-10-CM | POA: Diagnosis not present

## 2023-10-30 DIAGNOSIS — R110 Nausea: Secondary | ICD-10-CM | POA: Diagnosis not present

## 2023-10-30 DIAGNOSIS — C787 Secondary malignant neoplasm of liver and intrahepatic bile duct: Secondary | ICD-10-CM | POA: Diagnosis not present

## 2023-10-30 DIAGNOSIS — R000 Tachycardia, unspecified: Secondary | ICD-10-CM | POA: Diagnosis not present

## 2023-10-30 DIAGNOSIS — Z967 Presence of other bone and tendon implants: Secondary | ICD-10-CM | POA: Diagnosis not present

## 2023-10-30 DIAGNOSIS — E785 Hyperlipidemia, unspecified: Secondary | ICD-10-CM | POA: Diagnosis not present

## 2023-10-30 DIAGNOSIS — Z902 Acquired absence of lung [part of]: Secondary | ICD-10-CM | POA: Diagnosis not present

## 2023-10-30 DIAGNOSIS — R194 Change in bowel habit: Secondary | ICD-10-CM | POA: Diagnosis not present

## 2023-10-30 DIAGNOSIS — R634 Abnormal weight loss: Secondary | ICD-10-CM | POA: Diagnosis not present

## 2023-10-30 DIAGNOSIS — Z9049 Acquired absence of other specified parts of digestive tract: Secondary | ICD-10-CM | POA: Diagnosis not present

## 2023-10-30 DIAGNOSIS — H34812 Central retinal vein occlusion, left eye, with macular edema: Secondary | ICD-10-CM | POA: Diagnosis not present

## 2023-10-30 DIAGNOSIS — G2581 Restless legs syndrome: Secondary | ICD-10-CM | POA: Diagnosis not present

## 2023-10-30 DIAGNOSIS — Z86011 Personal history of benign neoplasm of the brain: Secondary | ICD-10-CM | POA: Diagnosis not present

## 2023-10-30 DIAGNOSIS — Z961 Presence of intraocular lens: Secondary | ICD-10-CM | POA: Diagnosis not present

## 2023-10-30 DIAGNOSIS — C7B00 Secondary carcinoid tumors, unspecified site: Secondary | ICD-10-CM | POA: Diagnosis not present

## 2023-10-30 DIAGNOSIS — D62 Acute posthemorrhagic anemia: Secondary | ICD-10-CM | POA: Diagnosis not present

## 2023-10-30 DIAGNOSIS — C3490 Malignant neoplasm of unspecified part of unspecified bronchus or lung: Secondary | ICD-10-CM | POA: Diagnosis not present

## 2023-10-30 DIAGNOSIS — R197 Diarrhea, unspecified: Secondary | ICD-10-CM | POA: Diagnosis not present

## 2023-10-31 DIAGNOSIS — E785 Hyperlipidemia, unspecified: Secondary | ICD-10-CM | POA: Diagnosis not present

## 2023-10-31 DIAGNOSIS — N959 Unspecified menopausal and perimenopausal disorder: Secondary | ICD-10-CM | POA: Diagnosis not present

## 2023-10-31 DIAGNOSIS — C7B00 Secondary carcinoid tumors, unspecified site: Secondary | ICD-10-CM | POA: Diagnosis not present

## 2023-10-31 DIAGNOSIS — D62 Acute posthemorrhagic anemia: Secondary | ICD-10-CM | POA: Diagnosis not present

## 2023-10-31 DIAGNOSIS — R000 Tachycardia, unspecified: Secondary | ICD-10-CM | POA: Diagnosis not present

## 2023-10-31 DIAGNOSIS — G4700 Insomnia, unspecified: Secondary | ICD-10-CM | POA: Diagnosis not present

## 2023-10-31 DIAGNOSIS — R197 Diarrhea, unspecified: Secondary | ICD-10-CM | POA: Diagnosis not present

## 2023-10-31 DIAGNOSIS — S72001D Fracture of unspecified part of neck of right femur, subsequent encounter for closed fracture with routine healing: Secondary | ICD-10-CM | POA: Diagnosis not present

## 2023-10-31 DIAGNOSIS — I10 Essential (primary) hypertension: Secondary | ICD-10-CM | POA: Diagnosis not present

## 2023-10-31 DIAGNOSIS — Z7189 Other specified counseling: Secondary | ICD-10-CM | POA: Diagnosis not present

## 2023-10-31 DIAGNOSIS — C3490 Malignant neoplasm of unspecified part of unspecified bronchus or lung: Secondary | ICD-10-CM | POA: Diagnosis not present

## 2023-11-02 DIAGNOSIS — G4700 Insomnia, unspecified: Secondary | ICD-10-CM | POA: Diagnosis not present

## 2023-11-02 DIAGNOSIS — S72001D Fracture of unspecified part of neck of right femur, subsequent encounter for closed fracture with routine healing: Secondary | ICD-10-CM | POA: Diagnosis not present

## 2023-11-02 DIAGNOSIS — I10 Essential (primary) hypertension: Secondary | ICD-10-CM | POA: Diagnosis not present

## 2023-11-02 DIAGNOSIS — K5900 Constipation, unspecified: Secondary | ICD-10-CM | POA: Diagnosis not present

## 2023-11-02 DIAGNOSIS — Z7189 Other specified counseling: Secondary | ICD-10-CM | POA: Diagnosis not present

## 2023-11-02 DIAGNOSIS — C7B00 Secondary carcinoid tumors, unspecified site: Secondary | ICD-10-CM | POA: Diagnosis not present

## 2023-11-02 DIAGNOSIS — C3490 Malignant neoplasm of unspecified part of unspecified bronchus or lung: Secondary | ICD-10-CM | POA: Diagnosis not present

## 2023-11-02 DIAGNOSIS — R000 Tachycardia, unspecified: Secondary | ICD-10-CM | POA: Diagnosis not present

## 2023-11-02 DIAGNOSIS — N959 Unspecified menopausal and perimenopausal disorder: Secondary | ICD-10-CM | POA: Diagnosis not present

## 2023-11-02 DIAGNOSIS — E785 Hyperlipidemia, unspecified: Secondary | ICD-10-CM | POA: Diagnosis not present

## 2023-11-02 DIAGNOSIS — D62 Acute posthemorrhagic anemia: Secondary | ICD-10-CM | POA: Diagnosis not present

## 2023-11-03 DIAGNOSIS — R140 Abdominal distension (gaseous): Secondary | ICD-10-CM | POA: Diagnosis not present

## 2023-11-04 DIAGNOSIS — E785 Hyperlipidemia, unspecified: Secondary | ICD-10-CM | POA: Diagnosis not present

## 2023-11-04 DIAGNOSIS — K5900 Constipation, unspecified: Secondary | ICD-10-CM | POA: Diagnosis not present

## 2023-11-04 DIAGNOSIS — D62 Acute posthemorrhagic anemia: Secondary | ICD-10-CM | POA: Diagnosis not present

## 2023-11-04 DIAGNOSIS — N959 Unspecified menopausal and perimenopausal disorder: Secondary | ICD-10-CM | POA: Diagnosis not present

## 2023-11-04 DIAGNOSIS — Z7189 Other specified counseling: Secondary | ICD-10-CM | POA: Diagnosis not present

## 2023-11-04 DIAGNOSIS — S72001D Fracture of unspecified part of neck of right femur, subsequent encounter for closed fracture with routine healing: Secondary | ICD-10-CM | POA: Diagnosis not present

## 2023-11-04 DIAGNOSIS — I10 Essential (primary) hypertension: Secondary | ICD-10-CM | POA: Diagnosis not present

## 2023-11-04 DIAGNOSIS — R000 Tachycardia, unspecified: Secondary | ICD-10-CM | POA: Diagnosis not present

## 2023-11-04 DIAGNOSIS — C7B00 Secondary carcinoid tumors, unspecified site: Secondary | ICD-10-CM | POA: Diagnosis not present

## 2023-11-04 DIAGNOSIS — G4700 Insomnia, unspecified: Secondary | ICD-10-CM | POA: Diagnosis not present

## 2023-11-04 DIAGNOSIS — C3490 Malignant neoplasm of unspecified part of unspecified bronchus or lung: Secondary | ICD-10-CM | POA: Diagnosis not present

## 2023-11-05 DIAGNOSIS — C7B00 Secondary carcinoid tumors, unspecified site: Secondary | ICD-10-CM | POA: Diagnosis not present

## 2023-11-05 DIAGNOSIS — R000 Tachycardia, unspecified: Secondary | ICD-10-CM | POA: Diagnosis not present

## 2023-11-05 DIAGNOSIS — S72001D Fracture of unspecified part of neck of right femur, subsequent encounter for closed fracture with routine healing: Secondary | ICD-10-CM | POA: Diagnosis not present

## 2023-11-05 DIAGNOSIS — I10 Essential (primary) hypertension: Secondary | ICD-10-CM | POA: Diagnosis not present

## 2023-11-05 DIAGNOSIS — Z7189 Other specified counseling: Secondary | ICD-10-CM | POA: Diagnosis not present

## 2023-11-05 DIAGNOSIS — K5900 Constipation, unspecified: Secondary | ICD-10-CM | POA: Diagnosis not present

## 2023-11-05 DIAGNOSIS — G4700 Insomnia, unspecified: Secondary | ICD-10-CM | POA: Diagnosis not present

## 2023-11-05 DIAGNOSIS — E785 Hyperlipidemia, unspecified: Secondary | ICD-10-CM | POA: Diagnosis not present

## 2023-11-05 DIAGNOSIS — N959 Unspecified menopausal and perimenopausal disorder: Secondary | ICD-10-CM | POA: Diagnosis not present

## 2023-11-05 DIAGNOSIS — C3490 Malignant neoplasm of unspecified part of unspecified bronchus or lung: Secondary | ICD-10-CM | POA: Diagnosis not present

## 2023-11-05 DIAGNOSIS — D62 Acute posthemorrhagic anemia: Secondary | ICD-10-CM | POA: Diagnosis not present

## 2023-11-06 DIAGNOSIS — C7B00 Secondary carcinoid tumors, unspecified site: Secondary | ICD-10-CM | POA: Diagnosis not present

## 2023-11-06 DIAGNOSIS — R000 Tachycardia, unspecified: Secondary | ICD-10-CM | POA: Diagnosis not present

## 2023-11-06 DIAGNOSIS — G4700 Insomnia, unspecified: Secondary | ICD-10-CM | POA: Diagnosis not present

## 2023-11-06 DIAGNOSIS — I10 Essential (primary) hypertension: Secondary | ICD-10-CM | POA: Diagnosis not present

## 2023-11-06 DIAGNOSIS — C3490 Malignant neoplasm of unspecified part of unspecified bronchus or lung: Secondary | ICD-10-CM | POA: Diagnosis not present

## 2023-11-06 DIAGNOSIS — Z7189 Other specified counseling: Secondary | ICD-10-CM | POA: Diagnosis not present

## 2023-11-06 DIAGNOSIS — S72001D Fracture of unspecified part of neck of right femur, subsequent encounter for closed fracture with routine healing: Secondary | ICD-10-CM | POA: Diagnosis not present

## 2023-11-06 DIAGNOSIS — K5900 Constipation, unspecified: Secondary | ICD-10-CM | POA: Diagnosis not present

## 2023-11-06 DIAGNOSIS — D62 Acute posthemorrhagic anemia: Secondary | ICD-10-CM | POA: Diagnosis not present

## 2023-11-06 DIAGNOSIS — E785 Hyperlipidemia, unspecified: Secondary | ICD-10-CM | POA: Diagnosis not present

## 2023-11-06 DIAGNOSIS — N959 Unspecified menopausal and perimenopausal disorder: Secondary | ICD-10-CM | POA: Diagnosis not present

## 2023-11-07 DIAGNOSIS — S72001D Fracture of unspecified part of neck of right femur, subsequent encounter for closed fracture with routine healing: Secondary | ICD-10-CM | POA: Diagnosis not present

## 2023-11-07 DIAGNOSIS — C3490 Malignant neoplasm of unspecified part of unspecified bronchus or lung: Secondary | ICD-10-CM | POA: Diagnosis not present

## 2023-11-07 DIAGNOSIS — E785 Hyperlipidemia, unspecified: Secondary | ICD-10-CM | POA: Diagnosis not present

## 2023-11-07 DIAGNOSIS — N959 Unspecified menopausal and perimenopausal disorder: Secondary | ICD-10-CM | POA: Diagnosis not present

## 2023-11-07 DIAGNOSIS — D62 Acute posthemorrhagic anemia: Secondary | ICD-10-CM | POA: Diagnosis not present

## 2023-11-07 DIAGNOSIS — K5900 Constipation, unspecified: Secondary | ICD-10-CM | POA: Diagnosis not present

## 2023-11-07 DIAGNOSIS — I10 Essential (primary) hypertension: Secondary | ICD-10-CM | POA: Diagnosis not present

## 2023-11-07 DIAGNOSIS — Z7189 Other specified counseling: Secondary | ICD-10-CM | POA: Diagnosis not present

## 2023-11-07 DIAGNOSIS — G4700 Insomnia, unspecified: Secondary | ICD-10-CM | POA: Diagnosis not present

## 2023-11-07 DIAGNOSIS — C7B00 Secondary carcinoid tumors, unspecified site: Secondary | ICD-10-CM | POA: Diagnosis not present

## 2023-11-07 DIAGNOSIS — R000 Tachycardia, unspecified: Secondary | ICD-10-CM | POA: Diagnosis not present

## 2023-11-08 DIAGNOSIS — R194 Change in bowel habit: Secondary | ICD-10-CM | POA: Diagnosis not present

## 2023-11-08 DIAGNOSIS — R110 Nausea: Secondary | ICD-10-CM | POA: Diagnosis not present

## 2023-11-08 DIAGNOSIS — R1915 Other abnormal bowel sounds: Secondary | ICD-10-CM | POA: Diagnosis not present

## 2023-11-09 DIAGNOSIS — Z7189 Other specified counseling: Secondary | ICD-10-CM | POA: Diagnosis not present

## 2023-11-09 DIAGNOSIS — H34812 Central retinal vein occlusion, left eye, with macular edema: Secondary | ICD-10-CM | POA: Diagnosis not present

## 2023-11-09 DIAGNOSIS — N959 Unspecified menopausal and perimenopausal disorder: Secondary | ICD-10-CM | POA: Diagnosis not present

## 2023-11-09 DIAGNOSIS — G4700 Insomnia, unspecified: Secondary | ICD-10-CM | POA: Diagnosis not present

## 2023-11-09 DIAGNOSIS — S72001D Fracture of unspecified part of neck of right femur, subsequent encounter for closed fracture with routine healing: Secondary | ICD-10-CM | POA: Diagnosis not present

## 2023-11-09 DIAGNOSIS — I10 Essential (primary) hypertension: Secondary | ICD-10-CM | POA: Diagnosis not present

## 2023-11-09 DIAGNOSIS — E785 Hyperlipidemia, unspecified: Secondary | ICD-10-CM | POA: Diagnosis not present

## 2023-11-09 DIAGNOSIS — H47012 Ischemic optic neuropathy, left eye: Secondary | ICD-10-CM | POA: Diagnosis not present

## 2023-11-09 DIAGNOSIS — R000 Tachycardia, unspecified: Secondary | ICD-10-CM | POA: Diagnosis not present

## 2023-11-09 DIAGNOSIS — K5900 Constipation, unspecified: Secondary | ICD-10-CM | POA: Diagnosis not present

## 2023-11-09 DIAGNOSIS — D62 Acute posthemorrhagic anemia: Secondary | ICD-10-CM | POA: Diagnosis not present

## 2023-11-09 DIAGNOSIS — Z961 Presence of intraocular lens: Secondary | ICD-10-CM | POA: Diagnosis not present

## 2023-11-09 DIAGNOSIS — C7B00 Secondary carcinoid tumors, unspecified site: Secondary | ICD-10-CM | POA: Diagnosis not present

## 2023-11-09 DIAGNOSIS — C3490 Malignant neoplasm of unspecified part of unspecified bronchus or lung: Secondary | ICD-10-CM | POA: Diagnosis not present

## 2023-11-12 DIAGNOSIS — Z7189 Other specified counseling: Secondary | ICD-10-CM | POA: Diagnosis not present

## 2023-11-12 DIAGNOSIS — S72001D Fracture of unspecified part of neck of right femur, subsequent encounter for closed fracture with routine healing: Secondary | ICD-10-CM | POA: Diagnosis not present

## 2023-11-12 DIAGNOSIS — K5900 Constipation, unspecified: Secondary | ICD-10-CM | POA: Diagnosis not present

## 2023-11-12 DIAGNOSIS — E785 Hyperlipidemia, unspecified: Secondary | ICD-10-CM | POA: Diagnosis not present

## 2023-11-12 DIAGNOSIS — N959 Unspecified menopausal and perimenopausal disorder: Secondary | ICD-10-CM | POA: Diagnosis not present

## 2023-11-12 DIAGNOSIS — R000 Tachycardia, unspecified: Secondary | ICD-10-CM | POA: Diagnosis not present

## 2023-11-12 DIAGNOSIS — C3490 Malignant neoplasm of unspecified part of unspecified bronchus or lung: Secondary | ICD-10-CM | POA: Diagnosis not present

## 2023-11-12 DIAGNOSIS — G4700 Insomnia, unspecified: Secondary | ICD-10-CM | POA: Diagnosis not present

## 2023-11-12 DIAGNOSIS — R634 Abnormal weight loss: Secondary | ICD-10-CM | POA: Diagnosis not present

## 2023-11-12 DIAGNOSIS — C7B00 Secondary carcinoid tumors, unspecified site: Secondary | ICD-10-CM | POA: Diagnosis not present

## 2023-11-12 DIAGNOSIS — I10 Essential (primary) hypertension: Secondary | ICD-10-CM | POA: Diagnosis not present

## 2023-11-12 DIAGNOSIS — D62 Acute posthemorrhagic anemia: Secondary | ICD-10-CM | POA: Diagnosis not present

## 2023-11-14 DIAGNOSIS — S72001D Fracture of unspecified part of neck of right femur, subsequent encounter for closed fracture with routine healing: Secondary | ICD-10-CM | POA: Diagnosis not present

## 2023-11-14 DIAGNOSIS — E785 Hyperlipidemia, unspecified: Secondary | ICD-10-CM | POA: Diagnosis not present

## 2023-11-14 DIAGNOSIS — R000 Tachycardia, unspecified: Secondary | ICD-10-CM | POA: Diagnosis not present

## 2023-11-14 DIAGNOSIS — I10 Essential (primary) hypertension: Secondary | ICD-10-CM | POA: Diagnosis not present

## 2023-11-14 DIAGNOSIS — Z7189 Other specified counseling: Secondary | ICD-10-CM | POA: Diagnosis not present

## 2023-11-14 DIAGNOSIS — C3490 Malignant neoplasm of unspecified part of unspecified bronchus or lung: Secondary | ICD-10-CM | POA: Diagnosis not present

## 2023-11-14 DIAGNOSIS — D62 Acute posthemorrhagic anemia: Secondary | ICD-10-CM | POA: Diagnosis not present

## 2023-11-14 DIAGNOSIS — K5900 Constipation, unspecified: Secondary | ICD-10-CM | POA: Diagnosis not present

## 2023-11-14 DIAGNOSIS — C7B00 Secondary carcinoid tumors, unspecified site: Secondary | ICD-10-CM | POA: Diagnosis not present

## 2023-11-14 DIAGNOSIS — R634 Abnormal weight loss: Secondary | ICD-10-CM | POA: Diagnosis not present

## 2023-11-14 DIAGNOSIS — G4700 Insomnia, unspecified: Secondary | ICD-10-CM | POA: Diagnosis not present

## 2023-11-14 DIAGNOSIS — N959 Unspecified menopausal and perimenopausal disorder: Secondary | ICD-10-CM | POA: Diagnosis not present

## 2023-11-16 DIAGNOSIS — S72001D Fracture of unspecified part of neck of right femur, subsequent encounter for closed fracture with routine healing: Secondary | ICD-10-CM | POA: Diagnosis not present

## 2023-11-16 DIAGNOSIS — D62 Acute posthemorrhagic anemia: Secondary | ICD-10-CM | POA: Diagnosis not present

## 2023-11-16 DIAGNOSIS — G4700 Insomnia, unspecified: Secondary | ICD-10-CM | POA: Diagnosis not present

## 2023-11-16 DIAGNOSIS — C3490 Malignant neoplasm of unspecified part of unspecified bronchus or lung: Secondary | ICD-10-CM | POA: Diagnosis not present

## 2023-11-16 DIAGNOSIS — K5900 Constipation, unspecified: Secondary | ICD-10-CM | POA: Diagnosis not present

## 2023-11-16 DIAGNOSIS — R000 Tachycardia, unspecified: Secondary | ICD-10-CM | POA: Diagnosis not present

## 2023-11-16 DIAGNOSIS — Z7189 Other specified counseling: Secondary | ICD-10-CM | POA: Diagnosis not present

## 2023-11-16 DIAGNOSIS — R634 Abnormal weight loss: Secondary | ICD-10-CM | POA: Diagnosis not present

## 2023-11-16 DIAGNOSIS — C7B00 Secondary carcinoid tumors, unspecified site: Secondary | ICD-10-CM | POA: Diagnosis not present

## 2023-11-16 DIAGNOSIS — E785 Hyperlipidemia, unspecified: Secondary | ICD-10-CM | POA: Diagnosis not present

## 2023-11-16 DIAGNOSIS — N959 Unspecified menopausal and perimenopausal disorder: Secondary | ICD-10-CM | POA: Diagnosis not present

## 2023-11-16 DIAGNOSIS — I10 Essential (primary) hypertension: Secondary | ICD-10-CM | POA: Diagnosis not present

## 2023-11-22 DIAGNOSIS — T8149XA Infection following a procedure, other surgical site, initial encounter: Secondary | ICD-10-CM | POA: Diagnosis not present

## 2023-11-22 DIAGNOSIS — R2241 Localized swelling, mass and lump, right lower limb: Secondary | ICD-10-CM | POA: Diagnosis not present

## 2023-12-04 ENCOUNTER — Ambulatory Visit: Payer: Medicare HMO | Admitting: Sports Medicine

## 2023-12-12 ENCOUNTER — Ambulatory Visit: Payer: Medicare HMO | Admitting: Sports Medicine

## 2023-12-12 ENCOUNTER — Encounter: Payer: Self-pay | Admitting: Sports Medicine

## 2023-12-12 VITALS — BP 121/87 | HR 102 | Temp 96.6°F | Resp 18 | Ht 60.0 in | Wt 109.2 lb

## 2023-12-12 DIAGNOSIS — K5900 Constipation, unspecified: Secondary | ICD-10-CM

## 2023-12-12 DIAGNOSIS — S7291XD Unspecified fracture of right femur, subsequent encounter for closed fracture with routine healing: Secondary | ICD-10-CM

## 2023-12-12 DIAGNOSIS — R000 Tachycardia, unspecified: Secondary | ICD-10-CM

## 2023-12-12 DIAGNOSIS — L989 Disorder of the skin and subcutaneous tissue, unspecified: Secondary | ICD-10-CM

## 2023-12-12 DIAGNOSIS — F5101 Primary insomnia: Secondary | ICD-10-CM

## 2023-12-12 DIAGNOSIS — D649 Anemia, unspecified: Secondary | ICD-10-CM | POA: Diagnosis not present

## 2023-12-12 LAB — CBC WITH DIFFERENTIAL/PLATELET
Absolute Lymphocytes: 984 {cells}/uL (ref 850–3900)
Absolute Monocytes: 288 {cells}/uL (ref 200–950)
Basophils Absolute: 20 {cells}/uL (ref 0–200)
Basophils Relative: 0.5 %
Eosinophils Absolute: 68 {cells}/uL (ref 15–500)
Eosinophils Relative: 1.7 %
HCT: 37.1 % (ref 35.0–45.0)
Hemoglobin: 12.3 g/dL (ref 11.7–15.5)
MCH: 30.8 pg (ref 27.0–33.0)
MCHC: 33.2 g/dL (ref 32.0–36.0)
MCV: 92.8 fL (ref 80.0–100.0)
MPV: 11.4 fL (ref 7.5–12.5)
Monocytes Relative: 7.2 %
Neutro Abs: 2640 {cells}/uL (ref 1500–7800)
Neutrophils Relative %: 66 %
Platelets: 191 10*3/uL (ref 140–400)
RBC: 4 10*6/uL (ref 3.80–5.10)
RDW: 12.5 % (ref 11.0–15.0)
Total Lymphocyte: 24.6 %
WBC: 4 10*3/uL (ref 3.8–10.8)

## 2023-12-12 MED ORDER — MIRTAZAPINE 7.5 MG PO TABS: 7.5000 mg | ORAL_TABLET | Freq: Every day | ORAL | 1 refills | Status: DC

## 2023-12-12 NOTE — Progress Notes (Addendum)
Careteam: Patient Care Team: [name], MD as PCP - General (Internal Medicine) Parke Poisson, MD as PCP - Cardiology (Cardiology) Edilia Bo (Podiatry)  PLACE OF SERVICE:  [facility]  Advanced Directive information    No Known Allergies  Chief Complaint  Patient presents with   Medical Management of Chronic Issues     January follow-up   Immunizations    covid     Discussed the use of AI scribe software for clinical note transcription with the patient, who gave verbal consent to proceed.  History of Present Illness   The patient, with a history of hypertension, tachycardia, and recent hip surgery, reports overall well-being. She lives alone but currently has her daughter from [place] assisting her due to her recent hip surgery. The patient had a fall in November, which necessitated the surgery.    The patient is able to walk and perform transfers independently but struggles with bending down, lifting things, and opening heavy doors. She plans to move to an independent living facility in early February due to these difficulties.  The patient reports pain in the leg, just before the hip, managed with arthritis strength Tylenol (650mg ). She takes two tablets in the morning, afternoon, and at night, exceeding the recommended daily dosage.  The patient also reports issues with bowel movements, particularly post-surgery, and is currently taking a stool softener. She denies any urinary issues.  The patient has a history of lung surgery and reports chronic shortness of breath but denies any recent changes. She also reports frequent palpitations, feeling her heart rate is consistently over 100 . However, she denies any associated pain, discomfort, or dizziness.  The patient manages her own medications, which include amlodipine, aspirin, Coreg, vitamin D, lisinopril, multivitamin, and Crestor. She also takes an over-the-counter sleep aid, doxylamine, despite its  potential side effects.  The patient's daughter has noticed some concerning moles on the patient's back, which will be referred to a dermatologist. The patient's surgical scar on the hip appears well-healed, with no signs of infection or complications.         Review of Systems:  Review of Systems  Constitutional:  Negative for chills and fever.  HENT:  Negative for congestion and sore throat.   Eyes:  Negative for double vision.  Respiratory:  Negative for cough, sputum production and shortness of breath.   Cardiovascular:  Negative for chest pain, palpitations and leg swelling.  Gastrointestinal:  Negative for abdominal pain, heartburn and nausea.  Genitourinary:  Negative for dysuria, frequency and hematuria.  Musculoskeletal:  Positive for joint pain. Negative for falls and myalgias.  Neurological:  Negative for dizziness, sensory change and focal weakness.  Psychiatric/Behavioral:  The patient has insomnia.    Negative unless indicated in HPI.   Past Medical History:  Diagnosis Date   Arthritis    Brain tumor (HCC)    Constipation due to opioid therapy    hospitalized d/t constipation   Dyspnea    High blood pressure    Lung cancer (HCC)    Osteopenia    Osteoporosis    Parathyroid adenoma    Past Surgical History:  Procedure Laterality Date   brain meningioma excesion     CATARACT EXTRACTION     CESAREAN SECTION     GALLBLADDER SURGERY     gallbladder surgery      LOBECTOMY     TONSILLECTOMY     TONSILLECTOMY     WRIST FRACTURE SURGERY  Social History:   reports that she quit smoking about 61 years ago. Her smoking use included cigarettes. She has never used smokeless tobacco. She reports that she does not drink alcohol and does not use drugs.  History reviewed. No pertinent family history.  Medications: Patient's Medications  New Prescriptions   No medications on file  Previous Medications   ACETAMINOPHEN (TYLENOL) 650 MG CR TABLET    Take 650 mg by  mouth in the morning and at bedtime.   AMLODIPINE (NORVASC) 5 MG TABLET    Take 5 mg by mouth daily.   ASPIRIN EC 81 MG TABLET    Take 81 mg by mouth daily.   CARVEDILOL (COREG) 3.125 MG TABLET    Take 1 tablet (3.125 mg total) by mouth 2 (two) times daily.   CHOLECALCIFEROL (VITAMIN D3 PO)    Take 500 mg by mouth daily.   DOXYLAMINE SUCCINATE, SLEEP, (UNISOM PO)    Take by mouth.   ESTRADIOL (CLIMARA - DOSED IN MG/24 HR) 0.05 MG/24HR PATCH    PLACE 1 PATCH (0.05 MG TOTAL) ONTO THE SKIN ONCE A WEEK.   LATANOPROST (XALATAN) 0.005 % OPHTHALMIC SOLUTION    Place 1 drop into both eyes at bedtime.   LISINOPRIL (ZESTRIL) 10 MG TABLET    TAKE 1 TABLET BY MOUTH TWICE A DAY   MULTIPLE VITAMINS-MINERALS (DAILY MULTIVITAMIN PO)    Take 1 tablet by mouth daily.   NAPROXEN SODIUM (ALEVE) 220 MG CAPS    Take 1 capsule by mouth daily as needed.   ROSUVASTATIN (CRESTOR) 5 MG TABLET    TAKE 1 TABLET BY MOUTH EVERY DAY IN THE EVENING   VITAMIN B-12 (CYANOCOBALAMIN) 500 MCG TABLET    Take 500 mcg by mouth daily.  Modified Medications   No medications on file  Discontinued Medications   No medications on file    Physical Exam: There were no vitals filed for this visit. There is no height or weight on file to calculate BMI. BP Readings from Last 3 Encounters:  08/30/23 120/80  07/31/23 (!) 133/93  07/19/23 110/76   Wt Readings from Last 3 Encounters:  08/30/23 111 lb 9.6 oz (50.6 kg)  07/31/23 112 lb (50.8 kg)  07/19/23 111 lb 9.6 oz (50.6 kg)    Physical Exam Constitutional:      Appearance: Normal appearance.  HENT:     Head: Normocephalic and atraumatic.  Cardiovascular:     Rate and Rhythm: Normal rate and regular rhythm.     Pulses: Normal pulses.     Heart sounds: Normal heart sounds.  Pulmonary:     Effort: No respiratory distress.     Breath sounds: No stridor. No wheezing or rales.  Abdominal:     General: Bowel sounds are normal. There is no distension.     Palpations: Abdomen is  soft.     Tenderness: There is no abdominal tenderness. There is no right CVA tenderness or guarding.  Musculoskeletal:        General: No swelling.     Comments: Rt hip incision healed well  Neurological:     Mental Status: She is alert. Mental status is at baseline.     Sensory: No sensory deficit.     Motor: No weakness.     Labs reviewed: Basic Metabolic Panel: Recent Labs    06/25/23 1018 07/31/23 1020  NA 138 137  K 4.2 5.2*  CL 102 101  CO2 29 31  GLUCOSE 95 80  BUN  21 21  CREATININE 0.77 0.78  CALCIUM 9.4 9.6   Liver Function Tests: Recent Labs    06/25/23 1018 07/31/23 1020  AST 19 26  ALT 20 28  ALKPHOS 46 50  BILITOT 0.5 0.5  PROT 6.4* 6.8  ALBUMIN 4.2 4.2   No results for input(s): "LIPASE", "AMYLASE" in the last 8760 hours. No results for input(s): "AMMONIA" in the last 8760 hours. CBC: Recent Labs    06/25/23 1018 07/31/23 1020  WBC 4.5 3.2*  NEUTROABS 3.2 1.8  HGB 13.3 13.1  HCT 38.8 37.8  MCV 91.9 93.1  PLT 142* 143*   Lipid Panel: Recent Labs    06/13/23 1146  CHOL 142  HDL 70  LDLCALC 55  TRIG 89  CHOLHDL 2.0   TSH: No results for input(s): "TSH" in the last 8760 hours. A1C: No results found for: "HGBA1C"   Assessment/Plan  Hip Fracture Post-Operatiive  Damian Leavell 10/25/23 Recovering from recent surgery with limited mobility and difficulty with certain tasks. Daughter assisting temporarily, but patient will be alone for two weeks before moving to independent living. -Order home health and physical therapy to assist with recovery and daily tasks. -Continue current pain management with arthritis strength Tylenol 650mg , but reduce to maximum of 4 tablets per day to avoid exceeding 3g daily limit. -Consider use of lidocaine patches for additional pain relief.  Sleep Disturbance Difficulty sleeping, previously using doxylamine (Unisom) which is not recommended due to risk of dizziness and memory issues. -Discontinue use of  doxylamine. -Prescribe low dose Remeron to aid with sleep and appetite.  Anemia Mild anemia noted from previous blood work, likely post-operative. -Order blood test to check current hemoglobin levels.  Skin Lesions Concerning moles noted on back during physical examination. -Refer to dermatology for further evaluation.  Tachycardia Patient reports awareness of heart racing, but no associated symptoms such as chest pain or shortness of breath. -Recommend follow-up with cardiologist to evaluate these episodes. Cont with coreg  Constipation History of constipation issues, particularly post-surgery. -Continue use of stool softener as needed.     No follow-ups on file.:

## 2023-12-12 NOTE — Patient Instructions (Signed)
 YOUR PLAN:  -HIP FRACTURE POST-OPERATIVE: You are recovering from your recent hip surgery but have some limitations in mobility and daily tasks. We will arrange for home health and physical therapy to assist you. Continue taking arthritis strength Tylenol 650mg  for pain, but reduce to a maximum of 4 tablets per day to avoid exceeding the daily limit. We may also consider using lidocaine  patches for additional pain relief. Daily limit of tylenol 3 000mg  / day   -SLEEP DISTURBANCE: You have been experiencing difficulty sleeping and have been using doxylamine, which is not recommended due to potential side effects. We will discontinue doxylamine and prescribe a low dose of Remeron  to help with sleep and appetite.  -ANEMIA: You have mild anemia, likely due to your recent surgery. We will order a blood test to check your current hemoglobin levels.  -SKIN LESIONS: There are some concerning moles on your back that need further evaluation. We will refer you to a dermatologist for this.  -TACHYCARDIA: You have been experiencing episodes of a racing heart, but without other symptoms like chest pain or shortness of breath. We recommend a follow-up with your cardiologist to evaluate these episodes. Continue taking your current heart medications as prescribed.  -CONSTIPATION: You have been dealing with constipation, especially after your surgery. Continue using the stool softener as needed.  -GENERAL HEALTH MAINTENANCE: Continue taking your current medications for blood pressure and cholesterol. We will order home health and physical therapy to assist with your recovery and daily tasks. We will also check your blood counts in the clinic today and follow up with your orthopedic doctor as scheduled. Additionally, we will refer you to a dermatologist for further evaluation of your skin lesions, consider using lidocaine  patches for pain relief, prescribe a low dose of Remeron  for sleep and appetite, and recommend a  follow-up with your cardiologist to evaluate your tachycardia episodes.

## 2024-01-15 DIAGNOSIS — M25551 Pain in right hip: Secondary | ICD-10-CM | POA: Diagnosis not present

## 2024-01-17 DIAGNOSIS — D225 Melanocytic nevi of trunk: Secondary | ICD-10-CM | POA: Diagnosis not present

## 2024-01-17 DIAGNOSIS — L814 Other melanin hyperpigmentation: Secondary | ICD-10-CM | POA: Diagnosis not present

## 2024-01-17 DIAGNOSIS — L570 Actinic keratosis: Secondary | ICD-10-CM | POA: Diagnosis not present

## 2024-01-17 DIAGNOSIS — L821 Other seborrheic keratosis: Secondary | ICD-10-CM | POA: Diagnosis not present

## 2024-01-21 ENCOUNTER — Other Ambulatory Visit: Payer: Self-pay | Admitting: Internal Medicine

## 2024-01-21 ENCOUNTER — Inpatient Hospital Stay: Payer: Medicare HMO | Attending: Internal Medicine

## 2024-01-21 ENCOUNTER — Ambulatory Visit (HOSPITAL_COMMUNITY)
Admission: RE | Admit: 2024-01-21 | Discharge: 2024-01-21 | Disposition: A | Discharge status: Home / Self Care | Payer: Medicare HMO | Source: Ambulatory Visit | Attending: Internal Medicine | Admitting: Internal Medicine

## 2024-01-21 ENCOUNTER — Encounter (HOSPITAL_COMMUNITY): Payer: Self-pay

## 2024-01-21 DIAGNOSIS — R590 Localized enlarged lymph nodes: Secondary | ICD-10-CM | POA: Insufficient documentation

## 2024-01-21 DIAGNOSIS — C3411 Malignant neoplasm of upper lobe, right bronchus or lung: Secondary | ICD-10-CM | POA: Insufficient documentation

## 2024-01-21 DIAGNOSIS — C787 Secondary malignant neoplasm of liver and intrahepatic bile duct: Secondary | ICD-10-CM | POA: Diagnosis not present

## 2024-01-21 DIAGNOSIS — Z79899 Other long term (current) drug therapy: Secondary | ICD-10-CM | POA: Diagnosis not present

## 2024-01-21 DIAGNOSIS — I7121 Aneurysm of the ascending aorta, without rupture: Secondary | ICD-10-CM | POA: Insufficient documentation

## 2024-01-21 DIAGNOSIS — I700 Atherosclerosis of aorta: Secondary | ICD-10-CM | POA: Diagnosis not present

## 2024-01-21 DIAGNOSIS — I517 Cardiomegaly: Secondary | ICD-10-CM | POA: Insufficient documentation

## 2024-01-21 LAB — CBC WITH DIFFERENTIAL (CANCER CENTER ONLY)
Abs Immature Granulocytes: 0.01 10*3/uL (ref 0.00–0.07)
Basophils Absolute: 0 10*3/uL (ref 0.0–0.1)
Basophils Relative: 1 %
Eosinophils Absolute: 0.1 10*3/uL (ref 0.0–0.5)
Eosinophils Relative: 2 %
HCT: 36.9 % (ref 36.0–46.0)
Hemoglobin: 12.1 g/dL (ref 12.0–15.0)
Immature Granulocytes: 0 %
Lymphocytes Relative: 27 %
Lymphs Abs: 1 10*3/uL (ref 0.7–4.0)
MCH: 30.4 pg (ref 26.0–34.0)
MCHC: 32.8 g/dL (ref 30.0–36.0)
MCV: 92.7 fL (ref 80.0–100.0)
Monocytes Absolute: 0.3 10*3/uL (ref 0.1–1.0)
Monocytes Relative: 7 %
Neutro Abs: 2.4 10*3/uL (ref 1.7–7.7)
Neutrophils Relative %: 63 %
Platelet Count: 145 10*3/uL — ABNORMAL LOW (ref 150–400)
RBC: 3.98 MIL/uL (ref 3.87–5.11)
RDW: 12.8 % (ref 11.5–15.5)
WBC Count: 3.8 10*3/uL — ABNORMAL LOW (ref 4.0–10.5)
nRBC: 0 % (ref 0.0–0.2)

## 2024-01-21 LAB — CMP (CANCER CENTER ONLY)
ALT: 19 U/L (ref 0–44)
AST: 20 U/L (ref 15–41)
Albumin: 4.3 g/dL (ref 3.5–5.0)
Alkaline Phosphatase: 72 U/L (ref 38–126)
Anion gap: 5 (ref 5–15)
BUN: 22 mg/dL (ref 8–23)
CO2: 31 mmol/L (ref 22–32)
Calcium: 9.5 mg/dL (ref 8.9–10.3)
Chloride: 104 mmol/L (ref 98–111)
Creatinine: 0.68 mg/dL (ref 0.44–1.00)
GFR, Estimated: >60 mL/min (ref >60)
Glucose, Bld: 77 mg/dL (ref 70–99)
Potassium: 4.2 mmol/L (ref 3.5–5.1)
Sodium: 140 mmol/L (ref 135–145)
Total Bilirubin: 0.4 mg/dL (ref 0.0–1.2)
Total Protein: 7.1 g/dL (ref 6.5–8.1)

## 2024-01-21 MED ORDER — IOHEXOL 300 MG/ML  SOLN
100.0000 mL | Freq: Once | INTRAMUSCULAR | Status: AC | PRN
  Administered 2024-01-21: 100 mL via INTRAVENOUS

## 2024-01-22 ENCOUNTER — Encounter: Payer: Self-pay | Admitting: Sports Medicine

## 2024-01-22 ENCOUNTER — Ambulatory Visit (INDEPENDENT_AMBULATORY_CARE_PROVIDER_SITE_OTHER): Payer: Medicare HMO | Admitting: Sports Medicine

## 2024-01-22 VITALS — BP 116/76 | HR 73 | Temp 96.5°F | Resp 16 | Ht 60.0 in | Wt 109.8 lb

## 2024-01-22 DIAGNOSIS — M542 Cervicalgia: Secondary | ICD-10-CM

## 2024-01-22 DIAGNOSIS — M25551 Pain in right hip: Secondary | ICD-10-CM | POA: Diagnosis not present

## 2024-01-22 DIAGNOSIS — F5101 Primary insomnia: Secondary | ICD-10-CM

## 2024-01-22 DIAGNOSIS — R2689 Other abnormalities of gait and mobility: Secondary | ICD-10-CM | POA: Diagnosis not present

## 2024-01-22 DIAGNOSIS — C7B8 Other secondary neuroendocrine tumors: Secondary | ICD-10-CM

## 2024-01-22 DIAGNOSIS — C7A8 Other malignant neuroendocrine tumors: Secondary | ICD-10-CM

## 2024-01-22 NOTE — Progress Notes (Signed)
 Careteam: Patient Care Team: [name], MD as PCP - General (Internal Medicine) Parke Poisson, MD as PCP - Cardiology (Cardiology) Edilia Bo (Podiatry)  PLACE OF SERVICE:  [facility]  Advanced Directive information Does Patient Have a Medical Advance Directive?: Yes, Type of Advance Directive: Healthcare Power of [place];Living will;Out of facility DNR (pink MOST or yellow form), Does patient want to make changes to medical advance directive?: No - Patient declined  No Known Allergies  Chief Complaint  Patient presents with   Medical Management of Chronic Issues    6 week follow up.    Immunizations    Discuss the need for [employer].     Discussed the use of AI scribe software for clinical note transcription with the patient, who gave verbal consent to proceed.  History of Present Illness   Jacqueline Novitski "Jan" is a 89 year old female presents for follow-up    She recently had a follow-up with her orthopedic surgeon at [facility]. She experiences occasional pain in her right hip when getting up, which she can 'walk out'. She uses a rollator at home or within buildings but switches to a cane due to difficulty holding the rollator. She has not had any falls in the last six months and is scheduled to start physical therapy on March 4th.  She recently visited a dermatologist for spots on her back. The dermatologist indicated these spots are common and not concerning. However, she had a lesion that had not healed for three and a half years, which was treated with nitro burning. The area is now bright red and healing.  She mentions having stage four liver cancer  She had recent lab work and CT scans and she has an upcoming appointment with her oncologist to discuss this.  She experiences occasional heart palpitations, with notifications of her heart rate exceeding 100 bpm, but no dizziness or lightheadedness except when getting up. She acknowledges  not drinking enough water,  Reports that her appetite is 'very good'. She does not experience night sweats.  Regarding her sleep, she has stopped taking over-the-counter doxylamine and is now taking Remeron, which is working well. She also notes an improvement in her appetite since starting the medication.           Review of Systems:  Review of Systems  Constitutional:  Negative for chills and fever.  HENT:  Negative for congestion and sore throat.   Eyes:  Negative for double vision.  Respiratory:  Negative for cough, sputum production and shortness of breath.   Cardiovascular:  Negative for chest pain, palpitations and leg swelling.  Gastrointestinal:  Negative for abdominal pain, heartburn and nausea.  Genitourinary:  Negative for dysuria, frequency and hematuria.  Musculoskeletal:  Positive for joint pain. Negative for falls and myalgias.  Neurological:  Negative for dizziness, sensory change and focal weakness.   Negative unless indicated in HPI.   Past Medical History:  Diagnosis Date   Arthritis    Brain tumor (HCC)    Constipation due to opioid therapy    hospitalized d/t constipation   Dyspnea    High blood pressure    Lung cancer (HCC)    Osteopenia    Osteoporosis    Parathyroid adenoma    Past Surgical History:  Procedure Laterality Date   brain meningioma excesion     CATARACT EXTRACTION     CESAREAN SECTION     GALLBLADDER SURGERY     gallbladder surgery  LOBECTOMY     TONSILLECTOMY     TONSILLECTOMY     WRIST FRACTURE SURGERY     Social History:   reports that she quit smoking about 61 years ago. Her smoking use included cigarettes. She has never used smokeless tobacco. She reports that she does not drink alcohol and does not use drugs.  History reviewed. No pertinent family history.  Medications: Patient's Medications  New Prescriptions   No medications on file  Previous Medications   ACETAMINOPHEN (TYLENOL) 650 MG CR TABLET    Take 650  mg by mouth as needed.   AMLODIPINE (NORVASC) 5 MG TABLET    Take 5 mg by mouth daily.   ASPIRIN EC 81 MG TABLET    Take 81 mg by mouth daily.   CARVEDILOL (COREG) 3.125 MG TABLET    Take 1 tablet (3.125 mg total) by mouth 2 (two) times daily.   CHOLECALCIFEROL (VITAMIN D3 PO)    Take 500 mg by mouth daily.   ESTRADIOL (CLIMARA - DOSED IN MG/24 HR) 0.05 MG/24HR PATCH    PLACE 1 PATCH (0.05 MG TOTAL) ONTO THE SKIN ONCE A WEEK.   LATANOPROST (XALATAN) 0.005 % OPHTHALMIC SOLUTION    Place 1 drop into both eyes at bedtime.   LISINOPRIL (ZESTRIL) 10 MG TABLET    TAKE 1 TABLET BY MOUTH TWICE A DAY   MIRTAZAPINE (REMERON) 7.5 MG TABLET    Take 1 tablet (7.5 mg total) by mouth at bedtime.   MULTIPLE VITAMINS-MINERALS (DAILY MULTIVITAMIN PO)    Take 1 tablet by mouth daily.   ROSUVASTATIN (CRESTOR) 5 MG TABLET    TAKE 1 TABLET BY MOUTH EVERY DAY IN THE EVENING   VITAMIN B-12 (CYANOCOBALAMIN) 500 MCG TABLET    Take 500 mcg by mouth daily.  Modified Medications   No medications on file  Discontinued Medications   No medications on file    Physical Exam: Vitals:   01/22/24 1049  BP: 116/76  Pulse: 73  Resp: 16  Temp: (!) 96.5 F (35.8 C)  SpO2: 93%  Weight: 109 lb 12.8 oz (49.8 kg)  Height: 5' (1.524 m)   Body mass index is 21.44 kg/m. BP Readings from Last 3 Encounters:  01/22/24 116/76  12/12/23 121/87  08/30/23 120/80   Wt Readings from Last 3 Encounters:  01/22/24 109 lb 12.8 oz (49.8 kg)  12/12/23 109 lb 3.2 oz (49.5 kg)  08/30/23 111 lb 9.6 oz (50.6 kg)    Physical Exam Constitutional:      Appearance: Normal appearance.  HENT:     Head: Normocephalic and atraumatic.  Cardiovascular:     Rate and Rhythm: Normal rate and regular rhythm.  Pulmonary:     Effort: Pulmonary effort is normal. No respiratory distress.     Breath sounds: Normal breath sounds. No wheezing.  Abdominal:     General: Bowel sounds are normal. There is no distension.     Tenderness: There is no  abdominal tenderness. There is no guarding or rebound.     Comments:    Musculoskeletal:        General: No swelling or tenderness.  Skin:    General: Skin is dry.  Neurological:     Mental Status: She is alert. Mental status is at baseline.     Sensory: No sensory deficit.     Motor: No weakness.     Labs reviewed: Basic Metabolic Panel: Recent Labs    06/25/23 1018 07/31/23 1020 01/21/24 1152  NA  138 137 140  K 4.2 5.2* 4.2  CL 102 101 104  CO2 29 31 31   GLUCOSE 95 80 77  BUN 21 21 22   CREATININE 0.77 0.78 0.68  CALCIUM 9.4 9.6 9.5   Liver Function Tests: Recent Labs    06/25/23 1018 07/31/23 1020 01/21/24 1152  AST 19 26 20   ALT 20 28 19   ALKPHOS 46 50 72  BILITOT 0.5 0.5 0.4  PROT 6.4* 6.8 7.1  ALBUMIN 4.2 4.2 4.3   No results for input(s): "LIPASE", "AMYLASE" in the last 8760 hours. No results for input(s): "AMMONIA" in the last 8760 hours. CBC: Recent Labs    07/31/23 1020 12/12/23 1144 01/21/24 1152  WBC 3.2* 4.0 3.8*  NEUTROABS 1.8 2,640 2.4  HGB 13.1 12.3 12.1  HCT 37.8 37.1 36.9  MCV 93.1 92.8 92.7  PLT 143* 191 145*   Lipid Panel: Recent Labs    06/13/23 1146  CHOL 142  HDL 70  LDLCALC 55  TRIG 89  CHOLHDL 2.0   TSH: No results for input(s): "TSH" in the last 8760 hours. A1C: No results found for: "HGBA1C"  Assessment and Plan    Right Hip Pain Improved with occasional pain on standing.  Take tylenol prn for pain -Start physical therapy on January 29, 2024.  Insomnia Improved with cessation of over-the-counter doxylamine and initiation of Remeron. -Continue Remeron for sleep. Avoid taking day time naps  Skin Lesions Followed up with dermatology.      Adenocarcinoma of Lung  Pt had CT yesterday  -Follow-up with oncologist    Neck Pain due to Arthritis Constant pain managed with lidocaine and heat. -Continue current management.      Return in about 4 months (around 05/21/2024).:

## 2024-01-23 LAB — CHROMOGRANIN A: Chromogranin A (ng/mL): 168.6 ng/mL — ABNORMAL HIGH (ref 0.0–101.8)

## 2024-01-28 ENCOUNTER — Inpatient Hospital Stay: Payer: Medicare HMO | Attending: Internal Medicine | Admitting: Internal Medicine

## 2024-01-28 VITALS — BP 143/93 | HR 95 | Temp 97.3°F | Resp 17 | Wt 108.9 lb

## 2024-01-28 DIAGNOSIS — I7121 Aneurysm of the ascending aorta, without rupture: Secondary | ICD-10-CM | POA: Insufficient documentation

## 2024-01-28 DIAGNOSIS — Z79899 Other long term (current) drug therapy: Secondary | ICD-10-CM | POA: Insufficient documentation

## 2024-01-28 DIAGNOSIS — Z9089 Acquired absence of other organs: Secondary | ICD-10-CM | POA: Insufficient documentation

## 2024-01-28 DIAGNOSIS — Z902 Acquired absence of lung [part of]: Secondary | ICD-10-CM | POA: Insufficient documentation

## 2024-01-28 DIAGNOSIS — C3411 Malignant neoplasm of upper lobe, right bronchus or lung: Secondary | ICD-10-CM | POA: Diagnosis not present

## 2024-01-28 DIAGNOSIS — N281 Cyst of kidney, acquired: Secondary | ICD-10-CM | POA: Insufficient documentation

## 2024-01-28 DIAGNOSIS — J398 Other specified diseases of upper respiratory tract: Secondary | ICD-10-CM | POA: Diagnosis not present

## 2024-01-28 DIAGNOSIS — Z9049 Acquired absence of other specified parts of digestive tract: Secondary | ICD-10-CM | POA: Insufficient documentation

## 2024-01-28 DIAGNOSIS — C787 Secondary malignant neoplasm of liver and intrahepatic bile duct: Secondary | ICD-10-CM | POA: Insufficient documentation

## 2024-01-28 DIAGNOSIS — J984 Other disorders of lung: Secondary | ICD-10-CM | POA: Insufficient documentation

## 2024-01-28 DIAGNOSIS — I700 Atherosclerosis of aorta: Secondary | ICD-10-CM | POA: Diagnosis not present

## 2024-01-28 DIAGNOSIS — Q453 Other congenital malformations of pancreas and pancreatic duct: Secondary | ICD-10-CM | POA: Diagnosis not present

## 2024-01-28 DIAGNOSIS — I517 Cardiomegaly: Secondary | ICD-10-CM | POA: Insufficient documentation

## 2024-01-28 NOTE — Progress Notes (Signed)
 [facility] [phone]   [phone]  OFFICE PROGRESS NOTE  [name], MD [address] [place] [address]  DIAGNOSIS:  1) stage Ib (T2 a, N0, M0) non-small cell lung cancer, adenocarcinoma predominantly acinar with lipidic features diagnosed in August 2020. Molecular studies showed negative for EGFR mutation and negative PD-L1 expression. 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   PRIOR THERAPY:  Status post right upper lobectomy with lymph node dissection in [place]).   CURRENT THERAPY: Observation.  INTERVAL HISTORY: Jacqueline Hunt 89 y.o. female returns to the clinic today for 100-month follow-up visit.Discussed the use of AI scribe software for clinical note transcription with the patient, who gave verbal consent to proceed.  History of Present Illness   Jacqueline Krogh "Jan" is a 89 year old female with a history of carcinoid tumor of GI origin who presents for routine follow-up.  She has a history of carcinoid tumor of gastrointestinal origin, initially identified in November 2022 with lesions and spots in the liver. A biopsy confirmed the diagnosis, and she has been under surveillance since then. Over the last six months, there have been no significant changes in her condition, and her blood work remains stable.  Recently, she experienced a right femur fracture after being knocked down by her son's dogs during a visit to [place] for Thanksgiving. This incident required surgery and rehabilitation, and she spent about a month and a half recovering.  She reports persistent shortness of breath since her surgery, although no chest pain. Recent imaging showed some nodularity in the lung, which is suspected to be inflammation, but no immediate concerns were noted.       MEDICAL HISTORY: Past Medical History:  Diagnosis Date   Arthritis     Brain tumor (HCC)    Constipation due to opioid therapy    hospitalized d/t constipation   Dyspnea    High blood pressure    Lung cancer (HCC)    Osteopenia    Osteoporosis    Parathyroid adenoma     ALLERGIES:  has no known allergies.  MEDICATIONS:  Current Outpatient Medications  Medication Sig Dispense Refill   acetaminophen (TYLENOL) 650 MG CR tablet Take 650 mg by mouth as needed.     amLODipine (NORVASC) 5 MG tablet Take 5 mg by mouth daily.     aspirin EC 81 MG tablet Take 81 mg by mouth daily.     carvedilol (COREG) 3.125 MG tablet Take 1 tablet (3.125 mg total) by mouth 2 (two) times daily. 180 tablet 3   Cholecalciferol (VITAMIN D3 PO) Take 500 mg by mouth daily.     estradiol (CLIMARA - DOSED IN MG/24 HR) 0.05 mg/24hr patch PLACE 1 PATCH (0.05 MG TOTAL) ONTO THE SKIN ONCE A WEEK. 12 patch 3   latanoprost (XALATAN) 0.005 % ophthalmic solution Place 1 drop into both eyes at bedtime.     lisinopril (ZESTRIL) 10 MG tablet TAKE 1 TABLET BY MOUTH TWICE A DAY 180 tablet 1   mirtazapine (REMERON) 7.5 MG tablet Take 1 tablet (7.5 mg total) by mouth at bedtime. 90 tablet 1   Multiple Vitamins-Minerals (DAILY MULTIVITAMIN PO) Take 1 tablet by mouth daily.     rosuvastatin (CRESTOR) 5 MG tablet TAKE 1 TABLET BY MOUTH EVERY DAY IN THE EVENING 90 tablet 1   vitamin B-12 (CYANOCOBALAMIN) 500 MCG tablet Take 500 mcg by mouth daily.  No current facility-administered medications for this visit.    SURGICAL HISTORY:  Past Surgical History:  Procedure Laterality Date   brain meningioma excesion     CATARACT EXTRACTION     CESAREAN SECTION     GALLBLADDER SURGERY     gallbladder surgery      LOBECTOMY     TONSILLECTOMY     TONSILLECTOMY     WRIST FRACTURE SURGERY      REVIEW OF SYSTEMS:  Constitutional: negative Eyes: negative Ears, nose, mouth, throat, and face: negative Respiratory: negative Cardiovascular: negative Gastrointestinal:  negative Genitourinary:negative Integument/breast: negative Hematologic/lymphatic: negative Musculoskeletal:positive for arthralgias and muscle weakness Neurological: negative Behavioral/Psych: negative Endocrine: negative Allergic/Immunologic: negative   PHYSICAL EXAMINATION: General appearance: alert, cooperative, and no distress Head: Normocephalic, without obvious abnormality, atraumatic Neck: no adenopathy, no JVD, supple, symmetrical, trachea midline, and thyroid not enlarged, symmetric, no tenderness/mass/nodules Lymph nodes: Cervical, supraclavicular, and axillary nodes normal. Resp: clear to auscultation bilaterally Back: symmetric, no curvature. ROM normal. No CVA tenderness. Cardio: regular rate and rhythm, S1, S2 normal, no murmur, click, rub or gallop GI: soft, non-tender; bowel sounds normal; no masses,  no organomegaly Extremities: extremities normal, atraumatic, no cyanosis or edema Neurologic: Alert and oriented X 3, normal strength and tone. Normal symmetric reflexes. Normal coordination and gait  ECOG PERFORMANCE STATUS: 1 - Symptomatic but completely ambulatory  Blood pressure (!) 143/93, pulse 95, temperature (!) 97.3 F (36.3 C), temperature source Temporal, resp. rate 17, weight 108 lb 14.4 oz (49.4 kg), SpO2 98%.  LABORATORY DATA: Lab Results  Component Value Date   WBC 3.8 (L) 01/21/2024   HGB 12.1 01/21/2024   HCT 36.9 01/21/2024   MCV 92.7 01/21/2024   PLT 145 (L) 01/21/2024      Chemistry      Component Value Date/Time   NA 140 01/21/2024 1152   K 4.2 01/21/2024 1152   CL 104 01/21/2024 1152   CO2 31 01/21/2024 1152   BUN 22 01/21/2024 1152   BUN 20 03/12/2020 0000   CREATININE 0.68 01/21/2024 1152   CREATININE 0.67 08/30/2020 1512   GLU 179 03/12/2020 0000      Component Value Date/Time   CALCIUM 9.5 01/21/2024 1152   ALKPHOS 72 01/21/2024 1152   AST 20 01/21/2024 1152   ALT 19 01/21/2024 1152   BILITOT 0.4 01/21/2024 1152        RADIOGRAPHIC STUDIES: CT CHEST ABDOMEN PELVIS W CONTRAST Result Date: 01/21/2024 CLINICAL DATA:  History of metastatic neuroendocrine carcinoma follow-up. * Tracking Code: BO * EXAM: CT CHEST, ABDOMEN, AND PELVIS WITH CONTRAST TECHNIQUE: Multidetector CT imaging of the chest, abdomen and pelvis was performed following the standard protocol during bolus administration of intravenous contrast. RADIATION DOSE REDUCTION: This exam was performed according to the departmental dose-optimization program which includes automated exposure control, adjustment of the mA and/or kV according to patient size and/or use of iterative reconstruction technique. CONTRAST:  OMNIPAQUE IOHEXOL 300 MG/ML  SOLN COMPARISON:  Multiple priors including CT July 23, 2023 FINDINGS: CT CHEST FINDINGS Cardiovascular: Aneurysmal dilation of the ascending thoracic aorta measuring 4.2 cm, unchanged. Tortuous descending thoracic aorta. Cardiomegaly. Coronary artery calcifications. Mediastinum/Nodes: No pathologically enlarged mediastinal, hilar or axillar Larry lymph nodes. Mild symmetric esophageal wall thickening. No suspicious thyroid nodule. Lungs/Pleura: Central airways are patent. Posterolateral right-sided tracheal diverticulum. Similar soft tissue/scarring in the right lung apex status post right upper lobectomy. New clustered nodularity in the right lung base measuring up to 8 mm on image 43/2. Similar  pleural-based nodularity measuring 8 mm in the right lower lobe on image 27/2. Similar right middle and paramedian right lower lobe mucoid impaction and scarring. Stable vague 3-4 mm right lower lobe ground-glass opacity on image 19/2. Other previously characterized right-sided ground-glass nodularity not seen on today's examination. Stable 5 mm posterior left upper lobe pulmonary nodule with central cavitation on image 17/2. Musculoskeletal: No aggressive lytic or blastic lesion of bone. S shaped thoracolumbar spinal  curvature. CT ABDOMEN PELVIS FINDINGS Hepatobiliary: Similar hypodense hepatic metastasis.  For reference: -lesion in the dome of the liver measures 15 mm on image 47/2 previously 15 mm. -posterior right hepatic lobe subcapsular lesion measures 2.5 x 1.6 cm on image 55/2 previously 2.5 x 1.7 cm. -lesion in the central right lobe of the liver measures 14 mm on image 61/2, unchanged. No new suspicious hepatic lesion identified. Gallbladder surgically absent. Prominence of the common duct is within normal limits post cholecystectomy. Pancreas: Pancreatic divisum. No pancreatic ductal dilation or evidence of acute inflammation. Spleen: No splenomegaly. Adrenals/Urinary Tract: Bilateral adrenal glands appear normal. No hydronephrosis. Kidneys demonstrate symmetric enhancement. Right lower pole renal cysts. Urinary bladder is unremarkable for degree of distension. Stomach/Bowel: Stomach is unremarkable for degree of distension. No pathologic dilation of small or large bowel. Mild rectal wall thickening. Vascular/Lymphatic: Aortic and branch vessel atherosclerosis. Similar mesenteric adenopathy for instance at the level of the aortic bifurcation measuring 2.5 x 2.2 cm on image 82/2 previously 2.4 x 2.2 cm. Reproductive: No acute finding. Other: Trace pelvic free fluid. Musculoskeletal: No aggressive lytic or blastic lesion of bone. Right femoral intramedullary fixation hardware. IMPRESSION: 1. New clustered nodularity in the right lung base measuring up to 8 mm, favored to reflect an infectious or inflammatory process. Suggest attention on short-term interval follow-up chest CT. 2. Stable hepatic metastasis and mesenteric adenopathy. 3. Mild rectal wall thickening, correlate for proctitis. 4. Stable aneurysmal dilation of the ascending thoracic aorta measuring 4.2 cm. 5. Aortic atherosclerosis. Aortic Atherosclerosis (ICD10-I70.0). Electronically Signed   By: Maudry Mayhew M.D.   On: 01/21/2024 15:59     ASSESSMENT  AND PLAN: This is a very pleasant 89 years old white female diagnosed with stage Ib (T2 a, N0, M0) non-small cell lung cancer, adenocarcinoma predominantly acinar with lipidic features diagnosed in August 2020 status post right upper lobectomy with lymph node dissection in [place]). Molecular studies showed negative for EGFR mutation and negative PD-L1 expression. The patient is currently on observation and she is feeling fine today with no concerning complaints. Her recent scan of the chest showed no concerning findings for disease recurrence or progression in the chest but there was suspicious liver lesion that was seen on previous PET scan when she was in [place] and they were not hypermetabolic but still concerning.  I recommended for the patient to have MRI of the liver for evaluation of this lesion and to rule out metastatic disease. I ordered MRI of the liver that was performed recently and unfortunately showed concerning findings for liver metastasis. The patient underwent ultrasound-guided core biopsy of one of the liver lesion and the final pathology was consistent with well-differentiated neuroendocrine carcinoma of gastrointestinal primary. She was seen by second opinion by Dr. Lattie Corns at [facility] center and he recommended for the patient to consider PET Dotatate and treatment with octreotide after the scan if she becomes symptomatic. The patient is currently on observation. She had repeat CT scan of the chest, abdomen and pelvis performed  recently.  I personally and independently reviewed the scan and discussed the result with the patient today.  stage Ib (T2 a, N0, M0) non-small cell lung cancer, adenocarcinoma predominantly acinar with lipidic features diagnosed in August 2020. Molecular studies showed negative for EGFR mutation and negative PD-L1 expression. Status post right upper lobectomy with lymph node dissection in [place]).   Carcinoid Tumor  of GI Origin Carcinoid tumor of gastrointestinal origin with liver metastasis, diagnosed November 2022. Recent imaging and blood work show no significant changes. No new symptoms reported. Current findings do not necessitate immediate intervention. - Repeat blood work and imaging (chest, abdomen, pelvis) in six months - Schedule follow-up appointment in six months  Pulmonary Nodularity Lung nodularity likely due to inflammation, not of significant concern. Will monitor with repeat imaging in six months. - Repeat imaging in six months  Femur Fracture Right femur fracture from November 2024, post-surgery and rehabilitation. Starting physical therapy tomorrow. Reports being generally well post-surgery. - Begin physical therapy tomorrow  General Health Maintenance Good health with no new significant symptoms. Advised to continue regular physical activity and avoid falls and injuries. - Continue regular physical activity - Avoid falls and injuries  Follow-up - Schedule follow-up appointment in six months - Perform blood work and imaging one week before follow-up.   The patient was advised to call immediately if she has any other concerning symptoms in the interval.   The patient voices understanding of current disease status and treatment options and is in agreement with the current care plan.  All questions were answered. The patient knows to call the clinic with any problems, questions or concerns. We can certainly see the patient much sooner if necessary.  The total time spent in the appointment was 30 minutes.  Disclaimer: This note was dictated with voice recognition software. Similar sounding words can inadvertently be transcribed and may not be corrected upon review.

## 2024-01-29 DIAGNOSIS — M25551 Pain in right hip: Secondary | ICD-10-CM | POA: Diagnosis not present

## 2024-02-03 ENCOUNTER — Other Ambulatory Visit: Payer: Self-pay | Admitting: Internal Medicine

## 2024-02-05 DIAGNOSIS — M25551 Pain in right hip: Secondary | ICD-10-CM | POA: Diagnosis not present

## 2024-02-06 ENCOUNTER — Other Ambulatory Visit: Payer: Self-pay | Admitting: Sports Medicine

## 2024-02-06 MED ORDER — ROSUVASTATIN CALCIUM 5 MG PO TABS: 5.0000 mg | ORAL_TABLET | Freq: Every day | ORAL | 1 refills | Status: DC

## 2024-02-06 NOTE — Telephone Encounter (Signed)
 Copied from CRM [id number]. Topic: Clinical - Medication Refill >> Feb 06, 2024 11:06 AM Yvone Neu wrote: Most Recent Primary Care Visit:  Provider: [name]  Department: [facility]  Visit Type: OFFICE VISIT  Date: 01/22/2024  Medication: Rosuvastatin Calcium  Has the patient contacted their pharmacy? Yes (Agent: If no, request that the patient contact the pharmacy for the refill. If patient does not wish to contact the pharmacy document the reason why and proceed with request.) (Agent: If yes, when and what did the pharmacy advise?)  Is this the correct pharmacy for this prescription? Yes If no, delete pharmacy and type the correct one.  This is the patient's preferred pharmacy:  [id number] [place], [place] - [address] AT [facility] [address] [place] [address] Phone: [phone] Fax: [phone]   Has the prescription been filled recently? No  Is the patient out of the medication? Yes  Has the patient been seen for an appointment in the last year OR does the patient have an upcoming appointment? Yes  Can we respond through MyChart? Yes  Agent: Please be advised that Rx refills may take up to 3 business days. We ask that you follow-up with your pharmacy.

## 2024-02-08 DIAGNOSIS — M25551 Pain in right hip: Secondary | ICD-10-CM | POA: Diagnosis not present

## 2024-02-14 DIAGNOSIS — M25551 Pain in right hip: Secondary | ICD-10-CM | POA: Diagnosis not present

## 2024-02-15 DIAGNOSIS — M25551 Pain in right hip: Secondary | ICD-10-CM | POA: Diagnosis not present

## 2024-02-18 DIAGNOSIS — M25551 Pain in right hip: Secondary | ICD-10-CM | POA: Diagnosis not present

## 2024-02-19 DIAGNOSIS — H34232 Retinal artery branch occlusion, left eye: Secondary | ICD-10-CM | POA: Diagnosis not present

## 2024-02-19 DIAGNOSIS — H35352 Cystoid macular degeneration, left eye: Secondary | ICD-10-CM | POA: Diagnosis not present

## 2024-02-19 DIAGNOSIS — H3562 Retinal hemorrhage, left eye: Secondary | ICD-10-CM | POA: Diagnosis not present

## 2024-02-19 DIAGNOSIS — H34812 Central retinal vein occlusion, left eye, with macular edema: Secondary | ICD-10-CM | POA: Diagnosis not present

## 2024-02-19 DIAGNOSIS — H401131 Primary open-angle glaucoma, bilateral, mild stage: Secondary | ICD-10-CM | POA: Diagnosis not present

## 2024-02-19 DIAGNOSIS — H43812 Vitreous degeneration, left eye: Secondary | ICD-10-CM | POA: Diagnosis not present

## 2024-02-19 DIAGNOSIS — H348121 Central retinal vein occlusion, left eye, with retinal neovascularization: Secondary | ICD-10-CM | POA: Diagnosis not present

## 2024-02-26 DIAGNOSIS — M25551 Pain in right hip: Secondary | ICD-10-CM | POA: Diagnosis not present

## 2024-02-28 ENCOUNTER — Other Ambulatory Visit: Payer: Self-pay | Admitting: Sports Medicine

## 2024-02-28 DIAGNOSIS — M25551 Pain in right hip: Secondary | ICD-10-CM | POA: Diagnosis not present

## 2024-02-29 ENCOUNTER — Other Ambulatory Visit: Payer: Self-pay | Admitting: Sports Medicine

## 2024-02-29 DIAGNOSIS — H348122 Central retinal vein occlusion, left eye, stable: Secondary | ICD-10-CM | POA: Diagnosis not present

## 2024-02-29 DIAGNOSIS — H401131 Primary open-angle glaucoma, bilateral, mild stage: Secondary | ICD-10-CM | POA: Diagnosis not present

## 2024-02-29 NOTE — Telephone Encounter (Signed)
Pharmacy requested refill.  Pended Rx and sent to Dr. Jacquenette Shone for approval due to HIGH ALERT Warning.

## 2024-03-04 DIAGNOSIS — M25551 Pain in right hip: Secondary | ICD-10-CM | POA: Diagnosis not present

## 2024-03-07 DIAGNOSIS — M25551 Pain in right hip: Secondary | ICD-10-CM | POA: Diagnosis not present

## 2024-03-11 DIAGNOSIS — M25551 Pain in right hip: Secondary | ICD-10-CM | POA: Diagnosis not present

## 2024-03-14 DIAGNOSIS — M6281 Muscle weakness (generalized): Secondary | ICD-10-CM | POA: Diagnosis not present

## 2024-03-14 DIAGNOSIS — R2689 Other abnormalities of gait and mobility: Secondary | ICD-10-CM | POA: Diagnosis not present

## 2024-03-17 DIAGNOSIS — M7061 Trochanteric bursitis, right hip: Secondary | ICD-10-CM | POA: Diagnosis not present

## 2024-03-21 DIAGNOSIS — R2689 Other abnormalities of gait and mobility: Secondary | ICD-10-CM | POA: Diagnosis not present

## 2024-03-21 DIAGNOSIS — M6281 Muscle weakness (generalized): Secondary | ICD-10-CM | POA: Diagnosis not present

## 2024-03-24 ENCOUNTER — Other Ambulatory Visit: Payer: Self-pay

## 2024-03-24 ENCOUNTER — Ambulatory Visit (HOSPITAL_COMMUNITY)
Admission: EM | Admit: 2024-03-24 | Discharge: 2024-03-24 | Disposition: A | Discharge status: Home / Self Care | Attending: Family Medicine | Admitting: Family Medicine

## 2024-03-24 ENCOUNTER — Ambulatory Visit: Payer: Self-pay

## 2024-03-24 ENCOUNTER — Encounter (HOSPITAL_COMMUNITY): Payer: Self-pay | Admitting: Emergency Medicine

## 2024-03-24 DIAGNOSIS — R2689 Other abnormalities of gait and mobility: Secondary | ICD-10-CM | POA: Diagnosis not present

## 2024-03-24 DIAGNOSIS — M6281 Muscle weakness (generalized): Secondary | ICD-10-CM | POA: Diagnosis not present

## 2024-03-24 DIAGNOSIS — H9393 Unspecified disorder of ear, bilateral: Secondary | ICD-10-CM

## 2024-03-24 NOTE — Discharge Instructions (Signed)
 You were seen today for possible foreign body in the ears. We did not see anything on visual inspection, nor did anything come out with irrigation.  You may use your hearing aides as normal at this time.   If you have any further issues then please return for re-evaluation.

## 2024-03-24 NOTE — ED Triage Notes (Signed)
 Patient reports she has a hearing aid filter in each ear.  States she changed the filters in hearing aids yesterday, but when she went to charge hearing aids last night, the filters were missing.  Denies pain.  No difference in hearing with or without hearing aids based on her baseline.

## 2024-03-24 NOTE — ED Provider Notes (Signed)
 [facility]    CSN: [id number] Arrival date & time: 03/24/24  1224      History   Chief Complaint Chief Complaint  Patient presents with   Foreign Body in Ear    HPI Jacqueline Hunt is a 89 y.o. female.    Foreign Body in Ear  Patient is here for possible FB in her ears.  She took her hearing aids out last night, and noted the white tip filters were not in place.  She can only imagine they are in her ears. No pain, no discomfort.        Past Medical History:  Diagnosis Date   Arthritis    Brain tumor (HCC)    Constipation due to opioid therapy    hospitalized d/t constipation   Dyspnea    High blood pressure    Lung cancer (HCC)    Osteopenia    Osteoporosis    Parathyroid adenoma     Patient Active Problem List   Diagnosis Date Noted   Neck pain 12/20/2022   Central retinal vein occlusion with macular edema of left eye 08/14/2022   Neuroendocrine carcinoma metastatic to liver (HCC) 11/22/2021   Cough 05/31/2021   Wrist pain, acute 04/19/2021   Malignant neoplasm of upper lobe of right lung (HCC) 02/01/2021   Herpes simplex 02/01/2021   Benign neoplasm of colon 02/01/2021   Malignant neoplasm of brain (HCC) 02/01/2021   Parathyroid adenoma 02/01/2021   Adenocarcinoma of right lung, stage 1 (HCC) 09/21/2020   History of lung cancer 08/30/2020   Aortic atherosclerosis (HCC) 08/30/2020   Senile osteoporosis 08/30/2020   History of wrist fracture 08/30/2020   High blood pressure 08/30/2020   History of meningioma 08/30/2020   Primary osteoarthritis involving multiple joints 08/30/2020   Primary open angle glaucoma (POAG) of both eyes, mild stage 08/30/2020   Drug-induced constipation 07/14/2019   Hyperlipidemia 06/25/2019   Multiple thyroid nodules 06/25/2019   Lung nodule 06/10/2019   ASNHL (asymmetrical sensorineural hearing loss) 07/26/2018   Osteoarthritis 11/29/2017   Hot flashes due to surgical menopause 04/12/2017   Skin macule  03/16/2017   Benign nevus 09/22/2016   Seborrheic keratoses 09/22/2016   Skin cancer screening 09/22/2016   Primary hyperparathyroidism (HCC) 07/18/2016   Rheumatoid pannus of cervical spine (HCC) 11/10/2015   History of total hysterectomy with bilateral salpingo-oophorectomy (BSO) 11/26/2014   Mild cognitive disorder 11/26/2014   Osteopenia 11/26/2014   Restless leg 11/26/2014   Insomnia 12/24/2012   Obstructive sleep apnea (adult) (pediatric) 12/24/2012    Past Surgical History:  Procedure Laterality Date   brain meningioma excesion     CATARACT EXTRACTION     CESAREAN SECTION     GALLBLADDER SURGERY     gallbladder surgery      LOBECTOMY     TONSILLECTOMY     TONSILLECTOMY     WRIST FRACTURE SURGERY      OB History   No obstetric history on file.      Home Medications    Prior to Admission medications   Medication Sig Start Date End Date Taking? Authorizing Provider  acetaminophen (TYLENOL) 650 MG CR tablet Take 650 mg by mouth as needed.    [provider]  amLODipine  (NORVASC ) 5 MG tablet Take 5 mg by mouth daily. 06/25/23   [provider]  aspirin EC 81 MG tablet Take 81 mg by mouth daily.    [provider]  carvedilol  (COREG ) 3.125 MG tablet Take 1 tablet (3.125  mg total) by mouth 2 (two) times daily. 07/02/23 01/22/24  Croitoru, Mihai, MD  Cholecalciferol (VITAMIN D3 PO) Take 500 mg by mouth daily.    [provider]  estradiol  (CLIMARA  - DOSED IN MG/24 HR) 0.05 mg/24hr patch PLACE 1 PATCH (0.05 MG TOTAL) ONTO THE SKIN ONCE A WEEK. 10/22/23   Tye Gall, MD  latanoprost (XALATAN) 0.005 % ophthalmic solution Place 1 drop into both eyes at bedtime.    [provider]  lisinopril  (ZESTRIL ) 10 MG tablet TAKE 1 TABLET BY MOUTH TWICE A DAY 02/29/24   Tye Gall, MD  mirtazapine  (REMERON ) 7.5 MG tablet Take 1 tablet (7.5 mg total) by mouth at bedtime. 12/12/23   Tye Gall, MD  Multiple  Vitamins-Minerals (DAILY MULTIVITAMIN PO) Take 1 tablet by mouth daily.    [provider]  rosuvastatin  (CRESTOR ) 5 MG tablet Take 1 tablet (5 mg total) by mouth daily. 02/06/24   Tye Gall, MD  vitamin B-12 (CYANOCOBALAMIN) 500 MCG tablet Take 500 mcg by mouth daily.    [provider]    Family History History reviewed. No pertinent family history.  Social History Social History   Tobacco Use   Smoking status: Former    Current packs/day: 0.00    Types: Cigarettes    Quit date: 1964    Years since quitting: 61.3   Smokeless tobacco: Never  Vaping Use   Vaping status: Never Used  Substance Use Topics   Alcohol use: Never   Drug use: Never     Allergies   Patient has no known allergies.   Review of Systems Review of Systems  Constitutional: Negative.   HENT: Negative.    Respiratory: Negative.    Cardiovascular: Negative.   Gastrointestinal: Negative.   Musculoskeletal: Negative.      Physical Exam Triage Vital Signs ED Triage Vitals  Encounter Vitals Group     BP 03/24/24 1253 (!) 145/91     Systolic BP Percentile --      Diastolic BP Percentile --      Pulse Rate 03/24/24 1253 79     Resp 03/24/24 1253 20     Temp 03/24/24 1253 97.8 F (36.6 C)     Temp Source 03/24/24 1253 Oral     SpO2 03/24/24 1253 97 %     Weight --      Height --      Head Circumference --      Peak Flow --      Pain Score 03/24/24 1251 0     Pain Loc --      Pain Education --      Exclude from Growth Chart --    No data found.  Updated Vital Signs BP (!) 145/91 (BP Location: Right Arm)   Pulse 79   Temp 97.8 F (36.6 C) (Oral)   Resp 20   SpO2 97%   Visual Acuity Right Eye Distance:   Left Eye Distance:   Bilateral Distance:    Right Eye Near:   Left Eye Near:    Bilateral Near:     Physical Exam Constitutional:      Appearance: Normal appearance.  HENT:     Right Ear: Tympanic membrane and ear canal normal.     Left Ear:  Tympanic membrane and ear canal normal.     Ears:     Comments: No FB noted on exam;  patient has narrow ear canals    Nose: Nose normal.  Mouth/Throat:     Mouth: Mucous membranes are moist.  Cardiovascular:     Rate and Rhythm: Normal rate and regular rhythm.  Pulmonary:     Effort: Pulmonary effort is normal.     Breath sounds: Normal breath sounds.  Musculoskeletal:     Cervical back: Normal range of motion and neck supple.  Neurological:     General: No focal deficit present.     Mental Status: She is alert.  Psychiatric:        Mood and Affect: Mood normal.      UC Treatments / Results  Labs (all labs ordered are listed, but only abnormal results are displayed) Labs Reviewed - No data to display  EKG   Radiology No results found.  Procedures Both ears were irrigated with water;  No FB noted with irrigation  Medications Ordered in UC Medications - No data to display  Initial Impression / Assessment and Plan / UC Course  I have reviewed the triage vital signs and the nursing notes.  Pertinent labs & imaging results that were available during my care of the patient were reviewed by me and considered in my medical decision making (see chart for details).   Final Clinical Impressions(s) / UC Diagnoses   Final diagnoses:  Ear disorder, bilateral     Discharge Instructions      You were seen today for possible foreign body in the ears. We did not see anything on visual inspection, nor did anything come out with irrigation.  You may use your hearing aides as normal at this time.   If you have any further issues then please return for re-evaluation.     ED Prescriptions   None    PDMP not reviewed this encounter.   Lesle Ras, MD 03/24/24 1323

## 2024-03-24 NOTE — Telephone Encounter (Signed)
 Chief Complaint: foreign body in the ear Symptoms: none Frequency: sometime yesterday afternoon or evening Pertinent Negatives: Patient denies dizziness, bleeding, drainage Disposition: [] ED /[x] Urgent Care (no appt availability in office) / [] Appointment(In office/virtual)/ []  [facility]/ [] Home Care/ [] Refused Recommended Disposition /[] [facility]/ []  Follow-up with PCP Additional Notes: Pt reports possible foreign body in both ears. Pt states yesterday she changed the wax guard filters in her hearing aids. Then, at 2230 last night pt removed the hearing aids to charge them. She noticed then that the wax guard filters were not there. Pt believes they must be stuck inside her ears. Pt spoke to someone at [facility] that asked her to follow-up with her PCP. Pt denies pain, dizziness, bleeding, or drainage from the ears. Pt reports decreased hearing but states she is not wearing the hearing aids right now. Pt denies that there is a nurse or medica staff on site at her assisted living home. No availability today in the office. RN called [facility] to confirm. RN advised pt she needs to go to [facility]. RN provided pt with the address of the closest Urgent Care. Pt states she will get a ride there. RN advised pt if she develops pain, dizziness, bleeding, or drainage she needs to go to the ED. Pt verbalized understanding.    Copied from CRM [id number]. Topic: Clinical - Red Word Triage >> Mar 24, 2024 11:23 AM Blair Bumpers wrote: Red Word that prompted transfer to Nurse Triage: Patient states she has filters for her hearing aides. States she changed filters yesterday & went to charge them & noticed they were not in her hearing aides. She thinks they may be stuck in her ears and she needs to have them removed. Wanting to be seen today but checked schedule and there is nothing for today. Reason for Disposition  [1] Foreign body AND [2] can't remove at home  Answer Assessment - Initial Assessment  Questions 1. OBJECT: "What do you think it is?"      Wax guard filter  2. PAIN: "Is it causing any pain?" If Yes, ask: "How bad is the pain?"  (e.g., Scale 1-10; or mild, moderate, severe)   - MILD (1-3): doesn't interfere with normal activities    - MODERATE (4-7): interferes with normal activities or awakens from sleep    - SEVERE (8-10): excruciating pain, unable to do any normal activities      0/10 3. ONSET: "How long do you think it's been in there?"     Changed filters in the afternoon, does not know when they get stuck. Did not see they were missing until 2230 at night.  4. DISCHARGE: "Has there been any discharge or bleeding from the ear?" If Yes, ask: "Please describe it". (e.g., clear, cloudy, blood)     None  5. OTHER SYMPTOMS: "Do you have any other symptoms?" (e.g., decreased hearing, dizziness)     Pt changed the filters on her hearing aids yesterday and then charged her hearing aids yesterday and noticed the filters are not there. Pt does not "feel" anything in her hear but is having trouble hearing (does not have hearing aids in).  Protocols used: Ear - Foreign Body-A-AH

## 2024-03-24 NOTE — Telephone Encounter (Signed)
 Message routed to PCP Tye Gall, MD as Arlie Lain.

## 2024-03-28 DIAGNOSIS — R2689 Other abnormalities of gait and mobility: Secondary | ICD-10-CM | POA: Diagnosis not present

## 2024-03-28 DIAGNOSIS — M6281 Muscle weakness (generalized): Secondary | ICD-10-CM | POA: Diagnosis not present

## 2024-04-04 DIAGNOSIS — M6281 Muscle weakness (generalized): Secondary | ICD-10-CM | POA: Diagnosis not present

## 2024-04-04 DIAGNOSIS — R2689 Other abnormalities of gait and mobility: Secondary | ICD-10-CM | POA: Diagnosis not present

## 2024-04-07 DIAGNOSIS — M6281 Muscle weakness (generalized): Secondary | ICD-10-CM | POA: Diagnosis not present

## 2024-04-07 DIAGNOSIS — R2689 Other abnormalities of gait and mobility: Secondary | ICD-10-CM | POA: Diagnosis not present

## 2024-04-13 DIAGNOSIS — R2689 Other abnormalities of gait and mobility: Secondary | ICD-10-CM | POA: Diagnosis not present

## 2024-04-13 DIAGNOSIS — M6281 Muscle weakness (generalized): Secondary | ICD-10-CM | POA: Diagnosis not present

## 2024-04-24 DIAGNOSIS — H401131 Primary open-angle glaucoma, bilateral, mild stage: Secondary | ICD-10-CM | POA: Diagnosis not present

## 2024-04-24 DIAGNOSIS — H34232 Retinal artery branch occlusion, left eye: Secondary | ICD-10-CM | POA: Diagnosis not present

## 2024-04-24 DIAGNOSIS — H43812 Vitreous degeneration, left eye: Secondary | ICD-10-CM | POA: Diagnosis not present

## 2024-04-24 DIAGNOSIS — H34812 Central retinal vein occlusion, left eye, with macular edema: Secondary | ICD-10-CM | POA: Diagnosis not present

## 2024-04-24 DIAGNOSIS — H3562 Retinal hemorrhage, left eye: Secondary | ICD-10-CM | POA: Diagnosis not present

## 2024-04-24 DIAGNOSIS — H35352 Cystoid macular degeneration, left eye: Secondary | ICD-10-CM | POA: Diagnosis not present

## 2024-04-24 DIAGNOSIS — H348121 Central retinal vein occlusion, left eye, with retinal neovascularization: Secondary | ICD-10-CM | POA: Diagnosis not present

## 2024-04-28 ENCOUNTER — Telehealth: Admitting: Sports Medicine

## 2024-04-28 NOTE — Telephone Encounter (Signed)
 Copied from CRM [id number]. Topic: Clinical - Prescription Issue >> Apr 28, 2024  9:50 AM Shelby Dessert H wrote: Reason for CRM: Patient is calling because she is needing all her medicine rx reinstated and filled through Dr. Marily Shows and she is requesting a 90 day supply, patient is also wanting them to be filled at [id number] [place], [place] - [address] AT [facility] [address] [place] [address] Phone: [phone] Fax: [phone] Hours: Not open 24 hours. Her latanoprost (XALATAN) 0.005 % ophthalmic solution is administered by her eye doctor but if Dr. Marily Shows can fill it can you included as well.    Called patient to advise her we cannot refill atanoprost (XALATAN) 0.005 % ophthalmic solution because we have not seen her in office for this issue and it may conflict with insurance if we try to send it in, she will need to call her eye doctor to refill this medication we do not currently have a provider name on file for this medication.

## 2024-04-29 DIAGNOSIS — M6281 Muscle weakness (generalized): Secondary | ICD-10-CM | POA: Diagnosis not present

## 2024-04-29 DIAGNOSIS — R2689 Other abnormalities of gait and mobility: Secondary | ICD-10-CM | POA: Diagnosis not present

## 2024-05-01 DIAGNOSIS — M6281 Muscle weakness (generalized): Secondary | ICD-10-CM | POA: Diagnosis not present

## 2024-05-01 DIAGNOSIS — R2689 Other abnormalities of gait and mobility: Secondary | ICD-10-CM | POA: Diagnosis not present

## 2024-05-06 DIAGNOSIS — R2689 Other abnormalities of gait and mobility: Secondary | ICD-10-CM | POA: Diagnosis not present

## 2024-05-06 DIAGNOSIS — M6281 Muscle weakness (generalized): Secondary | ICD-10-CM | POA: Diagnosis not present

## 2024-05-08 DIAGNOSIS — M6281 Muscle weakness (generalized): Secondary | ICD-10-CM | POA: Diagnosis not present

## 2024-05-08 DIAGNOSIS — R2689 Other abnormalities of gait and mobility: Secondary | ICD-10-CM | POA: Diagnosis not present

## 2024-05-20 ENCOUNTER — Ambulatory Visit (INDEPENDENT_AMBULATORY_CARE_PROVIDER_SITE_OTHER): Payer: Medicare HMO | Admitting: Sports Medicine

## 2024-05-20 ENCOUNTER — Encounter: Payer: Self-pay | Admitting: Sports Medicine

## 2024-05-20 VITALS — BP 110/86 | HR 69 | Temp 97.7°F | Resp 11 | Ht 60.0 in | Wt 114.4 lb

## 2024-05-20 DIAGNOSIS — M542 Cervicalgia: Secondary | ICD-10-CM

## 2024-05-20 DIAGNOSIS — C7A8 Other malignant neuroendocrine tumors: Secondary | ICD-10-CM | POA: Diagnosis not present

## 2024-05-20 DIAGNOSIS — C7B8 Other secondary neuroendocrine tumors: Secondary | ICD-10-CM

## 2024-05-20 DIAGNOSIS — I10 Essential (primary) hypertension: Secondary | ICD-10-CM | POA: Diagnosis not present

## 2024-05-20 DIAGNOSIS — G629 Polyneuropathy, unspecified: Secondary | ICD-10-CM

## 2024-05-20 DIAGNOSIS — R2689 Other abnormalities of gait and mobility: Secondary | ICD-10-CM | POA: Diagnosis not present

## 2024-05-20 DIAGNOSIS — M6281 Muscle weakness (generalized): Secondary | ICD-10-CM | POA: Diagnosis not present

## 2024-05-20 NOTE — Progress Notes (Signed)
 Careteam: Patient Care Team: [name], MD as PCP - General (Internal Medicine) Loni Soyla LABOR, MD as PCP - Cardiology (Cardiology) Levora Maeola CROME (Podiatry)  PLACE OF SERVICE:  [facility]  Advanced Directive information    No Known Allergies  Chief Complaint  Patient presents with   Medical Management of Chronic Issues    4 month follow up       Discussed the use of AI scribe software for clinical note transcription with the patient, who gave verbal consent to proceed.  History of Present Illness    Jacqueline Hunt is a 89 year old female who presents with numbness in her fingers and worsening neck pain.  She experiences ongoing numbness in her fingers, which began with tingling and has progressed to numbness, affecting daily activities such as putting backs on earrings, buttoning, and using zippers. The numbness is localized to her fingers and does not affect her arms or hands. She is able to hold objects without dropping them.  She has worsening neck pain, which she attributes to arthritis. The pain is bilateral, and she uses lidocaine  for relief. She also takes Tylenol as needed, although infrequently.  She is undergoing physical therapy for her legs but feels she is not improving. She participates in chair yoga and group physical therapy sessions twice a week and walks almost every day, covering about a mile. Despite these efforts, she reports poor balance and leg strength, affecting her walking. She does not experience hip or knee pain but feels unsteady, particularly in the mornings.  She has a history of liver and lung cancer, with regular follow-ups every six months, including blood work and scans .    She lives independently at [facility], an independent living facility, and takes her meals in the dining hall for dinner. She reports a good appetite and occasional use of a stool softener if she misses a bowel movement. No dizziness, lightheadedness,  pain with urination, blood in urine, nausea, vomiting, heartburn, fever, chills, or night sweats. She reports a runny nose in the mornings but no post-nasal drip or cough. Her sleep is aided by medication, and she denies feeling depressed or having a lack of interest in activities.  Review of Systems:  Review of Systems  Constitutional:  Negative for fever and malaise/fatigue.  HENT:  Negative for congestion and sore throat.        Post nasal drip  Respiratory:  Negative for cough and shortness of breath.   Cardiovascular:  Negative for chest pain, palpitations and leg swelling.  Gastrointestinal:  Negative for constipation, heartburn, nausea and vomiting.  Genitourinary:  Negative for dysuria and hematuria.  Musculoskeletal:  Positive for neck pain. Negative for falls.  Neurological:  Negative for dizziness.  Psychiatric/Behavioral:  The patient does not have insomnia.    Negative unless indicated in HPI.   Past Medical History:  Diagnosis Date   Arthritis    Brain tumor (HCC)    Constipation due to opioid therapy    hospitalized d/t constipation   Dyspnea    High blood pressure    Lung cancer (HCC)    Osteopenia    Osteoporosis    Parathyroid adenoma    Past Surgical History:  Procedure Laterality Date   brain meningioma excesion     CATARACT EXTRACTION     CESAREAN SECTION     GALLBLADDER SURGERY     gallbladder surgery      LOBECTOMY     TONSILLECTOMY  TONSILLECTOMY     WRIST FRACTURE SURGERY     Social History:   reports that she quit smoking about 61 years ago. Her smoking use included cigarettes. She has never used smokeless tobacco. She reports that she does not drink alcohol and does not use drugs.  History reviewed. No pertinent family history.  Medications: Patient's Medications  New Prescriptions   No medications on file  Previous Medications   ACETAMINOPHEN (TYLENOL) 650 MG CR TABLET    Take 650 mg by mouth as needed.   AMLODIPINE  (NORVASC ) 5 MG  TABLET    Take 5 mg by mouth daily.   ASPIRIN EC 81 MG TABLET    Take 81 mg by mouth daily.   CARVEDILOL  (COREG ) 3.125 MG TABLET    Take 1 tablet (3.125 mg total) by mouth 2 (two) times daily.   CHOLECALCIFEROL (VITAMIN D3 PO)    Take 500 mg by mouth daily.   ESTRADIOL  (CLIMARA  - DOSED IN MG/24 HR) 0.05 MG/24HR PATCH    PLACE 1 PATCH (0.05 MG TOTAL) ONTO THE SKIN ONCE A WEEK.   LATANOPROST (XALATAN) 0.005 % OPHTHALMIC SOLUTION    Place 1 drop into both eyes at bedtime.   LISINOPRIL  (ZESTRIL ) 10 MG TABLET    TAKE 1 TABLET BY MOUTH TWICE A DAY   MIRTAZAPINE  (REMERON ) 7.5 MG TABLET    Take 1 tablet (7.5 mg total) by mouth at bedtime.   MULTIPLE VITAMINS-MINERALS (DAILY MULTIVITAMIN PO)    Take 1 tablet by mouth daily.   ROSUVASTATIN  (CRESTOR ) 5 MG TABLET    Take 1 tablet (5 mg total) by mouth daily.   VITAMIN B-12 (CYANOCOBALAMIN) 500 MCG TABLET    Take 500 mcg by mouth daily.  Modified Medications   No medications on file  Discontinued Medications   No medications on file    Physical Exam: Vitals:   05/20/24 1250  BP: 110/86  Pulse: 69  Resp: 11  Temp: 97.7 F (36.5 C)  SpO2: 98%  Weight: 114 lb 6.4 oz (51.9 kg)  Height: 5' (1.524 m)   Body mass index is 22.34 kg/m. BP Readings from Last 3 Encounters:  05/20/24 110/86  03/24/24 (!) 145/91  01/28/24 (!) 143/93   Wt Readings from Last 3 Encounters:  05/20/24 114 lb 6.4 oz (51.9 kg)  01/28/24 108 lb 14.4 oz (49.4 kg)  01/22/24 109 lb 12.8 oz (49.8 kg)    Physical Exam Constitutional:      Appearance: Normal appearance.  HENT:     Head: Normocephalic and atraumatic.   Cardiovascular:     Rate and Rhythm: Normal rate and regular rhythm.  Pulmonary:     Effort: Pulmonary effort is normal. No respiratory distress.     Breath sounds: Normal breath sounds. No wheezing.  Abdominal:     General: Bowel sounds are normal. There is no distension.     Tenderness: There is no abdominal tenderness. There is no guarding or rebound.      Comments:     Musculoskeletal:        General: No swelling or tenderness.   Neurological:     Mental Status: She is alert. Mental status is at baseline.     Motor: No weakness.     Labs reviewed: Basic Metabolic Panel: Recent Labs    06/25/23 1018 07/31/23 1020 01/21/24 1152  NA 138 137 140  K 4.2 5.2* 4.2  CL 102 101 104  CO2 29 31 31   GLUCOSE 95 80 77  BUN 21 21 22   CREATININE 0.77 0.78 0.68  CALCIUM  9.4 9.6 9.5   Liver Function Tests: Recent Labs    06/25/23 1018 07/31/23 1020 01/21/24 1152  AST 19 26 20   ALT 20 28 19   ALKPHOS 46 50 72  BILITOT 0.5 0.5 0.4  PROT 6.4* 6.8 7.1  ALBUMIN 4.2 4.2 4.3   No results for input(s): LIPASE, AMYLASE in the last 8760 hours. No results for input(s): AMMONIA in the last 8760 hours. CBC: Recent Labs    07/31/23 1020 12/12/23 1144 01/21/24 1152  WBC 3.2* 4.0 3.8*  NEUTROABS 1.8 2,640 2.4  HGB 13.1 12.3 12.1  HCT 37.8 37.1 36.9  MCV 93.1 92.8 92.7  PLT 143* 191 145*   Lipid Panel: Recent Labs    06/13/23 1146  CHOL 142  HDL 70  LDLCALC 55  TRIG 89  CHOLHDL 2.0   TSH: No results for input(s): TSH in the last 8760 hours. A1C: No results found for: HGBA1C  Assessment and Plan Assessment & Plan  1. Primary hypertension (Primary) At goal  Denies dizziness Cont with the same  2. Neuropathy Pt c/o tingling and numbness in her fingers Has good hand grip  Informed patient about the side effects of gabapentin, she is not interested in taking Instructed to use lidocaine  cream  3. Neck pain Take tylenol prn for pain  Use lidocaine  patch  4. 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 Balance problem Cont with physical therapy and home exercises.

## 2024-05-27 DIAGNOSIS — M6281 Muscle weakness (generalized): Secondary | ICD-10-CM | POA: Diagnosis not present

## 2024-05-27 DIAGNOSIS — R2689 Other abnormalities of gait and mobility: Secondary | ICD-10-CM | POA: Diagnosis not present

## 2024-05-29 DIAGNOSIS — M6281 Muscle weakness (generalized): Secondary | ICD-10-CM | POA: Diagnosis not present

## 2024-05-29 DIAGNOSIS — R2689 Other abnormalities of gait and mobility: Secondary | ICD-10-CM | POA: Diagnosis not present

## 2024-06-03 DIAGNOSIS — M6281 Muscle weakness (generalized): Secondary | ICD-10-CM | POA: Diagnosis not present

## 2024-06-03 DIAGNOSIS — R2689 Other abnormalities of gait and mobility: Secondary | ICD-10-CM | POA: Diagnosis not present

## 2024-06-05 ENCOUNTER — Other Ambulatory Visit: Payer: Self-pay | Admitting: Sports Medicine

## 2024-06-05 DIAGNOSIS — M545 Low back pain, unspecified: Secondary | ICD-10-CM | POA: Diagnosis not present

## 2024-06-05 DIAGNOSIS — S7291XD Unspecified fracture of right femur, subsequent encounter for closed fracture with routine healing: Secondary | ICD-10-CM

## 2024-06-05 DIAGNOSIS — M25551 Pain in right hip: Secondary | ICD-10-CM | POA: Diagnosis not present

## 2024-06-05 DIAGNOSIS — M25561 Pain in right knee: Secondary | ICD-10-CM | POA: Diagnosis not present

## 2024-06-05 DIAGNOSIS — F5101 Primary insomnia: Secondary | ICD-10-CM

## 2024-06-06 DIAGNOSIS — R2689 Other abnormalities of gait and mobility: Secondary | ICD-10-CM | POA: Diagnosis not present

## 2024-06-06 DIAGNOSIS — M6281 Muscle weakness (generalized): Secondary | ICD-10-CM | POA: Diagnosis not present

## 2024-06-10 DIAGNOSIS — M6281 Muscle weakness (generalized): Secondary | ICD-10-CM | POA: Diagnosis not present

## 2024-06-10 DIAGNOSIS — R2689 Other abnormalities of gait and mobility: Secondary | ICD-10-CM | POA: Diagnosis not present

## 2024-06-12 DIAGNOSIS — M6281 Muscle weakness (generalized): Secondary | ICD-10-CM | POA: Diagnosis not present

## 2024-06-12 DIAGNOSIS — R2689 Other abnormalities of gait and mobility: Secondary | ICD-10-CM | POA: Diagnosis not present

## 2024-06-13 ENCOUNTER — Other Ambulatory Visit: Payer: Self-pay | Admitting: Internal Medicine

## 2024-06-18 DIAGNOSIS — L570 Actinic keratosis: Secondary | ICD-10-CM | POA: Diagnosis not present

## 2024-06-23 DIAGNOSIS — M6281 Muscle weakness (generalized): Secondary | ICD-10-CM | POA: Diagnosis not present

## 2024-06-23 DIAGNOSIS — R2689 Other abnormalities of gait and mobility: Secondary | ICD-10-CM | POA: Diagnosis not present

## 2024-06-26 DIAGNOSIS — R2689 Other abnormalities of gait and mobility: Secondary | ICD-10-CM | POA: Diagnosis not present

## 2024-06-26 DIAGNOSIS — M6281 Muscle weakness (generalized): Secondary | ICD-10-CM | POA: Diagnosis not present

## 2024-06-28 ENCOUNTER — Other Ambulatory Visit: Payer: Self-pay | Admitting: Cardiovascular Disease

## 2024-07-03 DIAGNOSIS — Z961 Presence of intraocular lens: Secondary | ICD-10-CM | POA: Diagnosis not present

## 2024-07-03 DIAGNOSIS — M6281 Muscle weakness (generalized): Secondary | ICD-10-CM | POA: Diagnosis not present

## 2024-07-03 DIAGNOSIS — H34812 Central retinal vein occlusion, left eye, with macular edema: Secondary | ICD-10-CM | POA: Diagnosis not present

## 2024-07-03 DIAGNOSIS — R2689 Other abnormalities of gait and mobility: Secondary | ICD-10-CM | POA: Diagnosis not present

## 2024-07-03 DIAGNOSIS — H401131 Primary open-angle glaucoma, bilateral, mild stage: Secondary | ICD-10-CM | POA: Diagnosis not present

## 2024-07-10 DIAGNOSIS — R2689 Other abnormalities of gait and mobility: Secondary | ICD-10-CM | POA: Diagnosis not present

## 2024-07-10 DIAGNOSIS — M6281 Muscle weakness (generalized): Secondary | ICD-10-CM | POA: Diagnosis not present

## 2024-07-16 DIAGNOSIS — R2689 Other abnormalities of gait and mobility: Secondary | ICD-10-CM | POA: Diagnosis not present

## 2024-07-16 DIAGNOSIS — M6281 Muscle weakness (generalized): Secondary | ICD-10-CM | POA: Diagnosis not present

## 2024-07-18 DIAGNOSIS — M6281 Muscle weakness (generalized): Secondary | ICD-10-CM | POA: Diagnosis not present

## 2024-07-18 DIAGNOSIS — R2689 Other abnormalities of gait and mobility: Secondary | ICD-10-CM | POA: Diagnosis not present

## 2024-07-21 ENCOUNTER — Encounter (HOSPITAL_COMMUNITY): Payer: Self-pay

## 2024-07-21 ENCOUNTER — Telehealth: Payer: Self-pay | Admitting: Internal Medicine

## 2024-07-21 ENCOUNTER — Ambulatory Visit (HOSPITAL_COMMUNITY)

## 2024-07-21 ENCOUNTER — Inpatient Hospital Stay

## 2024-07-21 ENCOUNTER — Other Ambulatory Visit: Payer: Self-pay | Admitting: Sports Medicine

## 2024-07-21 NOTE — Telephone Encounter (Signed)
 Rescheduled appointments per the patients request. The patient had a family emergency and had to reschedule labs, CT and follow up. I have rescheduled the labs and follow up appointments with the patient. I have contacted the radiology department to please contact the patient to reschedule the CT scan.

## 2024-07-21 NOTE — Telephone Encounter (Signed)
 High risk warning populated when attempting to refill medication  Please review and approval if appropriate   Thanks,  [facility] W/CMA

## 2024-07-23 ENCOUNTER — Other Ambulatory Visit: Payer: Self-pay

## 2024-07-23 ENCOUNTER — Other Ambulatory Visit: Payer: Self-pay | Admitting: Sports Medicine

## 2024-07-23 NOTE — Telephone Encounter (Unsigned)
 Copied from CRM [id number]. Topic: Clinical - Medication Refill >> Jul 23, 2024  9:39 AM Miquel SAILOR wrote: Medication: lisinopril  (ZESTRIL ) 10 MG tablet   Has the patient contacted their pharmacy? Yes (Agent: If no, request that the patient contact the pharmacy for the refill. If patient does not wish to contact the pharmacy document the reason why and proceed with request.) (Agent: If yes, when and what did the pharmacy advise?)  This is the patient's preferred pharmacy:  [id number] [place], [place] - [address] AT [facility] [address] [place] [address] Phone: [phone] Fax: [phone]  Is this the correct pharmacy for this prescription? Yes If no, delete pharmacy and type the correct one.   Has the prescription been filled recently? Yes  Is the patient out of the medication? Yes  Has the patient been seen for an appointment in the last year OR does the patient have an upcoming appointment? Yes  Can we respond through MyChart? Yes  Agent: Please be advised that Rx refills may take up to 3 business days. We ask that you follow-up with your pharmacy.

## 2024-07-23 NOTE — Telephone Encounter (Signed)
 Patient called and said that she is completely out of this medication and needs it refilled asap.

## 2024-07-24 ENCOUNTER — Other Ambulatory Visit: Payer: Self-pay | Admitting: Internal Medicine

## 2024-07-24 ENCOUNTER — Telehealth: Payer: Self-pay

## 2024-07-24 DIAGNOSIS — M7061 Trochanteric bursitis, right hip: Secondary | ICD-10-CM | POA: Diagnosis not present

## 2024-07-24 MED ORDER — LISINOPRIL 10 MG PO TABS: 10.0000 mg | ORAL_TABLET | Freq: Two times a day (BID) | ORAL | 1 refills | Status: DC

## 2024-07-24 MED ORDER — LISINOPRIL 10 MG PO TABS: 10.0000 mg | ORAL_TABLET | Freq: Two times a day (BID) | ORAL | 1 refills | Status: AC

## 2024-07-24 NOTE — Telephone Encounter (Signed)
 Copied from CRM [id number]. Topic: Clinical - Prescription Issue >> Jul 23, 2024  4:08 PM Alfonso ORN wrote: Reason for CRM: Patient calling on the status of the medication refill for the lisinopril  (ZESTRIL ) 10 MG tablet , patient is currently out of the medication  Patient stated she call for the medication refill 4 days ago

## 2024-07-26 ENCOUNTER — Other Ambulatory Visit: Payer: Self-pay | Admitting: Sports Medicine

## 2024-07-30 ENCOUNTER — Inpatient Hospital Stay: Admitting: Internal Medicine

## 2024-07-30 ENCOUNTER — Ambulatory Visit (HOSPITAL_COMMUNITY)
Admission: RE | Admit: 2024-07-30 | Discharge: 2024-07-30 | Disposition: A | Discharge status: Home / Self Care | Source: Ambulatory Visit | Attending: Internal Medicine | Admitting: Internal Medicine

## 2024-07-30 ENCOUNTER — Inpatient Hospital Stay: Attending: Internal Medicine

## 2024-07-30 ENCOUNTER — Telehealth: Payer: Self-pay | Admitting: Internal Medicine

## 2024-07-30 DIAGNOSIS — C787 Secondary malignant neoplasm of liver and intrahepatic bile duct: Secondary | ICD-10-CM | POA: Diagnosis not present

## 2024-07-30 DIAGNOSIS — Z85118 Personal history of other malignant neoplasm of bronchus and lung: Secondary | ICD-10-CM | POA: Diagnosis not present

## 2024-07-30 DIAGNOSIS — Z79899 Other long term (current) drug therapy: Secondary | ICD-10-CM | POA: Diagnosis not present

## 2024-07-30 DIAGNOSIS — C7B02 Secondary carcinoid tumors of liver: Secondary | ICD-10-CM | POA: Diagnosis not present

## 2024-07-30 DIAGNOSIS — M810 Age-related osteoporosis without current pathological fracture: Secondary | ICD-10-CM | POA: Insufficient documentation

## 2024-07-30 DIAGNOSIS — C3411 Malignant neoplasm of upper lobe, right bronchus or lung: Secondary | ICD-10-CM | POA: Diagnosis not present

## 2024-07-30 DIAGNOSIS — Z902 Acquired absence of lung [part of]: Secondary | ICD-10-CM | POA: Diagnosis not present

## 2024-07-30 DIAGNOSIS — Z90710 Acquired absence of both cervix and uterus: Secondary | ICD-10-CM | POA: Diagnosis not present

## 2024-07-30 DIAGNOSIS — C7A00 Malignant carcinoid tumor of unspecified site: Secondary | ICD-10-CM | POA: Insufficient documentation

## 2024-07-30 DIAGNOSIS — I7121 Aneurysm of the ascending aorta, without rupture: Secondary | ICD-10-CM | POA: Diagnosis not present

## 2024-07-30 DIAGNOSIS — K5790 Diverticulosis of intestine, part unspecified, without perforation or abscess without bleeding: Secondary | ICD-10-CM | POA: Diagnosis not present

## 2024-07-30 LAB — CBC WITH DIFFERENTIAL (CANCER CENTER ONLY)
Abs Immature Granulocytes: 0.01 K/uL (ref 0.00–0.07)
Basophils Absolute: 0 K/uL (ref 0.0–0.1)
Basophils Relative: 1 %
Eosinophils Absolute: 0.1 K/uL (ref 0.0–0.5)
Eosinophils Relative: 2 %
HCT: 40.6 % (ref 36.0–46.0)
Hemoglobin: 13.7 g/dL (ref 12.0–15.0)
Immature Granulocytes: 0 %
Lymphocytes Relative: 24 %
Lymphs Abs: 1.4 K/uL (ref 0.7–4.0)
MCH: 30.4 pg (ref 26.0–34.0)
MCHC: 33.7 g/dL (ref 30.0–36.0)
MCV: 90.2 fL (ref 80.0–100.0)
Monocytes Absolute: 0.4 K/uL (ref 0.1–1.0)
Monocytes Relative: 7 %
Neutro Abs: 3.9 K/uL (ref 1.7–7.7)
Neutrophils Relative %: 66 %
Platelet Count: 185 K/uL (ref 150–400)
RBC: 4.5 MIL/uL (ref 3.87–5.11)
RDW: 13.2 % (ref 11.5–15.5)
WBC Count: 5.9 K/uL (ref 4.0–10.5)
nRBC: 0 % (ref 0.0–0.2)

## 2024-07-30 LAB — CMP (CANCER CENTER ONLY)
ALT: 22 U/L (ref 0–44)
AST: 17 U/L (ref 15–41)
Albumin: 3.9 g/dL (ref 3.5–5.0)
Alkaline Phosphatase: 66 U/L (ref 38–126)
Anion gap: 5 (ref 5–15)
BUN: 20 mg/dL (ref 8–23)
CO2: 33 mmol/L — ABNORMAL HIGH (ref 22–32)
Calcium: 9.1 mg/dL (ref 8.9–10.3)
Chloride: 102 mmol/L (ref 98–111)
Creatinine: 0.83 mg/dL (ref 0.44–1.00)
GFR, Estimated: >60 mL/min (ref >60)
Glucose, Bld: 101 mg/dL — ABNORMAL HIGH (ref 70–99)
Potassium: 4 mmol/L (ref 3.5–5.1)
Sodium: 140 mmol/L (ref 135–145)
Total Bilirubin: 0.4 mg/dL (ref 0.0–1.2)
Total Protein: 6.8 g/dL (ref 6.5–8.1)

## 2024-07-30 MED ORDER — IOHEXOL 300 MG/ML  SOLN
100.0000 mL | Freq: Once | INTRAMUSCULAR | Status: AC | PRN
Start: 2024-07-30 — End: 2024-07-30
  Administered 2024-07-30: 80 mL via INTRAVENOUS

## 2024-07-30 MED ORDER — CARVEDILOL 3.125 MG PO TABS: 3.1250 mg | ORAL_TABLET | Freq: Two times a day (BID) | ORAL | 0 refills | Status: DC

## 2024-07-30 MED ORDER — IOHEXOL 9 MG/ML PO SOLN
ORAL | Status: AC
  Filled 2024-07-30: qty 1000

## 2024-07-30 MED ORDER — IOHEXOL 9 MG/ML PO SOLN
1000.0000 mL | ORAL | Status: AC
  Administered 2024-07-30: 1000 mL via ORAL

## 2024-07-30 MED ORDER — AMLODIPINE BESYLATE 5 MG PO TABS: 5.0000 mg | ORAL_TABLET | Freq: Every day | ORAL | 0 refills | Status: DC

## 2024-07-30 NOTE — Telephone Encounter (Signed)
 RX sent in

## 2024-07-30 NOTE — Telephone Encounter (Signed)
*  STAT* If patient is at the pharmacy, call can be transferred to refill team.   1. Which medications need to be refilled? (please list name of each medication and dose if known)   carvedilol  (COREG ) 3.125 MG tablet    amLODipine  (NORVASC ) 5 MG tablet   NEW PHARMACY   4. Which pharmacy/location (including street and city if local pharmacy) is medication to be sent to?   [id number] [place], [place] - [address] AT [facility] [address] [facility] [address] Phone: [phone]  Fax: [phone]     5. Do they need a 30 day or 90 day supply? 90    Scheduled 11/25

## 2024-07-31 DIAGNOSIS — R2689 Other abnormalities of gait and mobility: Secondary | ICD-10-CM | POA: Diagnosis not present

## 2024-07-31 DIAGNOSIS — M6281 Muscle weakness (generalized): Secondary | ICD-10-CM | POA: Diagnosis not present

## 2024-07-31 LAB — CHROMOGRANIN A: Chromogranin A (ng/mL): 190.8 ng/mL — ABNORMAL HIGH (ref 0.0–101.8)

## 2024-08-01 DIAGNOSIS — R2689 Other abnormalities of gait and mobility: Secondary | ICD-10-CM | POA: Diagnosis not present

## 2024-08-01 DIAGNOSIS — M6281 Muscle weakness (generalized): Secondary | ICD-10-CM | POA: Diagnosis not present

## 2024-08-06 DIAGNOSIS — H34232 Retinal artery branch occlusion, left eye: Secondary | ICD-10-CM | POA: Diagnosis not present

## 2024-08-06 DIAGNOSIS — H35352 Cystoid macular degeneration, left eye: Secondary | ICD-10-CM | POA: Diagnosis not present

## 2024-08-06 DIAGNOSIS — H3562 Retinal hemorrhage, left eye: Secondary | ICD-10-CM | POA: Diagnosis not present

## 2024-08-06 DIAGNOSIS — H34812 Central retinal vein occlusion, left eye, with macular edema: Secondary | ICD-10-CM | POA: Diagnosis not present

## 2024-08-06 DIAGNOSIS — H348121 Central retinal vein occlusion, left eye, with retinal neovascularization: Secondary | ICD-10-CM | POA: Diagnosis not present

## 2024-08-06 DIAGNOSIS — H401131 Primary open-angle glaucoma, bilateral, mild stage: Secondary | ICD-10-CM | POA: Diagnosis not present

## 2024-08-06 DIAGNOSIS — H43812 Vitreous degeneration, left eye: Secondary | ICD-10-CM | POA: Diagnosis not present

## 2024-08-07 ENCOUNTER — Inpatient Hospital Stay: Admitting: Internal Medicine

## 2024-08-07 VITALS — BP 106/77 | HR 73 | Temp 97.3°F | Resp 17 | Ht 60.0 in | Wt 113.8 lb

## 2024-08-07 DIAGNOSIS — C7B8 Other secondary neuroendocrine tumors: Secondary | ICD-10-CM | POA: Diagnosis not present

## 2024-08-07 DIAGNOSIS — Z90710 Acquired absence of both cervix and uterus: Secondary | ICD-10-CM | POA: Diagnosis not present

## 2024-08-07 DIAGNOSIS — M810 Age-related osteoporosis without current pathological fracture: Secondary | ICD-10-CM | POA: Diagnosis not present

## 2024-08-07 DIAGNOSIS — Z902 Acquired absence of lung [part of]: Secondary | ICD-10-CM | POA: Diagnosis not present

## 2024-08-07 DIAGNOSIS — Z79899 Other long term (current) drug therapy: Secondary | ICD-10-CM | POA: Diagnosis not present

## 2024-08-07 DIAGNOSIS — C7A00 Malignant carcinoid tumor of unspecified site: Secondary | ICD-10-CM | POA: Diagnosis not present

## 2024-08-07 DIAGNOSIS — Z85118 Personal history of other malignant neoplasm of bronchus and lung: Secondary | ICD-10-CM | POA: Diagnosis not present

## 2024-08-07 DIAGNOSIS — C7A8 Other malignant neuroendocrine tumors: Secondary | ICD-10-CM | POA: Diagnosis not present

## 2024-08-07 NOTE — Progress Notes (Signed)
 [facility] [phone]   Fax:([phone]) [phone]  OFFICE PROGRESS NOTE  [name], MD [address] [place] [address]  DIAGNOSIS:  1) stage Ib (T2 a, N0, M0) non-small cell lung cancer, adenocarcinoma predominantly acinar with lipidic features diagnosed in August 2020. Molecular studies showed negative for EGFR mutation and negative PD-L1 expression. 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   PRIOR THERAPY:  Status post right upper lobectomy with lymph node dissection in [place]  [facility]).   CURRENT THERAPY: Observation.  INTERVAL HISTORY: Jacqueline Hunt 89 y.o. female returns to the clinic today for 60-month follow-up visit.Discussed the use of AI scribe software for clinical note transcription with the patient, who gave verbal consent to proceed.  History of Present Illness Jacqueline Hunt is a 89 year old female with stage 1B adenocarcinoma and a well-differentiated neuroendocrine tumor who presents for evaluation and repeat blood work and CT scan.  She was diagnosed with stage 1B adenocarcinoma in August 2020 and underwent a right upper lobectomy with lymph node dissection. She also has a well-differentiated neuroendocrine tumor of gastrointestinal primary with liver involvement, diagnosed in December 2022, and is currently under observation. Recent blood work showed a slight increase in chromogranin levels. Her chemistry and blood count were reported as normal. She is not currently on any treatment for her conditions.  Over the past six months, she has experienced alternating constipation and diarrhea, which she attributes to dietary changes. No abdominal pain, nausea, vomiting, hot flashes, or night sweats. She experiences easy shortness of breath, a condition that has been present for some time but does not impede her daily activities.  Socially, she moved to  an independent living facility in [place] six months ago after breaking her leg. She feels settled and visits her old friends regularly. She no longer owns a car and utilizes transportation services provided by her living facility or uses Ly[employer] for travel.    MEDICAL HISTORY: Past Medical History:  Diagnosis Date   Arthritis    Brain tumor (HCC)    Constipation due to opioid therapy    hospitalized d/t constipation   Dyspnea    High blood pressure    Lung cancer (HCC)    Osteopenia    Osteoporosis    Parathyroid adenoma     ALLERGIES:  has no known allergies.  MEDICATIONS:  Current Outpatient Medications  Medication Sig Dispense Refill   acetaminophen (TYLENOL) 650 MG CR tablet Take 650 mg by mouth as needed.     amLODipine  (NORVASC ) 5 MG tablet Take 1 tablet (5 mg total) by mouth daily. 90 tablet 0   aspirin EC 81 MG tablet Take 81 mg by mouth daily.     carvedilol  (COREG ) 3.125 MG tablet Take 1 tablet (3.125 mg total) by mouth 2 (two) times daily. 180 tablet 0   Cholecalciferol (VITAMIN D3 PO) Take 500 mg by mouth daily.     estradiol  (CLIMARA  - DOSED IN MG/24 HR) 0.05 mg/24hr patch PLACE 1 PATCH (0.05 MG TOTAL) ONTO THE SKIN ONCE A WEEK. 12 patch 3   latanoprost (XALATAN) 0.005 % ophthalmic solution Place 1 drop into both eyes at bedtime.     mirtazapine  (REMERON ) 7.5 MG tablet TAKE 1 TABLET BY MOUTH AT BEDTIME. 90 tablet 1   Multiple Vitamins-Minerals (DAILY MULTIVITAMIN PO) Take 1 tablet by mouth daily.     rosuvastatin  (CRESTOR ) 5 MG tablet TAKE 1 TABLET (5 MG  TOTAL) BY MOUTH DAILY. 90 tablet 1   vitamin B-12 (CYANOCOBALAMIN) 500 MCG tablet Take 500 mcg by mouth daily.     lisinopril  (ZESTRIL ) 10 MG tablet Take 1 tablet (10 mg total) by mouth 2 (two) times daily. 180 tablet 1   lisinopril  (ZESTRIL ) 10 MG tablet Take 1 tablet (10 mg total) by mouth 2 (two) times daily. 180 tablet 1   No current facility-administered medications for this visit.    SURGICAL HISTORY:   Past Surgical History:  Procedure Laterality Date   brain meningioma excesion     CATARACT EXTRACTION     CESAREAN SECTION     GALLBLADDER SURGERY     gallbladder surgery      LOBECTOMY     TONSILLECTOMY     TONSILLECTOMY     WRIST FRACTURE SURGERY      REVIEW OF SYSTEMS:  A comprehensive review of systems was negative except for: Constitutional: positive for fatigue Respiratory: positive for dyspnea on exertion Gastrointestinal: positive for change in bowel habits   PHYSICAL EXAMINATION: General appearance: alert, cooperative, and no distress Head: Normocephalic, without obvious abnormality, atraumatic Neck: no adenopathy, no JVD, supple, symmetrical, trachea midline, and thyroid not enlarged, symmetric, no tenderness/mass/nodules Lymph nodes: Cervical, supraclavicular, and axillary nodes normal. Resp: clear to auscultation bilaterally Back: symmetric, no curvature. ROM normal. No CVA tenderness. Cardio: regular rate and rhythm, S1, S2 normal, no murmur, click, rub or gallop GI: soft, non-tender; bowel sounds normal; no masses,  no organomegaly Extremities: extremities normal, atraumatic, no cyanosis or edema  ECOG PERFORMANCE STATUS: 1 - Symptomatic but completely ambulatory  Blood pressure 106/77, pulse 73, temperature (!) 97.3 F (36.3 C), resp. rate 17, height 5' (1.524 m), weight 113 lb 12.8 oz (51.6 kg), SpO2 99%.  LABORATORY DATA: Lab Results  Component Value Date   WBC 5.9 07/30/2024   HGB 13.7 07/30/2024   HCT 40.6 07/30/2024   MCV 90.2 07/30/2024   PLT 185 07/30/2024      Chemistry      Component Value Date/Time   NA 140 07/30/2024 1042   K 4.0 07/30/2024 1042   CL 102 07/30/2024 1042   CO2 33 (H) 07/30/2024 1042   BUN 20 07/30/2024 1042   BUN 20 03/12/2020 0000   CREATININE 0.83 07/30/2024 1042   CREATININE 0.67 08/30/2020 1512   GLU 179 03/12/2020 0000      Component Value Date/Time   CALCIUM  9.1 07/30/2024 1042   ALKPHOS 66 07/30/2024 1042    AST 17 07/30/2024 1042   ALT 22 07/30/2024 1042   BILITOT 0.4 07/30/2024 1042       RADIOGRAPHIC STUDIES: CT Chest W Contrast Result Date: 08/02/2024 CLINICAL DATA:  History of metastatic neuroendocrine carcinoma, follow-up. * Tracking Code: BO * EXAM: CT CHEST, ABDOMEN, AND PELVIS WITH CONTRAST TECHNIQUE: Multidetector CT imaging of the chest, abdomen and pelvis was performed following the standard protocol during bolus administration of intravenous contrast. RADIATION DOSE REDUCTION: This exam was performed according to the departmental dose-optimization program which includes automated exposure control, adjustment of the mA and/or kV according to patient size and/or use of iterative reconstruction technique. CONTRAST:  80mL OMNIPAQUE  IOHEXOL  300 MG/ML  SOLN COMPARISON:  Multiple priors including CT January 21, 2024 FINDINGS: CT CHEST FINDINGS Cardiovascular: Aneurysmal dilation of the ascending thoracic aorta measures 4.2 cm, unchanged. Tortuous descending thoracic aorta. Aortic atherosclerosis. Three-vessel coronary artery calcifications. Biatrial cardiac enlargement. No significant pericardial effusion/thickening. Mediastinum/Nodes: Similar sub/pericentimeter bilateral thyroid nodules which require no independent imaging  follow-up. Similar rightward shift of the mediastinum. No pathologically enlarged mediastinal, hilar or axillary lymph nodes. Lungs/Pleura: Similar masslike scarring in the right lung apex status post right upper lobectomy. Tracheal diverticulum. Central airways are patent. Stable left upper lobe pleuroparenchymal scarring. Again seen are areas of mucoid impaction and scarring in the right middle and right lower lobes. Stable 3 mm left upper lobe pulmonary nodule on image 37/7. Decreased clustered nodularity in the right lung base now measuring up to 5 mm and more linear in morphology on image 104/7. Similar posterior pleural nodularity in the right lower lobe measuring 7 mm on image  59/7. Stable 3 mm ground-glass nodule in the right upper lobe on image 43/7. No new suspicious pulmonary nodules or masses. Musculoskeletal: No aggressive lytic or blastic lesion of bone. Stable sclerotic focus in the T11 vertebral body on image 103/6. Levoconvex curvature of the thoracolumbar spine with associated degenerative change. CT ABDOMEN PELVIS FINDINGS Hepatobiliary: Bilobar hepatic metastasis. Indexed lesions are as follows: -lesion in the dome of the liver measures 2.1 cm on image 47/2 previously 15 mm -posterior right hepatic lobe lesion measures 2.4 x 1.8 cm on image 52/2 previously 2.5 x 1.6 cm -lesion in the central right lobe of the liver measures 15 mm on image 61/2 previously 14 mm. Pseudo cirrhotic appearance of the hepatic capsule. Gallbladder is surgically absent. Similar mild prominence of the biliary tree. Pancreas: No pancreatic ductal dilation or evidence of acute inflammation. Spleen: No splenomegaly. Adrenals/Urinary Tract: Nodular thickening along the anterior left adrenal gland partially obscured by motion on prior examination measures 10 mm on image 60/2 this is stable in retrospect dating back to CT May 25, 2022 and was not radiotracer avid on prior DOTATATE CT demonstrating loss of signal on out of phase imaging on MRI October 28, 2021 compatible with a benign adenoma. Right adrenal gland appears normal. No hydronephrosis. Kidneys demonstrate symmetric enhancement. Urinary bladder is unremarkable for degree of distension. Stomach/Bowel: Wall thickening versus underdistention of the gastric antrum on image 74/2, consider further evaluation by upper endoscopy. Wall thickening of the sigmoid colon and rectum with loss of haustral folds involving the sigmoid colon for instance on image 92/2. No evidence of bowel obstruction. Vascular/Lymphatic: Aortic atherosclerosis.  Smooth IVC contours. Slight interval increase in size of the dominant mesenteric lymph node/mass now measuring 2.9 x  2.4 cm on image 83/2 located anterior to the bifurcation of the aorta previously 2.5 x 2.2 cm. Reproductive: Status post hysterectomy. No adnexal masses. Other: Trace pelvic free fluid and mesenteric stranding. Musculoskeletal: No aggressive lytic or blastic lesion of bone. Levoconvex curvature of the thoracolumbar spine with associated degenerative change. Surgical fixation hardware in the proximal right femur. IMPRESSION: 1. Slight interval increase in size of the dominant mesenteric lymph node/mass now measuring 2.9 x 2.4 cm previously 2.5 x 2.2 cm. 2. Bilobar hepatic metastasis, some of which are slightly increased in size. 3. Decreased clustered nodularity in the right lung base now measuring up to 5 mm and more linear in morphology, favored to reflect an infectious or inflammatory process. 4. Wall thickening of the sigmoid colon and rectum with loss of haustral folds involving the sigmoid colon, suggestive of a nonspecific colitis. 5. Wall thickening versus underdistention of the gastric antrum, consider further evaluation by upper endoscopy. 6. Stable aneurysmal dilation of the ascending thoracic aorta measuring 4.2 cm. 7. Aortic atherosclerosis. Aortic Atherosclerosis (ICD10-I70.0). Electronically Signed   By: Reyes Holder M.D.   On: 08/02/2024 07:33  CT ABDOMEN PELVIS W CONTRAST Result Date: 08/02/2024 CLINICAL DATA:  History of metastatic neuroendocrine carcinoma, follow-up. * Tracking Code: BO * EXAM: CT CHEST, ABDOMEN, AND PELVIS WITH CONTRAST TECHNIQUE: Multidetector CT imaging of the chest, abdomen and pelvis was performed following the standard protocol during bolus administration of intravenous contrast. RADIATION DOSE REDUCTION: This exam was performed according to the departmental dose-optimization program which includes automated exposure control, adjustment of the mA and/or kV according to patient size and/or use of iterative reconstruction technique. CONTRAST:  80mL OMNIPAQUE  IOHEXOL  300  MG/ML  SOLN COMPARISON:  Multiple priors including CT January 21, 2024 FINDINGS: CT CHEST FINDINGS Cardiovascular: Aneurysmal dilation of the ascending thoracic aorta measures 4.2 cm, unchanged. Tortuous descending thoracic aorta. Aortic atherosclerosis. Three-vessel coronary artery calcifications. Biatrial cardiac enlargement. No significant pericardial effusion/thickening. Mediastinum/Nodes: Similar sub/pericentimeter bilateral thyroid nodules which require no independent imaging follow-up. Similar rightward shift of the mediastinum. No pathologically enlarged mediastinal, hilar or axillary lymph nodes. Lungs/Pleura: Similar masslike scarring in the right lung apex status post right upper lobectomy. Tracheal diverticulum. Central airways are patent. Stable left upper lobe pleuroparenchymal scarring. Again seen are areas of mucoid impaction and scarring in the right middle and right lower lobes. Stable 3 mm left upper lobe pulmonary nodule on image 37/7. Decreased clustered nodularity in the right lung base now measuring up to 5 mm and more linear in morphology on image 104/7. Similar posterior pleural nodularity in the right lower lobe measuring 7 mm on image 59/7. Stable 3 mm ground-glass nodule in the right upper lobe on image 43/7. No new suspicious pulmonary nodules or masses. Musculoskeletal: No aggressive lytic or blastic lesion of bone. Stable sclerotic focus in the T11 vertebral body on image 103/6. Levoconvex curvature of the thoracolumbar spine with associated degenerative change. CT ABDOMEN PELVIS FINDINGS Hepatobiliary: Bilobar hepatic metastasis. Indexed lesions are as follows: -lesion in the dome of the liver measures 2.1 cm on image 47/2 previously 15 mm -posterior right hepatic lobe lesion measures 2.4 x 1.8 cm on image 52/2 previously 2.5 x 1.6 cm -lesion in the central right lobe of the liver measures 15 mm on image 61/2 previously 14 mm. Pseudo cirrhotic appearance of the hepatic capsule.  Gallbladder is surgically absent. Similar mild prominence of the biliary tree. Pancreas: No pancreatic ductal dilation or evidence of acute inflammation. Spleen: No splenomegaly. Adrenals/Urinary Tract: Nodular thickening along the anterior left adrenal gland partially obscured by motion on prior examination measures 10 mm on image 60/2 this is stable in retrospect dating back to CT May 25, 2022 and was not radiotracer avid on prior DOTATATE CT demonstrating loss of signal on out of phase imaging on MRI October 28, 2021 compatible with a benign adenoma. Right adrenal gland appears normal. No hydronephrosis. Kidneys demonstrate symmetric enhancement. Urinary bladder is unremarkable for degree of distension. Stomach/Bowel: Wall thickening versus underdistention of the gastric antrum on image 74/2, consider further evaluation by upper endoscopy. Wall thickening of the sigmoid colon and rectum with loss of haustral folds involving the sigmoid colon for instance on image 92/2. No evidence of bowel obstruction. Vascular/Lymphatic: Aortic atherosclerosis.  Smooth IVC contours. Slight interval increase in size of the dominant mesenteric lymph node/mass now measuring 2.9 x 2.4 cm on image 83/2 located anterior to the bifurcation of the aorta previously 2.5 x 2.2 cm. Reproductive: Status post hysterectomy. No adnexal masses. Other: Trace pelvic free fluid and mesenteric stranding. Musculoskeletal: No aggressive lytic or blastic lesion of bone. Levoconvex curvature of the thoracolumbar spine  with associated degenerative change. Surgical fixation hardware in the proximal right femur. IMPRESSION: 1. Slight interval increase in size of the dominant mesenteric lymph node/mass now measuring 2.9 x 2.4 cm previously 2.5 x 2.2 cm. 2. Bilobar hepatic metastasis, some of which are slightly increased in size. 3. Decreased clustered nodularity in the right lung base now measuring up to 5 mm and more linear in morphology, favored to  reflect an infectious or inflammatory process. 4. Wall thickening of the sigmoid colon and rectum with loss of haustral folds involving the sigmoid colon, suggestive of a nonspecific colitis. 5. Wall thickening versus underdistention of the gastric antrum, consider further evaluation by upper endoscopy. 6. Stable aneurysmal dilation of the ascending thoracic aorta measuring 4.2 cm. 7. Aortic atherosclerosis. Aortic Atherosclerosis (ICD10-I70.0). Electronically Signed   By: Reyes Holder M.D.   On: 08/02/2024 07:33     ASSESSMENT AND PLAN: This is a very pleasant 89 years old white female diagnosed with stage Ib (T2 a, N0, M0) non-small cell lung cancer, adenocarcinoma predominantly acinar with lipidic features diagnosed in August 2020 status post right upper lobectomy with lymph node dissection in [place]  [facility]). Molecular studies showed negative for EGFR mutation and negative PD-L1 expression. The patient is currently on observation and she is feeling fine today with no concerning complaints. Her recent scan of the chest showed no concerning findings for disease recurrence or progression in the chest but there was suspicious liver lesion that was seen on previous PET scan when she was in [place]  [facility] and they were not hypermetabolic but still concerning.  I recommended for the patient to have MRI of the liver for evaluation of this lesion and to rule out metastatic disease. I ordered MRI of the liver that was performed recently and unfortunately showed concerning findings for liver metastasis. The patient underwent ultrasound-guided core biopsy of one of the liver lesion and the final pathology was consistent with well-differentiated neuroendocrine carcinoma of gastrointestinal primary. She was seen by second opinion by Dr. Markham at [facility] center and he recommended for the patient to consider PET Dotatate and treatment with octreotide after the scan if she becomes symptomatic. The  patient is currently on observation. She had repeat CT scan of the chest, abdomen pelvis performed recently.  I personally independently reviewed the scan and discussed the result with the patient today.  Her scan showed mild changes concerning for disease progression but no aggressive disease so far.  She is currently asymptomatic. Assessment and Plan Assessment & Plan Neuroendocrine tumor of gastrointestinal tract with liver involvement Well-differentiated neuroendocrine tumor with liver involvement. Chromogranin levels have increased slightly but are not significantly elevated. No concerning symptoms at present. Given her age and lack of symptoms, treatment is not recommended as it may cause more harm than benefit. - Continue observation.  She also mentions that she is not interested in treatment anyway. - Repeat blood work and CT scan in six months.  Stage 1B lung adenocarcinoma post right upper lobectomy Status post right upper lobectomy with lymph node dissection for stage 1B lung adenocarcinoma, diagnosed in August 2020. No new symptoms or concerning findings on recent evaluation. - Continue routine follow-up.  Chronic dyspnea on exertion Chronic dyspnea on exertion, well-managed and not impeding daily activities. No new or worsening symptoms reported.  Chronic constipation and diarrhea Alternating constipation and diarrhea, likely related to dietary factors. No associated abdominal pain or other concerning symptoms reported. She was advised to call immediately if she  has any concerning symptoms in the interval.  The patient voices understanding of current disease status and treatment options and is in agreement with the current care plan.  All questions were answered. The patient knows to call the clinic with any problems, questions or concerns. We can certainly see the patient much sooner if necessary.  The total time spent in the appointment was 20 minutes.  Disclaimer: This note  was dictated with voice recognition software. Similar sounding words can inadvertently be transcribed and may not be corrected upon review.

## 2024-08-11 DIAGNOSIS — M6281 Muscle weakness (generalized): Secondary | ICD-10-CM | POA: Diagnosis not present

## 2024-08-11 DIAGNOSIS — R2689 Other abnormalities of gait and mobility: Secondary | ICD-10-CM | POA: Diagnosis not present

## 2024-08-14 DIAGNOSIS — M6281 Muscle weakness (generalized): Secondary | ICD-10-CM | POA: Diagnosis not present

## 2024-08-14 DIAGNOSIS — R2689 Other abnormalities of gait and mobility: Secondary | ICD-10-CM | POA: Diagnosis not present

## 2024-08-18 ENCOUNTER — Telehealth: Payer: Self-pay

## 2024-08-18 DIAGNOSIS — Z23 Encounter for immunization: Secondary | ICD-10-CM

## 2024-08-18 NOTE — Telephone Encounter (Unsigned)
 Copied from CRM [id number]. Topic: Clinical - Medical Advice >> Aug 18, 2024 12:51 PM Merlynn A wrote: Reason for CRM: Patient called in stating RX is not needed.

## 2024-08-18 NOTE — Telephone Encounter (Signed)
 RX's sent electronically/fax  Refer to chart review- Misc Reports- Chart review:Routing History for documentation

## 2024-08-18 NOTE — Telephone Encounter (Addendum)
 Patient called and states that she's an independent living resident with [place] at [facility]. She stated that she needs prescriptions for Influenza vaccine, and Covid vaccine faxed to [phone]. She was notified by the Concierge Kaylee  that patients have to provide prescription for vaccines. Both have been e-faxed to requested location. Message sent to provider [name], MD as FYI

## 2024-08-18 NOTE — Telephone Encounter (Signed)
 Already sent Rx to facility

## 2024-08-20 ENCOUNTER — Ambulatory Visit (INDEPENDENT_AMBULATORY_CARE_PROVIDER_SITE_OTHER): Admitting: Sports Medicine

## 2024-08-20 ENCOUNTER — Encounter: Payer: Self-pay | Admitting: Sports Medicine

## 2024-08-20 VITALS — BP 118/82 | HR 100 | Temp 97.9°F | Ht 60.0 in | Wt 115.0 lb

## 2024-08-20 DIAGNOSIS — I10 Essential (primary) hypertension: Secondary | ICD-10-CM

## 2024-08-20 DIAGNOSIS — C7B8 Other secondary neuroendocrine tumors: Secondary | ICD-10-CM

## 2024-08-20 DIAGNOSIS — C7A8 Other malignant neuroendocrine tumors: Secondary | ICD-10-CM

## 2024-08-20 DIAGNOSIS — J302 Other seasonal allergic rhinitis: Secondary | ICD-10-CM

## 2024-08-20 DIAGNOSIS — C3411 Malignant neoplasm of upper lobe, right bronchus or lung: Secondary | ICD-10-CM

## 2024-08-20 DIAGNOSIS — R2689 Other abnormalities of gait and mobility: Secondary | ICD-10-CM | POA: Diagnosis not present

## 2024-08-20 DIAGNOSIS — F5101 Primary insomnia: Secondary | ICD-10-CM | POA: Diagnosis not present

## 2024-08-20 DIAGNOSIS — M6281 Muscle weakness (generalized): Secondary | ICD-10-CM | POA: Diagnosis not present

## 2024-08-20 DIAGNOSIS — M542 Cervicalgia: Secondary | ICD-10-CM | POA: Diagnosis not present

## 2024-08-20 MED ORDER — FLUTICASONE PROPIONATE 50 MCG/ACT NA SUSP: 2.0000 | Freq: Every day | NASAL | 6 refills | Status: AC

## 2024-08-20 MED ORDER — FEXOFENADINE HCL 180 MG PO TABS: 180.0000 mg | ORAL_TABLET | Freq: Every day | ORAL | 1 refills | Status: AC

## 2024-08-20 NOTE — Progress Notes (Addendum)
 Careteam: Patient Care Team: [name], MD as PCP - General (Internal Medicine) Loni Soyla LABOR, MD as PCP - Cardiology (Cardiology) Levora Maeola CROME (Podiatry)  PLACE OF SERVICE:  [facility]  Advanced Directive information    No Known Allergies  Chief Complaint  Patient presents with   Medical Management of Chronic Issues    3 month follow up  Patient was given COVID and FLU yesterday 08/19/2024 at [facility] where patient lives.      Discussed the use of AI scribe software for clinical note transcription with the patient, who gave verbal consent to proceed.  History of Present Illness  Jacqueline Hunt is a 89 year old female presents for follow up   She experiences numbness in her fingers, describing them as feeling like 'sponges' when pressed. There is no tingling or burning sensation, and the numbness does not affect her feet. This has impacted her ability to perform fine motor tasks such as buttoning clothes, using zippers, and putting on earrings. No weakness in her hands but notes reduced grip strength.  She has significant neck pain, which is constant and rated as a six out of ten in severity. Lidocaine  and heat provide short-lived relief. The pain does not radiate to her arms and remains localized to her neck, though movement is painful.  She has a history of lung cancer with a lobectomy. She reports that her doctor told her that her recent CT scans showed some progression in both the lung and liver, but not enough to be concerned. She experiences fatigue and shortness of breath with activity but remains active in social and volunteer activities. No chest pain or palpitations.  She reports nasal congestion and frequent nasal drainage, which she finds bothersome. She does not currently use any allergy medications or nasal sprays.    She uses a walker most of the time and has not experienced any falls since her last visit. She reports  irritability over the past week or two but denies feeling depressed. Her sleep is generally good, and she takes a low dose of Remeron  for sleep, which she feels is effective.    Review of Systems:  Review of Systems  Constitutional:  Negative for chills and fever.  HENT:  Negative for congestion and sore throat.   Respiratory:  Positive for shortness of breath (exertional , chronic). Negative for cough and sputum production.   Cardiovascular:  Negative for chest pain, palpitations and leg swelling.  Gastrointestinal:  Negative for abdominal pain, heartburn and nausea.  Genitourinary:  Negative for dysuria and frequency.  Musculoskeletal:  Positive for neck pain. Negative for falls.  Neurological:  Negative for dizziness.   Negative unless indicated in HPI.   Past Medical History:  Diagnosis Date   Arthritis    Brain tumor (HCC)    Constipation due to opioid therapy    hospitalized d/t constipation   Dyspnea    High blood pressure    Lung cancer (HCC)    Osteopenia    Osteoporosis    Parathyroid adenoma    Past Surgical History:  Procedure Laterality Date   brain meningioma excesion     CATARACT EXTRACTION     CESAREAN SECTION     GALLBLADDER SURGERY     gallbladder surgery      LOBECTOMY     TONSILLECTOMY     TONSILLECTOMY     WRIST FRACTURE SURGERY     Social History:   reports that she quit smoking  about 61 years ago. Her smoking use included cigarettes. She has never used smokeless tobacco. She reports that she does not drink alcohol and does not use drugs.  History reviewed. No pertinent family history.  Medications: Patient's Medications  New Prescriptions   No medications on file  Previous Medications   ACETAMINOPHEN (TYLENOL) 650 MG CR TABLET    Take 650 mg by mouth as needed.   AMLODIPINE  (NORVASC ) 5 MG TABLET    Take 1 tablet (5 mg total) by mouth daily.   ASPIRIN EC 81 MG TABLET    Take 81 mg by mouth daily.   CARVEDILOL  (COREG ) 3.125 MG TABLET    Take  1 tablet (3.125 mg total) by mouth 2 (two) times daily.   CHOLECALCIFEROL (VITAMIN D3 PO)    Take 500 mg by mouth daily.   COVID-19 MRNA VACCINE, PFIZER, (COMIRNATY) SYRINGE    Inject 0.3 mLs into the muscle as directed. Covid/Moderna vaccine substitute for house stock   ESTRADIOL  (CLIMARA  - DOSED IN MG/24 HR) 0.05 MG/24HR PATCH    PLACE 1 PATCH (0.05 MG TOTAL) ONTO THE SKIN ONCE A WEEK.   INFLUENZA VAC SPLIT TRIVALENT PF (FLUZONE) 0.5 ML INJECTION    Inject 0.5 mLs into the muscle once.   LATANOPROST (XALATAN) 0.005 % OPHTHALMIC SOLUTION    Place 1 drop into both eyes at bedtime.   LISINOPRIL  (ZESTRIL ) 10 MG TABLET    Take 1 tablet (10 mg total) by mouth 2 (two) times daily.   MIRTAZAPINE  (REMERON ) 7.5 MG TABLET    TAKE 1 TABLET BY MOUTH AT BEDTIME.   MULTIPLE VITAMINS-MINERALS (DAILY MULTIVITAMIN PO)    Take 1 tablet by mouth daily.   ROSUVASTATIN  (CRESTOR ) 5 MG TABLET    TAKE 1 TABLET (5 MG TOTAL) BY MOUTH DAILY.   VITAMIN B-12 (CYANOCOBALAMIN) 500 MCG TABLET    Take 500 mcg by mouth daily.  Modified Medications   No medications on file  Discontinued Medications   LISINOPRIL  (ZESTRIL ) 10 MG TABLET    Take 1 tablet (10 mg total) by mouth 2 (two) times daily.    Physical Exam: Vitals:   08/20/24 1254  BP: 118/82  Pulse: 100  Temp: 97.9 F (36.6 C)  SpO2: 93%  Weight: 115 lb (52.2 kg)  Height: 5' (1.524 m)   Body mass index is 22.46 kg/m. BP Readings from Last 3 Encounters:  08/20/24 118/82  08/07/24 106/77  05/20/24 110/86   Wt Readings from Last 3 Encounters:  08/20/24 115 lb (52.2 kg)  08/07/24 113 lb 12.8 oz (51.6 kg)  05/20/24 114 lb 6.4 oz (51.9 kg)    Physical Exam Constitutional:      Appearance: Normal appearance.  HENT:     Head: Normocephalic and atraumatic.  Cardiovascular:     Rate and Rhythm: Normal rate and regular rhythm.  Pulmonary:     Effort: Pulmonary effort is normal. No respiratory distress.     Breath sounds: Normal breath sounds. No wheezing.   Abdominal:     General: Bowel sounds are normal. There is no distension.     Tenderness: There is no abdominal tenderness. There is no guarding or rebound.     Comments:    Musculoskeletal:        General: No swelling.     Comments: Neck pain - no spinal or paraspinal tenderness Strength intact upper arms    Neurological:     Mental Status: She is alert. Mental status is at baseline.  Motor: No weakness.     Labs reviewed: Basic Metabolic Panel: Recent Labs    01/21/24 1152 07/30/24 1042  NA 140 140  K 4.2 4.0  CL 104 102  CO2 31 33*  GLUCOSE 77 101*  BUN 22 20  CREATININE 0.68 0.83  CALCIUM  9.5 9.1   Liver Function Tests: Recent Labs    01/21/24 1152 07/30/24 1042  AST 20 17  ALT 19 22  ALKPHOS 72 66  BILITOT 0.4 0.4  PROT 7.1 6.8  ALBUMIN 4.3 3.9   No results for input(s): LIPASE, AMYLASE in the last 8760 hours. No results for input(s): AMMONIA in the last 8760 hours. CBC: Recent Labs    12/12/23 1144 01/21/24 1152 07/30/24 1042  WBC 4.0 3.8* 5.9  NEUTROABS 2,640 2.4 3.9  HGB 12.3 12.1 13.7  HCT 37.1 36.9 40.6  MCV 92.8 92.7 90.2  PLT 191 145* 185   Lipid Panel: No results for input(s): CHOL, HDL, LDLCALC, TRIG, CHOLHDL, LDLDIRECT in the last 8760 hours. TSH: No results for input(s): TSH in the last 8760 hours. A1C: No results found for: HGBA1C  Assessment and Plan Assessment & Plan   1. Neck pain (Primary)  Chronic neck pain  C/o numbness in both fingers Strength intact   No tenderness on exam  Reports 6/10 pain and takes tylenol prn for pain  Will refer to occupational therapy - Ambulatory referral to Occupational Therapy  2. Malignant neoplasm of upper lobe of right lung (HCC)  3. Neuroendocrine carcinoma metastatic to liver [facility])  Followed with oncology Pt denies fevers, chills,  Weight stable  4. Primary hypertension  At goal   5. Primary insomnia  Sleeping better Cont with rameron   6.  Seasonal allergies  C/o post nasal drip  Instructed to take allegra , flonase  - fexofenadine  (ALLEGRA  ALLERGY) 180 MG tablet; Take 1 tablet (180 mg total) by mouth daily.  Dispense: 90 tablet; Refill: 1 - fluticasone  (FLONASE ) 50 MCG/ACT nasal spray; Place 2 sprays into both nostrils daily.  Dispense: 16 g; Refill: 6

## 2024-08-22 DIAGNOSIS — M6281 Muscle weakness (generalized): Secondary | ICD-10-CM | POA: Diagnosis not present

## 2024-08-22 DIAGNOSIS — R2689 Other abnormalities of gait and mobility: Secondary | ICD-10-CM | POA: Diagnosis not present

## 2024-08-26 DIAGNOSIS — R2689 Other abnormalities of gait and mobility: Secondary | ICD-10-CM | POA: Diagnosis not present

## 2024-08-26 DIAGNOSIS — M6281 Muscle weakness (generalized): Secondary | ICD-10-CM | POA: Diagnosis not present

## 2024-08-28 DIAGNOSIS — M6281 Muscle weakness (generalized): Secondary | ICD-10-CM | POA: Diagnosis not present

## 2024-08-28 DIAGNOSIS — R2689 Other abnormalities of gait and mobility: Secondary | ICD-10-CM | POA: Diagnosis not present

## 2024-09-02 ENCOUNTER — Encounter: Payer: Self-pay | Admitting: Family

## 2024-09-02 ENCOUNTER — Ambulatory Visit: Payer: Medicare HMO | Admitting: Family

## 2024-09-02 VITALS — BP 120/76 | HR 91 | Temp 97.8°F | Resp 17 | Ht 60.0 in | Wt 116.8 lb

## 2024-09-02 DIAGNOSIS — Z0000 Encounter for general adult medical examination without abnormal findings: Secondary | ICD-10-CM | POA: Diagnosis not present

## 2024-09-02 DIAGNOSIS — S7291XD Unspecified fracture of right femur, subsequent encounter for closed fracture with routine healing: Secondary | ICD-10-CM | POA: Diagnosis not present

## 2024-09-02 DIAGNOSIS — R2689 Other abnormalities of gait and mobility: Secondary | ICD-10-CM | POA: Diagnosis not present

## 2024-09-02 DIAGNOSIS — F5101 Primary insomnia: Secondary | ICD-10-CM

## 2024-09-02 DIAGNOSIS — M6281 Muscle weakness (generalized): Secondary | ICD-10-CM | POA: Diagnosis not present

## 2024-09-02 MED ORDER — MIRTAZAPINE 7.5 MG PO TABS: 7.5000 mg | ORAL_TABLET | Freq: Every day | ORAL | 1 refills | Status: AC

## 2024-09-02 NOTE — Patient Instructions (Signed)
 Jacqueline Hunt , Thank you for taking time to come for your Medicare Wellness Visit. I appreciate your ongoing commitment to your health goals. Please review the following plan we discussed and let me know if I can assist you in the future.   Screening recommendations/referrals: Colonoscopy : N/A  Mammogram : N/A  Bone Density : Up to date  Recommended yearly ophthalmology/optometry visit for glaucoma screening and checkup Recommended yearly dental visit for hygiene and checkup  Vaccinations: Influenza vaccine- due annually in September/October Pneumococcal vaccine  : Up to date  Tdap vaccine  : Up to date  Shingles vaccine  : Up to date     Advanced directives: Yes   Conditions/risks identified: advanced age (>15men, >48 women);hypertension;smoking/ tobacco exposure  Next appointment: 1 year    Preventive Care 70 Years and Older, Female Preventive care refers to lifestyle choices and visits with your health care provider that can promote health and wellness. What does preventive care include? A yearly physical exam. This is also called an annual well check. Dental exams once or twice a year. Routine eye exams. Ask your health care provider how often you should have your eyes checked. Personal lifestyle choices, including: Daily care of your teeth and gums. Regular physical activity. Eating a healthy diet. Avoiding tobacco and drug use. Limiting alcohol use. Practicing safe sex. Taking low-dose aspirin every day. Taking vitamin and mineral supplements as recommended by your health care provider. What happens during an annual well check? The services and screenings done by your health care provider during your annual well check will depend on your age, overall health, lifestyle risk factors, and family history of disease. Counseling  Your health care provider may ask you questions about your: Alcohol use. Tobacco use. Drug use. Emotional well-being. Home and relationship  well-being. Sexual activity. Eating habits. History of falls. Memory and ability to understand (cognition). Work and work Astronomer. Reproductive health. Screening  You may have the following tests or measurements: Height, weight, and BMI. Blood pressure. Lipid and cholesterol levels. These may be checked every 5 years, or more frequently if you are over 33 years old. Skin check. Lung cancer screening. You may have this screening every year starting at age 32 if you have a 30-pack-year history of smoking and currently smoke or have quit within the past 15 years. Fecal occult blood test (FOBT) of the stool. You may have this test every year starting at age 73. Flexible sigmoidoscopy or colonoscopy. You may have a sigmoidoscopy every 5 years or a colonoscopy every 10 years starting at age 16. Hepatitis C blood test. Hepatitis B blood test. Sexually transmitted disease (STD) testing. Diabetes screening. This is done by checking your blood sugar (glucose) after you have not eaten for a while (fasting). You may have this done every 1-3 years. Bone density scan. This is done to screen for osteoporosis. You may have this done starting at age 66. Mammogram. This may be done every 1-2 years. Talk to your health care provider about how often you should have regular mammograms. Talk with your health care provider about your test results, treatment options, and if necessary, the need for more tests. Vaccines  Your health care provider may recommend certain vaccines, such as: Influenza vaccine. This is recommended every year. Tetanus, diphtheria, and acellular pertussis (Tdap, Td) vaccine. You may need a Td booster every 10 years. Zoster vaccine. You may need this after age 39. Pneumococcal 13-valent conjugate (PCV13) vaccine. One dose is recommended after age  65. Pneumococcal polysaccharide (PPSV23) vaccine. One dose is recommended after age 64. Talk to your health care provider about which  screenings and vaccines you need and how often you need them. This information is not intended to replace advice given to you by your health care provider. Make sure you discuss any questions you have with your health care provider. Document Released: 12/10/2015 Document Revised: 08/02/2016 Document Reviewed: 09/14/2015 Elsevier Interactive Patient Education  2017 [employer].  Fall Prevention in the Home Falls can cause injuries. They can happen to people of all ages. There are many things you can do to make your home safe and to help prevent falls. What can I do on the outside of my home? Regularly fix the edges of walkways and driveways and fix any cracks. Remove anything that might make you trip as you walk through a door, such as a raised step or threshold. Trim any bushes or trees on the path to your home. Use bright outdoor lighting. Clear any walking paths of anything that might make someone trip, such as rocks or tools. Regularly check to see if handrails are loose or broken. Make sure that both sides of any steps have handrails. Any raised decks and porches should have guardrails on the edges. Have any leaves, snow, or ice cleared regularly. Use sand or salt on walking paths during winter. Clean up any spills in your garage right away. This includes oil or grease spills. What can I do in the bathroom? Use night lights. Install grab bars by the toilet and in the tub and shower. Do not use towel bars as grab bars. Use non-skid mats or decals in the tub or shower. If you need to sit down in the shower, use a plastic, non-slip stool. Keep the floor dry. Clean up any water that spills on the floor as soon as it happens. Remove soap buildup in the tub or shower regularly. Attach bath mats securely with double-sided non-slip rug tape. Do not have throw rugs and other things on the floor that can make you trip. What can I do in the bedroom? Use night lights. Make sure that you have a  light by your bed that is easy to reach. Do not use any sheets or blankets that are too big for your bed. They should not hang down onto the floor. Have a firm chair that has side arms. You can use this for support while you get dressed. Do not have throw rugs and other things on the floor that can make you trip. What can I do in the kitchen? Clean up any spills right away. Avoid walking on wet floors. Keep items that you use a lot in easy-to-reach places. If you need to reach something above you, use a strong step stool that has a grab bar. Keep electrical cords out of the way. Do not use floor polish or wax that makes floors slippery. If you must use wax, use non-skid floor wax. Do not have throw rugs and other things on the floor that can make you trip. What can I do with my stairs? Do not leave any items on the stairs. Make sure that there are handrails on both sides of the stairs and use them. Fix handrails that are broken or loose. Make sure that handrails are as long as the stairways. Check any carpeting to make sure that it is firmly attached to the stairs. Fix any carpet that is loose or worn. Avoid having throw rugs at  the top or bottom of the stairs. If you do have throw rugs, attach them to the floor with carpet tape. Make sure that you have a light switch at the top of the stairs and the bottom of the stairs. If you do not have them, ask someone to add them for you. What else can I do to help prevent falls? Wear shoes that: Do not have high heels. Have rubber bottoms. Are comfortable and fit you well. Are closed at the toe. Do not wear sandals. If you use a stepladder: Make sure that it is fully opened. Do not climb a closed stepladder. Make sure that both sides of the stepladder are locked into place. Ask someone to hold it for you, if possible. Clearly mark and make sure that you can see: Any grab bars or handrails. First and last steps. Where the edge of each step  is. Use tools that help you move around (mobility aids) if they are needed. These include: Canes. Walkers. Scooters. Crutches. Turn on the lights when you go into a dark area. Replace any light bulbs as soon as they burn out. Set up your furniture so you have a clear path. Avoid moving your furniture around. If any of your floors are uneven, fix them. If there are any pets around you, be aware of where they are. Review your medicines with your doctor. Some medicines can make you feel dizzy. This can increase your chance of falling. Ask your doctor what other things that you can do to help prevent falls. This information is not intended to replace advice given to you by your health care provider. Make sure you discuss any questions you have with your health care provider. Document Released: 09/09/2009 Document Revised: 04/20/2016 Document Reviewed: 12/18/2014 Elsevier Interactive Patient Education  2017 [employer].

## 2024-09-02 NOTE — Progress Notes (Signed)
 Subjective:   Jacqueline Hunt is a 89 y.o. female who presents for Medicare Annual (Subsequent) preventive examination.  Visit Complete: In person  Patient Medicare AWV questionnaire was completed by the patient on 09/02/2024; I have confirmed that all information answered by patient is correct and no changes since this date.  Cardiac Risk Factors include: advanced age (>57men, >73 women);hypertension;smoking/ tobacco exposure     Objective:    Today's Vitals   09/02/24 1126 09/02/24 1215  BP: 120/76   Pulse: 91   Resp: 17   Temp: 97.8 F (36.6 C)   SpO2: 98%   Weight: 116 lb 12.8 oz (53 kg)   Height: 5' (1.524 m)   PainSc:  7    Body mass index is 22.81 kg/m.     09/02/2024   11:12 AM 08/07/2024    3:06 PM 01/22/2024   10:56 AM 12/12/2023   11:08 AM 08/30/2023   10:46 AM 06/13/2023    9:09 AM 08/21/2022    3:11 PM  Advanced Directives  Does Patient Have a Medical Advance Directive? Yes Yes Yes No No No Yes  Type of Estate agent of [place];Living will  Healthcare Power of [place];Living will;Out of facility DNR (pink MOST or yellow form)    Healthcare Power of [place];Living will;Out of facility DNR (pink MOST or yellow form)  Does patient want to make changes to medical advance directive? No - Patient declined  No - Patient declined    No - Patient declined  Copy of Healthcare Power of Attorney in Chart? No - copy requested  No - copy requested    No - copy requested  Would patient like information on creating a medical advance directive?    No - Patient declined  No - Patient declined     Current Medications (verified) Outpatient Encounter Medications as of 09/02/2024  Medication Sig   acetaminophen (TYLENOL) 650 MG CR tablet Take 650 mg by mouth as needed.   amLODipine  (NORVASC ) 5 MG tablet Take 1 tablet (5 mg total) by mouth daily.   aspirin EC 81 MG tablet Take 81 mg by mouth daily.   carvedilol  (COREG ) 3.125 MG tablet Take 1 tablet (3.125 mg  total) by mouth 2 (two) times daily.   Cholecalciferol (VITAMIN D3 PO) Take 500 mg by mouth daily.   COVID-19 mRNA vaccine, Pfizer, (COMIRNATY) syringe Inject 0.3 mLs into the muscle as directed. Covid/Moderna vaccine substitute for house stock   estradiol  (CLIMARA  - DOSED IN MG/24 HR) 0.05 mg/24hr patch PLACE 1 PATCH (0.05 MG TOTAL) ONTO THE SKIN ONCE A WEEK.   fexofenadine  (ALLEGRA  ALLERGY) 180 MG tablet Take 1 tablet (180 mg total) by mouth daily.   fluticasone  (FLONASE ) 50 MCG/ACT nasal spray Place 2 sprays into both nostrils daily.   influenza vac split trivalent PF (FLUZONE) 0.5 ML injection Inject 0.5 mLs into the muscle once.   latanoprost (XALATAN) 0.005 % ophthalmic solution Place 1 drop into both eyes at bedtime.   lisinopril  (ZESTRIL ) 10 MG tablet Take 1 tablet (10 mg total) by mouth 2 (two) times daily.   mirtazapine  (REMERON ) 7.5 MG tablet TAKE 1 TABLET BY MOUTH AT BEDTIME.   Multiple Vitamins-Minerals (DAILY MULTIVITAMIN PO) Take 1 tablet by mouth daily.   rosuvastatin  (CRESTOR ) 5 MG tablet TAKE 1 TABLET (5 MG TOTAL) BY MOUTH DAILY.   vitamin B-12 (CYANOCOBALAMIN) 500 MCG tablet Take 500 mcg by mouth daily.   No facility-administered encounter medications on file as of 09/02/2024.  Allergies (verified) Patient has no known allergies.   History: Past Medical History:  Diagnosis Date   Arthritis    Brain tumor (HCC)    Constipation due to opioid therapy    hospitalized d/t constipation   Dyspnea    High blood pressure    Lung cancer (HCC)    Osteopenia    Osteoporosis    Parathyroid adenoma    Past Surgical History:  Procedure Laterality Date   brain meningioma excesion     CATARACT EXTRACTION     CESAREAN SECTION     GALLBLADDER SURGERY     gallbladder surgery      LOBECTOMY     TONSILLECTOMY     TONSILLECTOMY     WRIST FRACTURE SURGERY     History reviewed. No pertinent family history. Social History   Socioeconomic History   Marital status: Divorced     Spouse name: Not on file   Number of children: Not on file   Years of education: Not on file   Highest education level: Doctorate  Occupational History   Not on file  Tobacco Use   Smoking status: Former    Current packs/day: 0.00    Types: Cigarettes    Quit date: 1964    Years since quitting: 61.8   Smokeless tobacco: Never  Vaping Use   Vaping status: Never Used  Substance and Sexual Activity   Alcohol use: Never   Drug use: Never   Sexual activity: Not Currently  Other Topics Concern   Not on file  Social History Narrative   Diet: Nothing Special      Do you drink/ eat things with caffeine?  Coffe      Marital status:   Divorced                            What year were you married ? 1951      Do you live in a house, apartment,assistred living, condo, trailer, etc.)? Apartment      Is it one or more stories? First Floor      How many persons live in your home ? One      Do you have any pets in your home ?(please list) Cat      Highest Level of education completed: PhD      Current or past profession:  Former Public house manager + Professor , Social Work      Do you exercise?      Walk                        Type & how often  Just Power Walk Daily      ADVANCED DIRECTIVES (Please bring copies)      Do you have a living will? Yes      Do you have a DNR form?    Yes                   If not, do you want to discuss one?       Do you have signed POA?HPOA forms?  Yes               If so, please bring to your appointment      FUNCTIONAL STATUS- To be completed by Spouse / child / Staff       Do you have difficulty bathing or dressing yourself ? No  Do you have difficulty preparing food or eating ? No      Do you have difficulty managing your mediation ? No      Do you have difficulty managing your finances ? No      Do you have difficulty affording your medication ? No      Social Drivers of Corporate investment banker Strain: Low Risk  (05/16/2024)   Overall  Financial Resource Strain (CARDIA)    Difficulty of Paying Living Expenses: Not hard at all  Food Insecurity: No Food Insecurity (09/02/2024)   Hunger Vital Sign    Worried About Running Out of Food in the Last Year: Never true    Ran Out of Food in the Last Year: Never true  Transportation Needs: No Transportation Needs (09/02/2024)   PRAPARE - Administrator, Civil Service (Medical): No    Lack of Transportation (Non-Medical): No  Physical Activity: Inactive (05/16/2024)   Exercise Vital Sign    Days of Exercise per Week: 0 days    Minutes of Exercise per Session: Not on file  Stress: No Stress Concern Present (05/16/2024)   [employer] of Occupational Health - Occupational Stress Questionnaire    Feeling of Stress: Not at all  Social Connections: Moderately Isolated (05/16/2024)   Social Connection and Isolation Panel    Frequency of Communication with Friends and Family: More than three times a week    Frequency of Social Gatherings with Friends and Family: More than three times a week    Attends Religious Services: Never    Database administrator or Organizations: Yes    Attends Engineer, structural: More than 4 times per year    Marital Status: Divorced    Tobacco Counseling Counseling given: Not Answered   Clinical Intake:  Pre-visit preparation completed: No  Pain : 0-10 Pain Score: 7  Pain Type: Chronic pain Pain Location: Neck Pain Orientation: Left Pain Radiating Towards: No Pain Descriptors / Indicators: Aching, Sharp Pain Onset: More than a month ago Pain Frequency: Constant Pain Relieving Factors: tylenol  and heat /lidocaine  Effect of Pain on Daily Activities: No  Pain Relieving Factors: tylenol  and heat /lidocaine   BMI - recorded: 22.81 Nutritional Status: BMI of 19-24  Normal Nutritional Risks: None Diabetes: No  How often do you need to have someone help you when you read instructions, pamphlets, or other written materials  from your doctor or pharmacy?: 1 - Never What is the last grade level you completed in school?: Phd  Interpreter Needed?: No      Activities of Daily Living    09/02/2024   12:20 PM 08/30/2024    7:41 PM  In your present state of health, do you have any difficulty performing the following activities:  Hearing?  0  Vision?  0  Difficulty concentrating or making decisions?  0  Walking or climbing stairs?  0  Dressing or bathing?  0  Doing errands, shopping?  0  Preparing Food and eating ? N   Using the Toilet?  N  In the past six months, have you accidently leaked urine?  N  Do you have problems with loss of bowel control?  N  Managing your Medications?  N  Managing your Finances? N   Housekeeping or managing your Housekeeping?  N    Patient Care Team: [name], MD as PCP - General (Internal Medicine) Loni Soyla LABOR, MD as PCP - Cardiology (Cardiology) Levora Maeola CROME (  Podiatry)  Indicate any recent Medical Services you may have received from other than [facility] providers in the past year (date may be approximate).     Assessment:   This is a routine wellness examination for Iliyana.  Hearing/Vision screen Hearing Screening - Comments:: Hearing issues pt has hearing aids. Vision Screening - Comments:: Eye exam 07-03-2024    Goals Addressed             This Visit's Progress    Patient Stated       Would like to walk better        Depression Screen    09/02/2024   11:13 AM 08/20/2024    1:18 PM 08/20/2024   12:55 PM 05/20/2024    1:18 PM 12/12/2023   11:07 AM 08/30/2023   10:46 AM 12/04/2022    2:58 PM  PHQ 2/9 Scores  PHQ - 2 Score 0 0 0 0 0 0 0    Fall Risk    09/02/2024   11:13 AM 08/30/2024    7:41 PM 08/20/2024    1:18 PM 08/20/2024   12:55 PM 05/20/2024    1:18 PM  Fall Risk   Falls in the past year? 0 0 0 0 1  Number falls in past yr: 0  0 0 0  Injury with Fall? 0  1 0 1  Risk for fall due to : No Fall Risks   No Fall Risks   Follow up  Falls evaluation completed   Falls evaluation completed     MEDICARE RISK AT HOME: Medicare Risk at Home Any stairs in or around the home?: (Patient-Rptd) No If so, are there any without handrails?: (Patient-Rptd) No Home free of loose throw rugs in walkways, pet beds, electrical cords, etc?: (Patient-Rptd) No Adequate lighting in your home to reduce risk of falls?: (Patient-Rptd) Yes Life alert?: (Patient-Rptd) No Use of a cane, walker or w/c?: (Patient-Rptd) Yes Grab bars in the bathroom?: (Patient-Rptd) Yes Shower chair or bench in shower?: (Patient-Rptd) Yes Elevated toilet seat or a handicapped toilet?: (Patient-Rptd) No  TIMED UP AND GO:  Was the test performed?  Yes  Length of time to ambulate 10 feet: 15 sec Gait slow and steady with assistive device    Cognitive Function:    08/30/2023   10:49 AM  MMSE - Mini Mental State Exam  Orientation to time 5  Orientation to Place 5  Registration 3  Attention/ Calculation 5  Recall 2  Language- name 2 objects 2  Language- repeat 1  Language- follow 3 step command 3  Language- read & follow direction 1  Write a sentence 1  Copy design 1  Total score 29        09/02/2024   11:14 AM 08/21/2022    3:16 PM 08/17/2021   10:34 AM  6CIT Screen  What Year? 0 points 0 points 0 points  What month? 0 points 0 points 0 points  What time? 0 points 0 points 0 points  Count back from 20 0 points 0 points 0 points  Months in reverse 0 points 0 points 0 points  Repeat phrase 0 points 0 points 0 points  Total Score 0 points 0 points 0 points    Immunizations Immunization History  Administered Date(s) Administered    sv, Bivalent, Protein Subunit Rsvpref,pf [place]) 08/31/2022   Hepatitis A, Adult 01/09/2018, 07/09/2018   INFLUENZA, HIGH DOSE SEASONAL PF 08/26/2014, 09/24/2017, 08/14/2021   Influenza Split 09/07/2011, 10/29/2012, 08/26/2014   Influenza, Seasonal,  Injecte, Preservative Fre 09/07/2011, 10/29/2012, 08/26/2014    Influenza-Unspecified 09/21/2009, 10/28/2019, 08/31/2020, 08/17/2022, 07/26/2023, 08/19/2024   Moderna Covid-19 Vaccine Bivalent Booster 77yrs & up 03/31/2021   Moderna Sars-Covid-2 Vaccination 04/01/2022, 08/19/2024   PFIZER Comirnaty(Gray Top)Covid-19 Tri-Sucrose Vaccine 03/08/2021, 08/14/2021   PFIZER(Purple Top)SARS-COV-2 Vaccination 12/16/2019, 01/05/2020, 08/17/2020, 03/08/2021   Pfizer Covid-19 Vaccine Bivalent Booster 5y-11y 08/17/2022   Pneumococcal Conjugate-13 02/25/2014   Pneumococcal Polysaccharide-23 01/15/2007   Td (Adult),5 Lf Tetanus Toxid, Preservative Free 11/29/2017   Td,absorbed, Preservative Free, Adult Use, Lf Unspecified 11/29/2017   Tdap 11/29/2017   Zoster Recombinant(Shingrix) 03/26/2017, 06/24/2017   Zoster, Live 11/06/2011    TDAP status: Up to date  Flu Vaccine status: Up to date  Pneumococcal vaccine status: Up to date  Covid-19 vaccine status: Information provided on how to obtain vaccines.   Qualifies for Shingles Vaccine? Yes   Zostavax completed No   Shingrix Completed?: Yes  Screening Tests Health Maintenance  Topic Date Due   COVID-19 Vaccine (11 - 2025-26 season) 10/14/2024   Medicare Annual Wellness (AWV)  09/02/2025   DTaP/Tdap/Td (4 - Td or Tdap) 11/30/2027   Pneumococcal Vaccine: 50+ Years  Completed   Influenza Vaccine  Completed   DEXA SCAN  Completed   Zoster Vaccines- Shingrix  Completed   Meningococcal B Vaccine  Aged Out    Health Maintenance  There are no preventive care reminders to display for this patient.   Colorectal cancer screening: No longer required.   Mammogram status: No longer required due to due to advance age .  Bone Density status: Completed 04/28/2021. Results reflect: Bone density results: OSTEOPOROSIS. Repeat every 2 years.  Lung Cancer Screening: (Low Dose CT Chest recommended if Age 62-80 years, 20 pack-year currently smoking OR have quit w/in 15years.) does not qualify.   Lung Cancer Screening  Referral: N/A   Additional Screening:  Hepatitis C Screening: does not qualify; Completed N/a   Vision Screening: Recommended annual ophthalmology exams for early detection of glaucoma and other disorders of the eye. Is the patient up to date with their annual eye exam?  Yes  Who is the provider or what is the name of the office in which the patient attends annual eye exams? Dr.Bowen  If pt is not established with a provider, would they like to be referred to a provider to establish care? No .   Dental Screening: Recommended annual dental exams for proper oral hygiene  Diabetic Foot Exam: Diabetic Foot Exam: Completed N/A   Community Resource Referral / Chronic Care Management: CRR required this visit?  No   CCM required this visit?  No     Plan:     I have personally reviewed and noted the following in the patient's chart:   Medical and social history Use of alcohol, tobacco or illicit drugs  Current medications and supplements including opioid prescriptions. Patient is not currently taking opioid prescriptions. Functional ability and status Nutritional status Physical activity Advanced directives List of other physicians Hospitalizations, surgeries, and ER visits in previous 12 months Vitals Screenings to include cognitive, depression, and falls Referrals and appointments  In addition, I have reviewed and discussed with patient certain preventive protocols, quality metrics, and best practice recommendations. A written personalized care plan for preventive services as well as general preventive health recommendations were provided to patient.     Roxan JAYSON Plough, NP   09/02/2024   After Visit Summary: (In Person-Printed) AVS printed and given to the patient  Nurse Notes: Up to date

## 2024-09-04 DIAGNOSIS — M6281 Muscle weakness (generalized): Secondary | ICD-10-CM | POA: Diagnosis not present

## 2024-09-04 DIAGNOSIS — R2689 Other abnormalities of gait and mobility: Secondary | ICD-10-CM | POA: Diagnosis not present

## 2024-09-09 DIAGNOSIS — M6281 Muscle weakness (generalized): Secondary | ICD-10-CM | POA: Diagnosis not present

## 2024-09-09 DIAGNOSIS — R2689 Other abnormalities of gait and mobility: Secondary | ICD-10-CM | POA: Diagnosis not present

## 2024-09-11 DIAGNOSIS — M6281 Muscle weakness (generalized): Secondary | ICD-10-CM | POA: Diagnosis not present

## 2024-09-11 DIAGNOSIS — R2689 Other abnormalities of gait and mobility: Secondary | ICD-10-CM | POA: Diagnosis not present

## 2024-09-16 DIAGNOSIS — M6281 Muscle weakness (generalized): Secondary | ICD-10-CM | POA: Diagnosis not present

## 2024-09-16 DIAGNOSIS — R2689 Other abnormalities of gait and mobility: Secondary | ICD-10-CM | POA: Diagnosis not present

## 2024-09-25 DIAGNOSIS — R2689 Other abnormalities of gait and mobility: Secondary | ICD-10-CM | POA: Diagnosis not present

## 2024-09-25 DIAGNOSIS — M6281 Muscle weakness (generalized): Secondary | ICD-10-CM | POA: Diagnosis not present

## 2024-09-29 DIAGNOSIS — R2689 Other abnormalities of gait and mobility: Secondary | ICD-10-CM | POA: Diagnosis not present

## 2024-09-29 DIAGNOSIS — M6281 Muscle weakness (generalized): Secondary | ICD-10-CM | POA: Diagnosis not present

## 2024-10-02 DIAGNOSIS — H401131 Primary open-angle glaucoma, bilateral, mild stage: Secondary | ICD-10-CM | POA: Diagnosis not present

## 2024-10-02 DIAGNOSIS — H524 Presbyopia: Secondary | ICD-10-CM | POA: Diagnosis not present

## 2024-10-02 DIAGNOSIS — H348122 Central retinal vein occlusion, left eye, stable: Secondary | ICD-10-CM | POA: Diagnosis not present

## 2024-10-03 DIAGNOSIS — M6281 Muscle weakness (generalized): Secondary | ICD-10-CM | POA: Diagnosis not present

## 2024-10-03 DIAGNOSIS — R2689 Other abnormalities of gait and mobility: Secondary | ICD-10-CM | POA: Diagnosis not present

## 2024-10-08 DIAGNOSIS — M6281 Muscle weakness (generalized): Secondary | ICD-10-CM | POA: Diagnosis not present

## 2024-10-08 DIAGNOSIS — R2689 Other abnormalities of gait and mobility: Secondary | ICD-10-CM | POA: Diagnosis not present

## 2024-10-09 DIAGNOSIS — M6281 Muscle weakness (generalized): Secondary | ICD-10-CM | POA: Diagnosis not present

## 2024-10-09 DIAGNOSIS — R2689 Other abnormalities of gait and mobility: Secondary | ICD-10-CM | POA: Diagnosis not present

## 2024-10-15 DIAGNOSIS — M6281 Muscle weakness (generalized): Secondary | ICD-10-CM | POA: Diagnosis not present

## 2024-10-15 DIAGNOSIS — R2689 Other abnormalities of gait and mobility: Secondary | ICD-10-CM | POA: Diagnosis not present

## 2024-10-20 DIAGNOSIS — M6281 Muscle weakness (generalized): Secondary | ICD-10-CM | POA: Diagnosis not present

## 2024-10-20 DIAGNOSIS — R278 Other lack of coordination: Secondary | ICD-10-CM | POA: Diagnosis not present

## 2024-10-20 DIAGNOSIS — R448 Other symptoms and signs involving general sensations and perceptions: Secondary | ICD-10-CM | POA: Diagnosis not present

## 2024-10-21 ENCOUNTER — Encounter: Payer: Self-pay | Admitting: Internal Medicine

## 2024-10-21 ENCOUNTER — Ambulatory Visit: Attending: Internal Medicine | Admitting: Internal Medicine

## 2024-10-21 VITALS — BP 95/65 | HR 90 | Ht 59.0 in | Wt 119.1 lb

## 2024-10-21 DIAGNOSIS — I952 Hypotension due to drugs: Secondary | ICD-10-CM | POA: Diagnosis not present

## 2024-10-21 DIAGNOSIS — C3411 Malignant neoplasm of upper lobe, right bronchus or lung: Secondary | ICD-10-CM | POA: Diagnosis not present

## 2024-10-21 DIAGNOSIS — Z79899 Other long term (current) drug therapy: Secondary | ICD-10-CM | POA: Diagnosis not present

## 2024-10-21 DIAGNOSIS — R000 Tachycardia, unspecified: Secondary | ICD-10-CM

## 2024-10-21 DIAGNOSIS — I441 Atrioventricular block, second degree: Secondary | ICD-10-CM | POA: Diagnosis not present

## 2024-10-21 DIAGNOSIS — E782 Mixed hyperlipidemia: Secondary | ICD-10-CM | POA: Diagnosis not present

## 2024-10-21 DIAGNOSIS — I454 Nonspecific intraventricular block: Secondary | ICD-10-CM

## 2024-10-21 DIAGNOSIS — I10 Essential (primary) hypertension: Secondary | ICD-10-CM

## 2024-10-21 DIAGNOSIS — R002 Palpitations: Secondary | ICD-10-CM

## 2024-10-21 NOTE — Progress Notes (Signed)
 Cardiology Office Note:  .   Date:  10/21/2024  ID:  Jacqueline Hunt, DOB [birth year], MRN [id number] PCP: Leonarda Roxan BROCKS, NP  [facility] Providers Cardiologist:  Soyla DELENA Merck, MD    History of Present Illness: .   Jacqueline Hunt is a 89 y.o. female.  Discussed the use of AI scribe software for clinical note transcription with the patient, who gave verbal consent to proceed.  History of Present Illness Jacqueline Hunt is a 89 year old female who presents for cardiovascular evaluation due to new EKG findings of interventricular conduction delay. Past medical history of lung and liver malignancy being followed.  She has second degree AV block, Mobitz 1, that had been stable, but a recent EKG now shows an interventricular conduction delay with resolution of the AV block pattern. Her 2024 echocardiogram was reported to her as reassuring overall.  She has hypercholesterolemia and hypertension treated with lisinopril  10 mg twice daily, amlodipine  5 mg daily, and carvedilol  3.125 mg twice daily. She has had no medication issues. Her blood pressure today is 95/65 mmHg, which is new for her. She does not recall prior low blood pressures and denies significant dizziness, though she occasionally feels mildly dizzy after physical therapy.  She has liver and lung malignancy, including thyroid and stage IV liver cancer, is followed by oncology, and is not a candidate for aggressive therapy, with palliative care planned if needed. She is currently asymptomatic from her cancers and performs all daily activities.  She fell a year ago, sustained a leg fracture, and completed surgery and rehabilitation. She now lives in an independent living facility and walks at her baseline.  She denies chest pain and significant shortness of breath since her last visit. She has mild exertional dyspnea only.    ROS: negative except per HPI above.  Studies Reviewed: SABRA   EKG  Interpretation Date/Time:  Tuesday October 21 2024 10:39:12 EST Ventricular Rate:  90 PR Interval:  156 QRS Duration:  128 QT Interval:  400 QTC Calculation: 489 R Axis:   31  Text Interpretation: Normal sinus rhythm Non-specific intra-ventricular conduction block T wave abnormality, consider inferior ischemia T wave abnormality, consider anterolateral ischemia conduction delay is new compared to prior Confirmed by Shayne Diguglielmo ([id number]) on 10/21/2024 10:49:24 AM    Results DIAGNOSTIC EKG: Interventricular conduction delay, no second degree AV block, evidence of underlying conduction disease (10/21/2024) Echocardiogram: Moderate tricuspid valve regurgitation, normal biventricular size and function (2024) Risk Assessment/Calculations:       Physical Exam:   VS:  BP 95/65 (BP Location: Left Arm, Patient Position: Sitting)   Pulse 90   Ht 4' 11 (1.499 m)   Wt 119 lb 1.6 oz (54 kg)   SpO2 95%   BMI 24.06 kg/m    Wt Readings from Last 3 Encounters:  10/21/24 119 lb 1.6 oz (54 kg)  09/02/24 116 lb 12.8 oz (53 kg)  08/20/24 115 lb (52.2 kg)     Physical Exam VITALS: BP- 95/65 GENERAL: Alert, cooperative, well developed, no acute distress HEENT: Normocephalic, normal oropharynx, moist mucous membranes CHEST: Clear to auscultation bilaterally, no wheezes, rhonchi, or crackles CARDIOVASCULAR: Normal heart rate and rhythm, S1 and S2 normal without murmurs ABDOMEN: Soft, non-tender, non-distended, without organomegaly, normal bowel sounds EXTREMITIES: No cyanosis or edema NEUROLOGICAL: Cranial nerves grossly intact, moves all extremities without gross motor or sensory deficit   ASSESSMENT AND PLAN: .    Assessment and Plan Assessment & Plan Interventricular conduction delay (bundle  branch block) with history of second degree AV block, Mobitz 1 Med mgmt EKG shows interventricular conduction delay with underlying conduction disease. Previous Mobitz 1 block normalized.  Conduction slowing possibly exacerbated by carvedilol . - Discontinued carvedilol  to prevent falls and complications. - Scheduled follow-up EKG in three months.  Iatrogenic hypotension due to antihypertensive therapy Blood pressure at 95/65 mmHg, likely due to carvedilol . Goal to prevent falls and maintain adequate blood pressure. - Discontinued carvedilol  to improve blood pressure. - Encouraged hydration and considered electrolyte drinks or increased dietary salt.  Hypertension Managed with lisinopril  10 mg BID and amlodipine  5 mg daily. Focus on preventing hypotension. - Continue lisinopril  and amlodipine  as prescribed.  Metastatic liver cancer under surveillance No current symptoms. Prefers palliative care if symptoms develop. - Continue surveillance with oncology.  Primary lung cancer (status post partial resection, under surveillance) Under surveillance with no current symptoms. - Continue surveillance with oncology.  Thyroid cancer under surveillance Under surveillance with no current symptoms. - Continue surveillance with oncology.  Dizziness, likely multifactorial Possibly related to hypotension or cardiac conduction changes. No significant dizziness reported. - Monitor dizziness and blood pressure, especially after physical therapy. - Encouraged hydration to mitigate dizziness.        Soyla Merck, MD, FACC

## 2024-10-21 NOTE — Patient Instructions (Signed)
 Medication Instructions:  Stop: Carvedilol  (Coreg )  Lab Work: None  Follow-Up: At [facility], you and your health needs are our priority.  As part of our continuing mission to provide you with exceptional heart care, our providers are all part of one team.  This team includes your primary Cardiologist (physician) and Advanced Practice Providers or APPs (Physician Assistants and Nurse Practitioners) who all work together to provide you with the care you need, when you need it.  Your next appointment:   3 month(s) (due around Feb 2026)  Provider:   Gayatri A Acharya, MD or Dayna Dunn, PA-C, Callie Goodrich, PA-C, Hao Meng, PA-C, Damien Braver, NP, or Glendia Ferrier, PA-C         Other Instructions Please call us  or send a MyChart message with any Cardiology related questions/concerns.  [phone].  Thank you!   Please check your Blood pressure if you are feeling dizzy and/or lightheaded. Please notify us . Thank you

## 2024-10-25 ENCOUNTER — Other Ambulatory Visit: Payer: Self-pay | Admitting: Internal Medicine

## 2024-10-26 ENCOUNTER — Other Ambulatory Visit: Payer: Self-pay | Admitting: Internal Medicine

## 2024-10-29 DIAGNOSIS — R2689 Other abnormalities of gait and mobility: Secondary | ICD-10-CM | POA: Diagnosis not present

## 2024-10-29 DIAGNOSIS — M6281 Muscle weakness (generalized): Secondary | ICD-10-CM | POA: Diagnosis not present

## 2024-10-31 ENCOUNTER — Telehealth: Payer: Self-pay

## 2024-10-31 MED ORDER — AMLODIPINE BESYLATE 5 MG PO TABS: 5.0000 mg | ORAL_TABLET | Freq: Every day | ORAL | 0 refills | Status: AC

## 2024-10-31 NOTE — Telephone Encounter (Signed)
 Copied from CRM #[id number]. Topic: Clinical - Prescription Issue >> Oct 31, 2024  3:39 PM DeAngela L wrote: Reason for CRM: patient calling after [facility] asked her to reach out to the office and inform them that they have sent a request for a new prescription for amLODipine  (NORVASC ) 5 MG tablet And have had a response back form the office as soon as possible   Pt num [phone] (M)   [id number] - [place], [place] - [address] AT [address] [facility] [address] [place] [address] Phone: [phone] Fax: [phone]

## 2024-10-31 NOTE — Telephone Encounter (Signed)
Rx has been sent to pharamcy

## 2024-11-04 ENCOUNTER — Ambulatory Visit: Payer: Self-pay

## 2024-11-04 DIAGNOSIS — U071 COVID-19: Secondary | ICD-10-CM | POA: Diagnosis not present

## 2024-11-04 NOTE — Telephone Encounter (Signed)
 FYI Only or Action Required?: FYI only for provider: pt opted for UC due to no appts until tomorrow .  Patient was last seen in primary care on 09/02/2024 by Ngetich, Roxan BROCKS, NP.  Called Nurse Triage reporting Covid Positive and Dizziness.  Symptoms began several days ago.  Interventions attempted: Nothing.  Symptoms are: unchanged.  Triage Disposition: See HCP Within 4 Hours (Or PCP Triage)  Patient/caregiver understands and will follow disposition?: Yes     Copied from CRM #[id number]. Topic: Clinical - Red Word Triage >> Nov 04, 2024  2:55 PM Chiquita SQUIBB wrote: Red Word that prompted transfer to Nurse Triage: Patient is calling in stating that she is on her 6th day of covid and still experiencing dizziness, nausea and other symptoms she didn't state. Patient would like to be seen. Reason for Disposition  MILD difficulty breathing (e.g., minimal/no SOB at rest, SOB with walking, pulse < 100)  Answer Assessment - Initial Assessment Questions Pt stated she has had covid x 6 days and wants and appt. Rn began asking questions and pt stated are you going to see me or not.. Rn advised questions are asked to provide best care for her and rule out anything urgent or emergent. Pt stated understanding. Pt began to have symptoms on Friday, tested positive on Friday at home. Pt has been having dizziness, sore throat, nausea, mild shortness of breath. Denies chest pain or fevers. Dizziness is worse with standing, denies low BP.    1. SYMPTOMS: What is your main symptom or concern? (e.g., cough, fever, shortness of breath, muscle aches)     Dizziness, shortness of breath, sore throat, nausea 2. ONSET: When did the symptoms start?      Friday 3. FEVER: Do you have a fever? If Yes, ask: What is your temperature, how was it measured, and when did it start?     denies 4. BREATHING DIFFICULTY: Are you having any difficulty breathing? (e.g., normal; shortness of breath, wheezing, unable to  speak)      Mild shortness of breath 5. OTHER SYMPTOMS: Do you have any other symptoms?  (e.g., chills, fatigue, headache, loss of smell or taste, muscle pain, sore throat)     Sore throat, dizziness 6. COVID-19 DIAGNOSIS: How do you know that you have COVID? (e.g., positive lab test or self-test, diagnosed by doctor or NP/PA, symptoms after exposure).     Home test 7. HIGH RISK DISEASE: Do you have any chronic medical problems? (e.g., asthma, heart or lung disease, weak immune system, obesity, etc.)       Hx of cancer  Answer Assessment - Initial Assessment Questions 1. DESCRIPTION: Describe your dizziness.     lightheaded 2. LIGHTHEADED: Do you feel lightheaded? (e.g., somewhat faint, woozy, weak upon standing)     yes 3. VERTIGO: Do you feel like either you or the room is spinning or tilting? (i.e., vertigo)     no 4. SEVERITY: How bad is it?  Do you feel like you are going to faint? Can you stand and walk?     Can stand but has been using something to walk 5. ONSET:  When did the dizziness begin?     Covid + on Friday 6. AGGRAVATING FACTORS: Does anything make it worse? (e.g., standing, change in head position)     Standing makes it worse 7. CAUSE: What do you think is causing the dizziness? (e.g., decreased fluids or food, diarrhea, emotional distress, heat exposure, new medicine, sudden standing, vomiting; unknown)  Thinks covid, states she is drinking plenty of fluids 8. OTHER SYMPTOMS: Do you have any other symptoms? (e.g., fever, chest pain, vomiting, diarrhea, bleeding)       Sore throat, nausea  Protocols used: Dizziness - Lightheadedness-A-AH, COVID-19 - Diagnosed or Suspected-A-AH

## 2024-11-04 NOTE — Telephone Encounter (Signed)
 I agree with recommendation for evaluation in the urgent care.

## 2024-11-12 DIAGNOSIS — H34232 Retinal artery branch occlusion, left eye: Secondary | ICD-10-CM | POA: Diagnosis not present

## 2024-11-12 DIAGNOSIS — H401131 Primary open-angle glaucoma, bilateral, mild stage: Secondary | ICD-10-CM | POA: Diagnosis not present

## 2024-11-12 DIAGNOSIS — H43812 Vitreous degeneration, left eye: Secondary | ICD-10-CM | POA: Diagnosis not present

## 2024-11-12 DIAGNOSIS — H3562 Retinal hemorrhage, left eye: Secondary | ICD-10-CM | POA: Diagnosis not present

## 2024-11-12 DIAGNOSIS — H348121 Central retinal vein occlusion, left eye, with retinal neovascularization: Secondary | ICD-10-CM | POA: Diagnosis not present

## 2024-11-12 DIAGNOSIS — H34812 Central retinal vein occlusion, left eye, with macular edema: Secondary | ICD-10-CM | POA: Diagnosis not present

## 2024-11-12 DIAGNOSIS — H35352 Cystoid macular degeneration, left eye: Secondary | ICD-10-CM | POA: Diagnosis not present

## 2024-12-03 ENCOUNTER — Ambulatory Visit: Payer: Self-pay | Admitting: Family

## 2024-12-16 ENCOUNTER — Ambulatory Visit: Admitting: Internal Medicine

## 2024-12-16 ENCOUNTER — Encounter: Payer: Self-pay | Admitting: Internal Medicine

## 2024-12-16 VITALS — BP 124/84 | HR 103 | Temp 97.6°F | Ht 59.0 in | Wt 117.4 lb

## 2024-12-16 DIAGNOSIS — G4700 Insomnia, unspecified: Secondary | ICD-10-CM

## 2024-12-16 DIAGNOSIS — Z85118 Personal history of other malignant neoplasm of bronchus and lung: Secondary | ICD-10-CM

## 2024-12-16 DIAGNOSIS — C7B02 Secondary carcinoid tumors of liver: Secondary | ICD-10-CM | POA: Diagnosis not present

## 2024-12-16 DIAGNOSIS — M810 Age-related osteoporosis without current pathological fracture: Secondary | ICD-10-CM

## 2024-12-16 DIAGNOSIS — C7A8 Other malignant neuroendocrine tumors: Secondary | ICD-10-CM | POA: Diagnosis not present

## 2024-12-16 DIAGNOSIS — I10 Essential (primary) hypertension: Secondary | ICD-10-CM

## 2024-12-16 DIAGNOSIS — E782 Mixed hyperlipidemia: Secondary | ICD-10-CM | POA: Diagnosis not present

## 2024-12-16 DIAGNOSIS — C7B8 Other secondary neuroendocrine tumors: Secondary | ICD-10-CM | POA: Diagnosis not present

## 2024-12-17 ENCOUNTER — Encounter: Payer: Self-pay | Admitting: Internal Medicine

## 2024-12-17 MED ORDER — ESTRADIOL 0.05 MG/24HR TD PTWK: 0.0500 mg | MEDICATED_PATCH | TRANSDERMAL | 3 refills | Status: AC

## 2024-12-17 NOTE — Progress Notes (Signed)
 "   New Patient Office Visit     CC/Reason for Visit: Establish care, discuss chronic medical conditions, medication refills Previous PCP: [facility] Last Visit: Unknown  HPI: Jacqueline Hunt is a 89 y.o. female who is coming in today for the above mentioned reasons. Past Medical History is significant for: Hypertension, hyperlipidemia, seasonal allergies, insomnia, history of lung cancer status post lobectomy, history of neuroendocrine carcinoma.  He is being managed by oncology with observation only.  She takes estradiol  for postmenopausal vasomotor symptoms and is requesting a refill.  She has 3 grown children.  She lives at [facility].   Past Medical/Surgical History: Past Medical History:  Diagnosis Date   Arthritis    Brain tumor (HCC)    Constipation due to opioid therapy    hospitalized d/t constipation   Dyspnea    High blood pressure    Lung cancer (HCC)    Osteopenia    Osteoporosis    Parathyroid adenoma     Past Surgical History:  Procedure Laterality Date   brain meningioma excesion     CATARACT EXTRACTION     CESAREAN SECTION     GALLBLADDER SURGERY     gallbladder surgery      LOBECTOMY     TONSILLECTOMY     TONSILLECTOMY     WRIST FRACTURE SURGERY      Social History:  reports that she quit smoking about 62 years ago. Her smoking use included cigarettes. She has never used smokeless tobacco. She reports that she does not drink alcohol and does not use drugs.  Allergies: Allergies[1]  Family History:  History reviewed. No pertinent family history.  Current Medications[2]  Review of Systems:  Negative except as indicated in HPI.   Physical Exam: Vitals:   12/16/24 1509  BP: 124/84  Pulse: (!) 103  Temp: 97.6 F (36.4 C)  TempSrc: Oral  SpO2: 98%  Weight: 117 lb 6.4 oz (53.3 kg)  Height: 4' 11 (1.499 m)   Body mass index is 23.71 kg/m.  Physical Exam Vitals reviewed.  Constitutional:      Appearance: Normal  appearance.  HENT:     Head: Normocephalic and atraumatic.  Eyes:     Conjunctiva/sclera: Conjunctivae normal.  Cardiovascular:     Rate and Rhythm: Normal rate and regular rhythm.  Pulmonary:     Effort: Pulmonary effort is normal.     Breath sounds: Normal breath sounds.  Skin:    General: Skin is warm and dry.  Neurological:     General: No focal deficit present.     Mental Status: She is alert and oriented to person, place, and time.  Psychiatric:        Mood and Affect: Mood normal.        Behavior: Behavior normal.        Thought Content: Thought content normal.        Judgment: Judgment normal.       Impression and Plan:  History of lung cancer  Senile osteoporosis -     Estradiol ; Place 1 patch (0.05 mg total) onto the skin once a week.  Dispense: 12 patch; Refill: 3  Primary hypertension  Insomnia, unspecified type  Neuroendocrine carcinoma metastatic to liver (HCC)  Mixed hyperlipidemia   - Blood pressure is well-controlled on current. - Last LDL was well-controlled at 55. - I will refill estradiol  that she uses for bone health and vasomotor symptoms. - Continue expectant management with oncology.  Time spent:  33 minutes reviewing chart, interviewing and examining patient and formulating plan of care.        Tully Theophilus Andrews, MD [facility]    [1] No Known Allergies [2]  Current Outpatient Medications:    acetaminophen (TYLENOL) 650 MG CR tablet, Take 650 mg by mouth as needed., Disp: , Rfl:    amLODipine  (NORVASC ) 5 MG tablet, Take 1 tablet (5 mg total) by mouth daily., Disp: 90 tablet, Rfl: 0   aspirin EC 81 MG tablet, Take 81 mg by mouth daily., Disp: , Rfl:    Cholecalciferol (VITAMIN D3 PO), Take 500 mg by mouth daily., Disp: , Rfl:    fexofenadine  (ALLEGRA  ALLERGY) 180 MG tablet, Take 1 tablet (180 mg total) by mouth daily., Disp: 90 tablet, Rfl: 1   fluticasone  (FLONASE ) 50 MCG/ACT nasal spray, Place 2  sprays into both nostrils daily., Disp: 16 g, Rfl: 6   influenza vac split trivalent PF (FLUZONE) 0.5 ML injection, Inject 0.5 mLs into the muscle once., Disp: , Rfl:    latanoprost (XALATAN) 0.005 % ophthalmic solution, Place 1 drop into both eyes at bedtime., Disp: , Rfl:    lisinopril  (ZESTRIL ) 10 MG tablet, Take 1 tablet (10 mg total) by mouth 2 (two) times daily., Disp: 180 tablet, Rfl: 1   mirtazapine  (REMERON ) 7.5 MG tablet, Take 1 tablet (7.5 mg total) by mouth at bedtime., Disp: 90 tablet, Rfl: 1   Multiple Vitamins-Minerals (DAILY MULTIVITAMIN PO), Take 1 tablet by mouth daily., Disp: , Rfl:    rosuvastatin  (CRESTOR ) 5 MG tablet, TAKE 1 TABLET (5 MG TOTAL) BY MOUTH DAILY., Disp: 90 tablet, Rfl: 1   vitamin B-12 (CYANOCOBALAMIN) 500 MCG tablet, Take 500 mcg by mouth daily., Disp: , Rfl:    estradiol  (CLIMARA  - DOSED IN MG/24 HR) 0.05 mg/24hr patch, Place 1 patch (0.05 mg total) onto the skin once a week., Disp: 12 patch, Rfl: 3  "

## 2024-12-18 ENCOUNTER — Ambulatory Visit: Admitting: Family Medicine

## 2024-12-18 ENCOUNTER — Ambulatory Visit: Payer: Self-pay

## 2024-12-18 VITALS — BP 118/70 | HR 100 | Temp 97.6°F | Ht 59.0 in | Wt 117.0 lb

## 2024-12-18 DIAGNOSIS — N3001 Acute cystitis with hematuria: Secondary | ICD-10-CM

## 2024-12-18 DIAGNOSIS — R319 Hematuria, unspecified: Secondary | ICD-10-CM

## 2024-12-18 LAB — POCT URINALYSIS DIPSTICK
Bilirubin, UA: POSITIVE
Blood, UA: POSITIVE
Glucose, UA: NEGATIVE
Ketones, UA: NEGATIVE
Nitrite, UA: NEGATIVE
Protein, UA: POSITIVE — AB
Spec Grav, UA: 1.025
Urobilinogen, UA: 0.2 U/dL
pH, UA: 5

## 2024-12-18 MED ORDER — CEPHALEXIN 500 MG PO CAPS: 500.0000 mg | ORAL_CAPSULE | Freq: Two times a day (BID) | ORAL | 0 refills | Status: DC

## 2024-12-18 NOTE — Patient Instructions (Signed)
-  It was a pleasure to care for you today.  -Urinalysis completed today with signs you may have a urinary tract infection despite only having only symptoms of blood in urine.  -Will send urine sample for a culture. Office will call with lab results and will be available on MyChart.  -Discussed about starting an antibiotic versus waiting until culture is complete. Through shared decision making, decided to start antibiotic. Prescribed Keflex  500mg  tablet, take 1 tablet every 12 hours for 7 days.  -Discussed about reason for emergent care if symptoms become worse, especially with having an upcoming winter storm.  -Recommend to hydrate with water.

## 2024-12-18 NOTE — Progress Notes (Signed)
 "  Established Patient Office Visit   Subjective:  Patient ID: Jacqueline Hunt, female    DOB: Dec 29, 1927  Age: 89 y.o. MRN: [id number]  Chief Complaint  Patient presents with   Hematuria    HPI:  Patient is here for an acute visit. Patients primary care provider is Dr. RONAL Nap.  She is complaining of hematuria. Bright red.  Denies increase in urgency and frequency to urinate, no abd pain, no back pain, nausea, vomiting, or fever.   Started this morning.  Review of Systems  Genitourinary:  Positive for hematuria.   See HPI above     Objective:   BP 118/70   Pulse 100   Temp 97.6 F (36.4 C) (Oral)   Ht 4' 11 (1.499 m)   Wt 117 lb (53.1 kg)   SpO2 98%   BMI 23.63 kg/m    Physical Exam Vitals reviewed.  Constitutional:      General: She is not in acute distress.    Appearance: Normal appearance. She is normal weight. She is not ill-appearing, toxic-appearing or diaphoretic.  HENT:     Head: Normocephalic and atraumatic.  Eyes:     General:        Right eye: No discharge.        Left eye: No discharge.     Conjunctiva/sclera: Conjunctivae normal.  Cardiovascular:     Rate and Rhythm: Normal rate and regular rhythm.     Heart sounds: Normal heart sounds. No murmur heard.    No friction rub. No gallop.  Pulmonary:     Effort: Pulmonary effort is normal. No respiratory distress.     Breath sounds: Normal breath sounds.  Abdominal:     General: Bowel sounds are normal. There is no distension.     Palpations: Abdomen is soft. There is no mass.     Tenderness: There is no abdominal tenderness.  Musculoskeletal:        General: Normal range of motion.  Skin:    General: Skin is warm and dry.  Neurological:     General: No focal deficit present.     Mental Status: She is alert and oriented to person, place, and time. Mental status is at baseline.  Psychiatric:        Mood and Affect: Mood normal.        Behavior: Behavior normal.        Thought Content:  Thought content normal.        Judgment: Judgment normal.     Latest Reference Range & Units 12/18/24 15:51  Bilirubin, UA  positive  Clarity, UA  cloudy  Color, UA  brown  Glucose Negative  Negative  Ketones, UA  negative  Leukocytes,UA Negative  Small (1+) !  Nitrite, UA  negative  pH, UA 5.0 - 8.0  5.0  Protein,UA Negative  Positive !  Specific Gravity, UA 1.010 - 1.025  1.025  Urobilinogen, UA 0.2 or 1.0 E.U./dL 0.2  RBC, UA  positive  !: Data is abnormal   Assessment & Plan:  Acute cystitis with hematuria -     Cephalexin ; Take 1 capsule (500 mg total) by mouth 2 (two) times daily for 7 days.  Dispense: 14 capsule; Refill: 0  Hematuria, unspecified type -     POCT urinalysis dipstick -     Urine Culture  -Urinalysis completed today with signs she may have a urinary tract infection despite only having only symptoms of blood in urine. Leukocytes and protein present.  -  Will send urine sample for a culture. Office will call with lab results and will be available on MyChart.  -Discussed about starting an antibiotic versus waiting until culture is complete. Through shared decision making, decided to start antibiotic. Prescribed Keflex  500mg  tablet, take 1 tablet every 12 hours for 7 days.  -Discussed about reason for emergent care if symptoms become worse, especially with having an upcoming winter storm.  -Recommend to hydrate with water.   Sequoyah Counterman, NP "

## 2024-12-18 NOTE — Telephone Encounter (Signed)
 Noted.

## 2024-12-18 NOTE — Telephone Encounter (Signed)
 FYI Only or Action Required?: FYI only for provider: appointment scheduled on 12/18/24.  Patient was last seen in primary care on 12/16/2024 by Theophilus Andrews, Tully GRADE, MD.  Called Nurse Triage reporting Hematuria.  Symptoms began today.  Interventions attempted: Nothing.  Symptoms are: stable.  Triage Disposition: See Physician Within 24 Hours  Patient/caregiver understands and will follow disposition?: Yes  Reason for Disposition  Blood in urine  (Exception: Could be normal menstrual bleeding.)  Answer Assessment - Initial Assessment Questions Pt's reports having thyroid and lung cancer, stage 4 liver cancer, she is unsure if hematuria is related to that. Educated patient on typical signs of UTI and liver cancer. She denies any dysuria, fever, flank pain or urinary frequency.   1. COLOR of URINE: Describe the color of the urine.  (e.g., tea-colored, pink, red, bloody) Do you have blood clots in your urine? (e.g., none, pea, grape, small coin)     Bright red  2. ONSET: When did the bleeding start?      This morning  3. EPISODES: How many times has there been blood in the urine? or How many times today?     Once  4. PAIN with URINATION: Is there any pain with passing your urine? If Yes, ask: How bad is the pain?  (Scale 1-10; or mild, moderate, severe)     Denies  5. FEVER: Do you have a fever? If Yes, ask: What is your temperature, how was it measured, and when did it start?     Denies  6. ASSOCIATED SYMPTOMS: Are you passing urine more frequently than usual?     Denies  7. OTHER SYMPTOMS: Do you have any other symptoms? (e.g., back/flank pain, abdomen pain, vomiting)     Denies  Protocols used: Urine - Blood In-A-AH  Message from Tanazia G sent at 12/18/2024 10:04 AM EST  Summary: Red Word: Blood in Urine (cancer patient)   Reason for Triage: Patient states she has a medical questions. She said she has blood in her urine, patient has cancer and  she is wondering if she should see a doctor. Denies burning, irritation, states that the bleeding is light.

## 2024-12-20 LAB — URINE CULTURE
MICRO NUMBER:: 17502736
Result:: NO GROWTH
SPECIMEN QUALITY:: ADEQUATE

## 2024-12-22 ENCOUNTER — Ambulatory Visit: Payer: Self-pay | Admitting: Family Medicine

## 2024-12-25 ENCOUNTER — Encounter: Payer: Self-pay | Admitting: Internal Medicine

## 2024-12-25 ENCOUNTER — Encounter: Admitting: Internal Medicine

## 2024-12-25 VITALS — BP 110/74 | HR 105 | Temp 97.7°F

## 2024-12-25 DIAGNOSIS — R319 Hematuria, unspecified: Secondary | ICD-10-CM

## 2024-12-25 LAB — POC URINALSYSI DIPSTICK (AUTOMATED)
Blood, UA: NEGATIVE
Glucose, UA: NEGATIVE
Ketones, UA: NEGATIVE
Leukocytes, UA: NEGATIVE
Nitrite, UA: NEGATIVE
Protein, UA: POSITIVE — AB
Spec Grav, UA: 1.025
Urobilinogen, UA: 0.2 U/dL
pH, UA: 6

## 2024-12-25 NOTE — Progress Notes (Signed)
 This encounter was created in error - please disregard.

## 2025-01-21 ENCOUNTER — Ambulatory Visit: Payer: Self-pay | Admitting: Internal Medicine

## 2025-01-27 ENCOUNTER — Inpatient Hospital Stay: Payer: Self-pay

## 2025-01-27 ENCOUNTER — Other Ambulatory Visit (HOSPITAL_COMMUNITY)

## 2025-02-05 ENCOUNTER — Inpatient Hospital Stay: Payer: Self-pay | Admitting: Internal Medicine

## 2025-06-17 ENCOUNTER — Ambulatory Visit: Admitting: Internal Medicine

## 2025-09-03 ENCOUNTER — Ambulatory Visit: Payer: Self-pay | Admitting: Family
